# Patient Record
Sex: Female | Born: 1960
Health system: Southern US, Community
[De-identification: ages and names within clinical notes are randomized; demographics above are authoritative.]

## PROBLEM LIST (undated history)

## (undated) DIAGNOSIS — I639 Cerebral infarction, unspecified: Secondary | ICD-10-CM

## (undated) DIAGNOSIS — K219 Gastro-esophageal reflux disease without esophagitis: Secondary | ICD-10-CM

## (undated) DIAGNOSIS — I10 Essential (primary) hypertension: Secondary | ICD-10-CM

## (undated) HISTORY — DX: Gastro-esophageal reflux disease without esophagitis: K21.9

## (undated) HISTORY — DX: Cerebral infarction, unspecified: I63.9

---

## 2005-11-08 DIAGNOSIS — I639 Cerebral infarction, unspecified: Secondary | ICD-10-CM

## 2005-11-08 HISTORY — DX: Cerebral infarction, unspecified: I63.9

## 2008-11-08 HISTORY — PX: TRIGGER FINGER RELEASE: SHX641

## 2013-11-08 HISTORY — PX: FOOT SURGERY: SHX648

## 2015-06-04 ENCOUNTER — Emergency Department (HOSPITAL_COMMUNITY)
Admission: EM | Admit: 2015-06-04 | Discharge: 2015-06-04 | Disposition: A | Discharge status: Home / Self Care | Payer: 59 | Attending: Emergency Medicine | Admitting: Emergency Medicine

## 2015-06-04 ENCOUNTER — Encounter (HOSPITAL_COMMUNITY): Payer: Self-pay | Admitting: *Deleted

## 2015-06-04 DIAGNOSIS — R51 Headache: Secondary | ICD-10-CM | POA: Insufficient documentation

## 2015-06-04 DIAGNOSIS — Z79899 Other long term (current) drug therapy: Secondary | ICD-10-CM | POA: Diagnosis not present

## 2015-06-04 DIAGNOSIS — I10 Essential (primary) hypertension: Secondary | ICD-10-CM

## 2015-06-04 LAB — BASIC METABOLIC PANEL
Anion gap: 6 (ref 5–15)
BUN: 17 mg/dL (ref 6–20)
CO2: 25 mmol/L (ref 22–32)
CREATININE: 0.72 mg/dL (ref 0.44–1.00)
Calcium: 9.4 mg/dL (ref 8.9–10.3)
Chloride: 107 mmol/L (ref 101–111)
GLUCOSE: 88 mg/dL (ref 65–99)
POTASSIUM: 4.3 mmol/L (ref 3.5–5.1)
Sodium: 138 mmol/L (ref 135–145)

## 2015-06-04 LAB — CBC
HCT: 36.9 % (ref 36.0–46.0)
HEMOGLOBIN: 12.3 g/dL (ref 12.0–15.0)
MCH: 31.9 pg (ref 26.0–34.0)
MCHC: 33.3 g/dL (ref 30.0–36.0)
MCV: 95.8 fL (ref 78.0–100.0)
PLATELETS: 257 10*3/uL (ref 150–400)
RBC: 3.85 MIL/uL — ABNORMAL LOW (ref 3.87–5.11)
RDW: 12.8 % (ref 11.5–15.5)
WBC: 5.8 10*3/uL (ref 4.0–10.5)

## 2015-06-04 MED ORDER — IRBESARTAN 150 MG PO TABS
150.0000 mg | ORAL_TABLET | Freq: Every day | ORAL | Status: DC
  Administered 2015-06-04: 150 mg via ORAL
  Filled 2015-06-04: qty 1

## 2015-06-04 MED ORDER — VALSARTAN-HYDROCHLOROTHIAZIDE 160-12.5 MG PO TABS: 1.0000 | ORAL_TABLET | Freq: Every day | ORAL | Status: DC

## 2015-06-04 MED ORDER — HYDROCHLOROTHIAZIDE 12.5 MG PO CAPS
12.5000 mg | ORAL_CAPSULE | Freq: Every day | ORAL | Status: DC
  Administered 2015-06-04: 12.5 mg via ORAL
  Filled 2015-06-04: qty 1

## 2015-06-04 MED ORDER — ACETAMINOPHEN 500 MG PO TABS
1000.0000 mg | ORAL_TABLET | Freq: Once | ORAL | Status: AC
  Administered 2015-06-04: 1000 mg via ORAL

## 2015-06-04 MED ORDER — VALSARTAN-HYDROCHLOROTHIAZIDE 160-12.5 MG PO TABS: 1.0000 | ORAL_TABLET | ORAL | Status: DC

## 2015-06-04 NOTE — Discharge Instructions (Signed)
°Emergency Department Resource Guide °1) Find a Doctor and Pay Out of Pocket °Although you won't have to find out who is covered by your insurance plan, it is a good idea to ask around and get recommendations. You will then need to call the office and see if the doctor you have chosen will accept you as a new patient and what types of options they offer for patients who are self-pay. Some doctors offer discounts or will set up payment plans for their patients who do not have insurance, but you will need to ask so you aren't surprised when you get to your appointment. ° °2) Contact Your Local Health Department °Not all health departments have doctors that can see patients for sick visits, but many do, so it is worth a call to see if yours does. If you don't know where your local health department is, you can check in your phone book. The CDC also has a tool to help you locate your state's health department, and many state websites also have listings of all of their local health departments. ° °3) Find a Walk-in Clinic °If your illness is not likely to be very severe or complicated, you may want to try a walk in clinic. These are popping up all over the country in pharmacies, drugstores, and shopping centers. They're usually staffed by nurse practitioners or physician assistants that have been trained to treat common illnesses and complaints. They're usually fairly quick and inexpensive. However, if you have serious medical issues or chronic medical problems, these are probably not your best option. ° °No Primary Care Doctor: °- Call [facility] at  832-8000 - they can help you locate a primary care doctor that  accepts your insurance, provides certain services, etc. °- Physician Referral Service- 1-800-533-3463 ° °Chronic Pain Problems: °Organization         Address  Phone   Notes  °[facility]  (336) 297-2271 Patients need to be referred by their primary care doctor.  ° °Medication  Assistance: °Organization         Address  Phone   Notes  °[place] [facility] [address] °[place], [place] [address] (336) 641-8030 --Must be a resident of [place] °-- Must have NO insurance coverage whatsoever (no Medicaid/ Medicare, etc.) °-- The pt. MUST have a primary care doctor that directs their care regularly and follows them in the community °  °[facility]  (866) 331-1348   °[employer]  (888) 892-1162   ° °Agencies that provide inexpensive medical care: °Organization         Address  Phone   Notes  °[facility]  (336) 832-8035   °[facility]    (336) 832-7272   °[facility] [address] °[place], [place] [address] (336) 832-4777   °[facility] [address], °[place] (336) 271-4999   °[facility]    (336) 373-0678   °[facility]    (336) 272-1050   °[facility] ° [address], [place] Phone:  (336) 832-4444, Fax:  (336) 832-4440 Hours of Operation:  9 am - 6 pm, M-F.  Also accepts Medicaid/Medicare and self-pay.  °[facility] ° [address], [place] Phone: (336) 832-3150, Fax: (336) 832-3151. Hours of Operation:  8:30 am - 5:30 pm, M-F.  Also accepts Medicaid and self-pay.  °[facility] [place] [address], [place] Phone: (336) 878-6027   °[facility] [address], [place], [place] (336)723-1848, Ext. 123 Mondays & Thursdays: 7-9 AM.  First 15 patients are seen on a first come, first serve basis. °  ° °Medicaid-accepting [place] Providers: ° °Organization         Address  Phone   Notes  °[facility] [address], [place] (336) 641-2100 Also accepts self-pay patients.  °[facility] [address], [place] ° (336) 856-9996   °[facility] [address], [place]  (336) 288-8857   °[facility] [address] [place] [address], [place] (336) 299-7000   °Veita Bland [address], [place]  ° (336) 373-1557 Only accepts [place] Access Medicaid patients after they have their name applied to their card.  ° °Self-Pay (no insurance) in [place]: ° °Organization         Address  Phone   Notes  °Sickle Cell Patients, [facility] [address], [place] (336) 832-1970   °[facility] [address], [place] (336) 832-4400   ° [facility] ° [address], [place] (336) 992-4800   °[facility]/Dr. Osei-Bonsu ° [address] [place] [address], [place] or [address], [place] (336) 841-8500 Phone number for both [place] and [place] locations is the same.  °[facility] [address], [place] (336) 299-0000   °[facility] [address] [place] [address], [place] or [address] (336) 852-7530 °(336) 878-2260   °[facility] [address], [place] (336) 350-1642, phone; (336) 294-5005, fax Sees patients 1st and 3rd Saturday of every month.  Must not qualify for public or private insurance (i.e. Medicaid, Medicare, [place] Health Choice, Veterans' Benefits) • Household income should be no more than 200% of the poverty level •The clinic cannot treat you if you are pregnant or think you are pregnant • Sexually transmitted diseases are not treated at the clinic.  ° ° °Dental Care: °Organization         Address  Phone  Notes  °[place] [facility] [address], [place] (336) 641-6152 Accepts children up to age 21 who are enrolled in Medicaid or [place] Health Choice; pregnant women with a Medicaid card; and children who have applied for Medicaid or [place] Health Choice, but were declined, whose parents can pay a reduced fee at time of service.  °[place]  [facility] [place]  [address], [place] (336) 641-7733 Accepts children up to age 21 who are enrolled in Medicaid or [place] Health Choice; pregnant women with a Medicaid card; and children who have applied for Medicaid or [place] Health Choice, but were declined, whose parents can pay a reduced fee at time of service.  °[facility] ° [address], [place] (336) 641-4533 Patients are seen by appointment only. Walk-ins are not accepted. [facility] will see patients 18 years of age and older. °Monday - Tuesday (8am-5pm) °Most Wednesdays (8:30-5pm) °$30 per visit, cash only  °[facility] ° [address], [place] (336) 641-4533 Patients are seen by appointment only. Walk-ins are not accepted. [facility] will see patients 18 years of age and older. °One   Wednesday Evening (Monthly: Volunteer Based).  $30 per visit, cash only  °[facility]  (919) 537-3737 for adults; Children under age 4, call Graduate Pediatric Dentistry at (919) [phone]. Children aged 4-14, please call (919) [phone] to request a pediatric application. ° Dental services are provided in all areas of dental care including fillings, crowns and bridges, complete and partial dentures, implants, gum treatment, root canals, and extractions. Preventive care is also provided. Treatment is provided to both adults and children. °Patients are selected via a lottery and there is often a waiting list. °  °[facility] [address], °[place] ° (336) 763-8833 [email or web address] °  °[facility] [address], [place], [place] (336)723-1848, Ext. 123 Second and Fourth Thursday of each month, opens at 6:30 AM; Clinic ends at 9 AM.  Patients are seen on a first-come first-served basis, and a limited number are seen during each clinic.  ° °[facility] ° [address], [place], [place] (336) 723-7904    Eligibility Requirements °You must have lived in [place], [place], or [place] counties for at least the last three months. °  You cannot be eligible for state or federal sponsored healthcare insurance, including Veterans Administration, Medicaid, or Medicare. °  You generally cannot be eligible for healthcare insurance through your employer.  °  How to apply: °Eligibility screenings are held every Tuesday and Wednesday afternoon from 1:00 pm until 4:00 pm. You do not need an appointment for the interview!  °[facility] [address], [place], [place] 336-631-2330   °[facility]  336-342-8273   °[facility]  336-703-3100   °[facility]  336-570-6415   ° °Behavioral Health Resources in the Community: °Intensive Outpatient Programs °Organization         Address  Phone  Notes  °[facility] [address], [place], [place] 336-878-6098   °[facility] [address], [place], [place] 336-832-9800   °[facility] [address], [place], [place] ° 336-882-2125   °[facility] [address],  °[place], [place] 1-800-853-5163 or 336-641-4981   °Substance Abuse Resources °Organization         Address  Phone  Notes  °[facility]  336-882-2125   °[facility]  336-784-9470   °[facility]  336-285-9073   °[facility]  336-845-3988   °Residential & Outpatient Substance Abuse Program  1-800-659-3381   °Psychological Services °Organization         Address  Phone  Notes  °[facility]  336- 832-9600   °[facility]  336- 378-7881   °[facility] [address], [place] 1-800-853-5163 or 336-641-4981   ° °Mobile Crisis Teams °Organization         Address  Phone  Notes  °[facility], Mobile Crisis Care Unit  1-877-626-1772   °Assertive °[facility] ° [address]  [place], [place] 336-834-9664   °Sharon DeEsch [address] °[place] 336-554-5454   ° °Self-Help/Support Groups °Organization         Address  Phone             Notes  °[facility] [place] - variety of support groups  336- 373-1402 Call for more information  °Narcotics Anonymous (NA), [facility] [address], °[place]   2 meetings at this location  ° °  Residential Treatment Programs °Organization         Address  Phone  Notes  °[facility] [address],    °[place]  1-866-801-8205   °[facility] ° [address], [place], [place] 704-293-8524   °[facility] [address], [place] 336-845-3988 Admissions: 8am-3pm M-F  °[facility] [address]    °[place], [place] 336-841-1104   °[facility] [address], [place], [place] 336-379-7146   °[facility] [address]  °[place], [place] 336-285-9073   °[facility] - Intensive Outpatient [address], [place], [place] 336-852-3033   °[facility] [address]  °[place], [place] 1-877-615-2722 or 336-784-9470   °[facility] [address] [place], [place] 336-227-7417 Accepts Medicaid  °[facility] [address]  °[place] 1-800-659-3381 Substance Abuse/Addiction Treatment  ° °[place] Behavioral Health Resources °Organization         Address  Phone  Notes  °[facility]  (888) 581-9988   °Julie Brannon, PhD [address] [place], [place]   (336) 349-5553 or (336) 951-0000   °[facility]   [address] °[place], [place] (336) 349-4454   °[facility] [address], [place], [place] (336) 342-8316 Insurance/Medicaid/sponsorship through [facility]  °[facility] [address]                                    [place], [place] (336) 342-8316 Therapy/tele-psych/case    °[facility] [address]  ° , [place] (336) 349-2233    °Dr. Arfeen  (336) 349-4544   °[facility] [place]  [employer] [place] [facility] 1) [address],  °2) [address], [place] °3)  [address], [place] (336) 349-3220 °(336) 342-7768 ° °(336) 342-8140   °[place] [facility] (336) 342-1394 or (336) 342-3537 (After Hours)    ° ° °

## 2015-06-04 NOTE — ED Provider Notes (Signed)
CSN: [id number]     Arrival date & time 06/04/15  1712 History   First MD Initiated Contact with Patient 06/04/15 1935     Chief Complaint  Patient presents with  . Hypertension  . Headache     (Consider location/radiation/quality/duration/timing/severity/associated sxs/prior Treatment) HPI  54 year old female presents with a gradually worsening headache for the past 4 days. Patient difficult does not get headaches and so she is concerned that her blood pressure is causing this. She is typically on valsartan-hydrochlorothiazide 160-12.5 mg but has not taken this in 3 weeks. She does reasonably moved from [place] and has not been able to refill her medicines. She is currently applying for Medicaid and does not have a doctor. Patient states the headache has been gradually worsening and seems to be more right-sided than left. Occasionally she has seen spots but none now. She went to [facility] and checked her blood pressure and it was 165/119. Typically her blood pressure is 381 systolic. Denies blurry vision now, denies chest pain, shortness of breath, or any other pain. No focal weakness. Has not taken anything for the headache. Currently it is a 7/10.  History reviewed. No pertinent past medical history. History reviewed. No pertinent past surgical history. No family history on file. History  Substance Use Topics  . Smoking status: Never Smoker   . Smokeless tobacco: Not on file  . Alcohol Use: Yes   OB History    No data available     Review of Systems  Constitutional: Negative for fever.  Eyes: Negative for visual disturbance.  Respiratory: Negative for shortness of breath.   Cardiovascular: Negative for chest pain.  Gastrointestinal: Negative for nausea and vomiting.  Neurological: Positive for headaches. Negative for dizziness, weakness, light-headedness and numbness.  All other systems reviewed and are negative.     Allergies  Hydrocortisone  Home Medications    Prior to Admission medications   Medication Sig Start Date End Date Taking? Authorizing Provider  Multiple Vitamin (MULTIVITAMIN WITH MINERALS) TABS tablet Take 1 tablet by mouth daily.   Yes Historical Provider, MD  PRESCRIPTION MEDICATION Take 1 tablet by mouth daily. "For ulcer."   Yes Historical Provider, MD  valsartan-hydrochlorothiazide (DIOVAN-HCT) 160-12.5 MG per tablet Take 1 tablet by mouth daily.   Yes Historical Provider, MD   BP 156/92 mmHg  Pulse 65  Temp(Src) 97.7 F (36.5 C) (Oral)  Resp 18  SpO2 98% Physical Exam  Constitutional: She is oriented to person, place, and time. She appears well-developed and well-nourished.  HENT:  Head: Normocephalic and atraumatic.  Right Ear: External ear normal.  Left Ear: External ear normal.  Nose: Nose normal.  Eyes: EOM are normal. Pupils are equal, round, and reactive to light. Right eye exhibits no discharge. Left eye exhibits no discharge.  Neck: Neck supple.  Cardiovascular: Normal rate, regular rhythm and normal heart sounds.   Pulmonary/Chest: Effort normal and breath sounds normal.  Abdominal: Soft. There is no tenderness.  Neurological: She is alert and oriented to person, place, and time.  CN 2-12 grossly intact. 5/5 strength in all 4 extremities. Grossly normal sensation. Normal finger to nose. Normal gait.  Skin: Skin is warm and dry.  Nursing note and vitals reviewed.   ED Course  Procedures (including critical care time) Labs Review Labs Reviewed  CBC - Abnormal; Notable for the following:    RBC 3.85 (*)    All other components within normal limits  BASIC METABOLIC PANEL    Imaging Review No  results found.   EKG Interpretation None      MDM   Final diagnoses:  Essential hypertension    Patient's headache feels significant better after oral Tylenol. Her headache appears benign, she has gradually worsening headache is not the worst headache of her life, no vomiting, and a normal neurologic  exam. I have extremely low suspicion for subarachnoid hemorrhage, meningitis, stroke, subdural/epidural hemorrhage, or other acute intracranial emergency. Likely her hypertension is putting a role in this. I will give her a refill of her chronic anti-hypertensive medicines and give her referral to the PCP.    Sherwood Gambler, MD 06/04/15 [id number]

## 2015-06-04 NOTE — ED Notes (Signed)
Pt reports being out of medication for a few days. Pt has inquired about an orange card but has to wait 45 days until it kicks. Pt denies chest pain or SOB.

## 2015-06-04 NOTE — ED Notes (Signed)
Pt complains of headache, hot flashes for the past 4 days. Pt states her BP was 165/119 when it was checked at [facility] today. Pt states she has not taken her regular BP medication in 3 weeks.

## 2015-07-31 ENCOUNTER — Emergency Department (HOSPITAL_COMMUNITY)
Admission: EM | Admit: 2015-07-31 | Discharge: 2015-07-31 | Disposition: A | Discharge status: Home / Self Care | Payer: 59 | Attending: Emergency Medicine | Admitting: Emergency Medicine

## 2015-07-31 ENCOUNTER — Encounter (HOSPITAL_COMMUNITY): Payer: Self-pay

## 2015-07-31 DIAGNOSIS — Z79899 Other long term (current) drug therapy: Secondary | ICD-10-CM | POA: Insufficient documentation

## 2015-07-31 DIAGNOSIS — I10 Essential (primary) hypertension: Secondary | ICD-10-CM | POA: Insufficient documentation

## 2015-07-31 DIAGNOSIS — R51 Headache: Secondary | ICD-10-CM | POA: Insufficient documentation

## 2015-07-31 LAB — I-STAT CHEM 8, ED
BUN: 18 mg/dL (ref 6–20)
CREATININE: 0.7 mg/dL (ref 0.44–1.00)
Calcium, Ion: 1.15 mmol/L (ref 1.12–1.23)
Chloride: 106 mmol/L (ref 101–111)
Glucose, Bld: 86 mg/dL (ref 65–99)
HCT: 41 % (ref 36.0–46.0)
HEMOGLOBIN: 13.9 g/dL (ref 12.0–15.0)
Potassium: 4.8 mmol/L (ref 3.5–5.1)
Sodium: 139 mmol/L (ref 135–145)
TCO2: 26 mmol/L (ref 0–100)

## 2015-07-31 MED ORDER — VALSARTAN-HYDROCHLOROTHIAZIDE 160-12.5 MG PO TABS: 1.0000 | ORAL_TABLET | Freq: Every day | ORAL | Status: DC

## 2015-07-31 NOTE — ED Provider Notes (Signed)
CSN: [id number]     Arrival date & time 07/31/15  1324 History  This chart was scribed for non-physician practitioner, Glendell Docker, NP, working with Charlesetta Shanks, MD by Evelene Croon, ED Scribe. This patient was seen in room [place] and the patient's care was started at 1:40 PM.    Chief Complaint  Patient presents with  . Headache  . Hypertension   The history is provided by the patient. No language interpreter was used.     HPI Comments:  Deanna Floyd is a 54 y.o. female with a history of HTN, who presents to the Emergency Department complaining of elevated BP. Pt ran out of her BP meds on 07/07/2015 and was unable to refill due to insurance issues;  states her medicaid card came yesterday. At this time pt is complaining of a moderate HA which she notes is typical when her BP is elevated.  She denies CP, and SOB. Pt also denies h/o DM. No alleviating factors noted.   History reviewed. No pertinent past medical history. History reviewed. No pertinent past surgical history. History reviewed. No pertinent family history. Social History  Substance Use Topics  . Smoking status: Never Smoker   . Smokeless tobacco: None  . Alcohol Use: Yes   OB History    No data available     Review of Systems  Constitutional:       +Hypertension  + Med Refill  Neurological: Positive for headaches.  All other systems reviewed and are negative.     Allergies  Hydrocortisone  Home Medications   Prior to Admission medications   Medication Sig Start Date End Date Taking? Authorizing Provider  Multiple Vitamin (MULTIVITAMIN WITH MINERALS) TABS tablet Take 1 tablet by mouth daily.    Historical Provider, MD  PRESCRIPTION MEDICATION Take 1 tablet by mouth daily. "For ulcer."    Historical Provider, MD  valsartan-hydrochlorothiazide (DIOVAN-HCT) 160-12.5 MG per tablet Take 1 tablet by mouth daily. 06/04/15   Sherwood Gambler, MD   BP 169/105 mmHg  Pulse 72  Temp(Src) 98.1 F (36.7 C)  (Oral)  Resp 18  SpO2 99% Physical Exam  Constitutional: She is oriented to person, place, and time. She appears well-developed and well-nourished. No distress.  HENT:  Head: Normocephalic and atraumatic.  Eyes: Conjunctivae are normal.  Neck: Normal range of motion.  Cardiovascular: Normal rate.   Pulmonary/Chest: Effort normal.  Musculoskeletal: Normal range of motion.  Neurological: She is alert and oriented to person, place, and time.  Skin: Skin is warm and dry.  Nursing note and vitals reviewed.   ED Course  Procedures   DIAGNOSTIC STUDIES:  Oxygen Saturation is 99% on RA, normal by my interpretation.    COORDINATION OF CARE:  1:43 PM Discussed treatment plan with pt at bedside and pt agreed to plan.  Labs Review Labs Reviewed  I-STAT CHEM 8, ED    Imaging Review No results found. I have personally reviewed and evaluated these images and lab results as part of my medical decision-making.   EKG Interpretation None      MDM   Final diagnoses:  Essential hypertension    Medication refilled. Neurologically intact. No cp or sob  I personally performed the services described in this documentation, which was scribed in my presence. The recorded information has been reviewed and is accurate.    Glendell Docker, NP 07/31/15 1504  Charlesetta Shanks, MD 08/05/15 [phone]

## 2015-07-31 NOTE — Discharge Instructions (Signed)

## 2015-07-31 NOTE — ED Notes (Signed)
Pt states headache and hypertension.  Pt is out of bp meds.  Has been out since Aug 29th d/t insurance.  Pt gets headaches when bp is elevated. Checked at [employer] [facility]

## 2015-08-18 ENCOUNTER — Ambulatory Visit: Payer: 59

## 2015-08-22 ENCOUNTER — Ambulatory Visit: Payer: 59 | Attending: Internal Medicine

## 2015-09-10 ENCOUNTER — Emergency Department (INDEPENDENT_AMBULATORY_CARE_PROVIDER_SITE_OTHER)
Admission: EM | Admit: 2015-09-10 | Discharge: 2015-09-10 | Disposition: A | Discharge status: Home / Self Care | Payer: No Typology Code available for payment source | Source: Home / Self Care | Attending: Family Medicine | Admitting: Family Medicine

## 2015-09-10 ENCOUNTER — Encounter (HOSPITAL_COMMUNITY): Payer: Self-pay | Admitting: *Deleted

## 2015-09-10 DIAGNOSIS — I10 Essential (primary) hypertension: Secondary | ICD-10-CM

## 2015-09-10 HISTORY — DX: Essential (primary) hypertension: I10

## 2015-09-10 LAB — POCT I-STAT, CHEM 8
BUN: 17 mg/dL (ref 6–20)
CHLORIDE: 108 mmol/L (ref 101–111)
CREATININE: 0.9 mg/dL (ref 0.44–1.00)
Calcium, Ion: 1.28 mmol/L — ABNORMAL HIGH (ref 1.12–1.23)
Glucose, Bld: 106 mg/dL — ABNORMAL HIGH (ref 65–99)
HEMATOCRIT: 38 % (ref 36.0–46.0)
HEMOGLOBIN: 12.9 g/dL (ref 12.0–15.0)
POTASSIUM: 3.9 mmol/L (ref 3.5–5.1)
Sodium: 142 mmol/L (ref 135–145)
TCO2: 23 mmol/L (ref 0–100)

## 2015-09-10 MED ORDER — VALSARTAN-HYDROCHLOROTHIAZIDE 160-12.5 MG PO TABS: 1.0000 | ORAL_TABLET | Freq: Every day | ORAL | Status: DC

## 2015-09-10 NOTE — Discharge Instructions (Signed)
See your doctor for further refills. °

## 2015-09-10 NOTE — ED Provider Notes (Signed)
CSN: [id number]     Arrival date & time 09/10/15  1555 History   First MD Initiated Contact with Patient 09/10/15 1648     Chief Complaint  Patient presents with  . Medication Refill   (Consider location/radiation/quality/duration/timing/severity/associated sxs/prior Treatment) Patient is a 54 y.o. female presenting with hypertension. The history is provided by the patient.  Hypertension This is a new problem. The current episode started more than 1 week ago (no med for follow-up care of bp and meds.). The problem has been gradually worsening. Pertinent negatives include no chest pain, no abdominal pain and no headaches.    Past Medical History  Diagnosis Date  . Hypertension    History reviewed. No pertinent past surgical history. History reviewed. No pertinent family history. Social History  Substance Use Topics  . Smoking status: Never Smoker   . Smokeless tobacco: None  . Alcohol Use: Yes   OB History    No data available     Review of Systems  Constitutional: Negative.   HENT: Negative.   Respiratory: Negative.   Cardiovascular: Negative.  Negative for chest pain and leg swelling.  Gastrointestinal: Negative for abdominal pain.  Neurological: Negative.  Negative for headaches.  All other systems reviewed and are negative.   Allergies  Hydrocortisone  Home Medications   Prior to Admission medications   Medication Sig Start Date End Date Taking? Authorizing Provider  Multiple Vitamin (MULTIVITAMIN WITH MINERALS) TABS tablet Take 1 tablet by mouth daily.    Historical Provider, MD  PRESCRIPTION MEDICATION Take 1 tablet by mouth daily. "For ulcer."    Historical Provider, MD  valsartan-hydrochlorothiazide (DIOVAN-HCT) 160-12.5 MG tablet Take 1 tablet by mouth daily. 09/10/15   Billy Fischer, MD   Meds Ordered and Administered this Visit  Medications - No data to display  BP 158/96 mmHg  Pulse 78  Temp(Src) 98.6 F (37 C) (Oral)  Resp 18  SpO2 100% No data  found.   Physical Exam  Constitutional: She is oriented to person, place, and time. She appears well-developed and well-nourished. No distress.  HENT:  Mouth/Throat: Oropharynx is clear and moist.  Eyes: Pupils are equal, round, and reactive to light.  Neck: Normal range of motion. Neck supple.  Cardiovascular: Normal rate, normal heart sounds and intact distal pulses.   Pulmonary/Chest: Effort normal and breath sounds normal.  Neurological: She is alert and oriented to person, place, and time.  Skin: Skin is warm and dry.  Nursing note and vitals reviewed.   ED Course  Procedures (including critical care time)  Labs Review Labs Reviewed  POCT I-STAT, CHEM 8 - Abnormal; Notable for the following:    Glucose, Bld 106 (*)    Calcium, Ion 1.28 (*)    All other components within normal limits   i-stat reviewed. Imaging Review No results found.   Visual Acuity Review  Right Eye Distance:   Left Eye Distance:   Bilateral Distance:    Right Eye Near:   Left Eye Near:    Bilateral Near:         MDM   1. Essential hypertension        Billy Fischer, MD 09/10/15 1737

## 2015-09-10 NOTE — ED Notes (Signed)
Pt  Is out  Of    Her bp  meds has not had  In  About  1  Month       She reports  Pressure sensation   In her  Heads        She ambulated  To  Room with a  Slow  Steady  Gait

## 2015-10-06 ENCOUNTER — Ambulatory Visit (INDEPENDENT_AMBULATORY_CARE_PROVIDER_SITE_OTHER): Payer: No Typology Code available for payment source | Admitting: Family Medicine

## 2015-10-06 ENCOUNTER — Encounter: Payer: Self-pay | Admitting: Family Medicine

## 2015-10-06 VITALS — BP 142/90 | HR 79 | Temp 97.7°F | Ht 62.0 in | Wt 207.0 lb

## 2015-10-06 DIAGNOSIS — M545 Low back pain, unspecified: Secondary | ICD-10-CM | POA: Insufficient documentation

## 2015-10-06 DIAGNOSIS — Z7689 Persons encountering health services in other specified circumstances: Secondary | ICD-10-CM

## 2015-10-06 DIAGNOSIS — M25579 Pain in unspecified ankle and joints of unspecified foot: Secondary | ICD-10-CM

## 2015-10-06 DIAGNOSIS — M5442 Lumbago with sciatica, left side: Secondary | ICD-10-CM

## 2015-10-06 DIAGNOSIS — I10 Essential (primary) hypertension: Secondary | ICD-10-CM

## 2015-10-06 DIAGNOSIS — Z0000 Encounter for general adult medical examination without abnormal findings: Secondary | ICD-10-CM

## 2015-10-06 DIAGNOSIS — M25571 Pain in right ankle and joints of right foot: Secondary | ICD-10-CM

## 2015-10-06 DIAGNOSIS — Z7189 Other specified counseling: Secondary | ICD-10-CM

## 2015-10-06 LAB — COMPLETE METABOLIC PANEL WITH GFR
ALT: 15 U/L (ref 6–29)
AST: 15 U/L (ref 10–35)
Albumin: 4.6 g/dL (ref 3.6–5.1)
Alkaline Phosphatase: 65 U/L (ref 33–130)
BILIRUBIN TOTAL: 0.4 mg/dL (ref 0.2–1.2)
BUN: 20 mg/dL (ref 7–25)
CALCIUM: 9.6 mg/dL (ref 8.6–10.4)
CO2: 25 mmol/L (ref 20–31)
Chloride: 104 mmol/L (ref 98–110)
Creat: 0.69 mg/dL (ref 0.50–1.05)
GFR, Est African American: >89 mL/min (ref >=60)
GFR, Est Non African American: >89 mL/min (ref >=60)
Glucose, Bld: 92 mg/dL (ref 65–99)
Potassium: 4.3 mmol/L (ref 3.5–5.3)
Sodium: 138 mmol/L (ref 135–146)
Total Protein: 7.5 g/dL (ref 6.1–8.1)

## 2015-10-06 LAB — LIPID PANEL
Cholesterol: 198 mg/dL (ref 125–200)
HDL: 96 mg/dL (ref >=46)
LDL Cholesterol: 86 mg/dL (ref <130)
Total CHOL/HDL Ratio: 2.1 Ratio (ref <=5.0)
Triglycerides: 78 mg/dL (ref <150)
VLDL: 16 mg/dL (ref <30)

## 2015-10-06 LAB — CBC WITH DIFFERENTIAL/PLATELET
BASOS ABS: 0 10*3/uL (ref 0.0–0.1)
Basophils Relative: 0 % (ref 0–1)
EOS ABS: 0.1 10*3/uL (ref 0.0–0.7)
EOS PCT: 3 % (ref 0–5)
HCT: 36 % (ref 36.0–46.0)
Hemoglobin: 12.3 g/dL (ref 12.0–15.0)
Lymphocytes Relative: 38 % (ref 12–46)
Lymphs Abs: 1.8 10*3/uL (ref 0.7–4.0)
MCH: 32.5 pg (ref 26.0–34.0)
MCHC: 34.2 g/dL (ref 30.0–36.0)
MCV: 95 fL (ref 78.0–100.0)
MPV: 10.6 fL (ref 8.6–12.4)
Monocytes Absolute: 0.4 10*3/uL (ref 0.1–1.0)
Monocytes Relative: 8 % (ref 3–12)
Neutro Abs: 2.4 10*3/uL (ref 1.7–7.7)
Neutrophils Relative %: 51 % (ref 43–77)
PLATELETS: 275 10*3/uL (ref 150–400)
RBC: 3.79 MIL/uL — ABNORMAL LOW (ref 3.87–5.11)
RDW: 13.5 % (ref 11.5–15.5)
WBC: 4.7 10*3/uL (ref 4.0–10.5)

## 2015-10-06 LAB — TSH: TSH: 1.263 u[IU]/mL (ref 0.350–4.500)

## 2015-10-06 MED ORDER — VALSARTAN-HYDROCHLOROTHIAZIDE 320-12.5 MG PO TABS: 1.0000 | ORAL_TABLET | Freq: Every day | ORAL | Status: DC

## 2015-10-06 MED ORDER — DICLOFENAC SODIUM 75 MG PO TBEC: 75.0000 mg | DELAYED_RELEASE_TABLET | Freq: Two times a day (BID) | ORAL | Status: DC

## 2015-10-06 NOTE — Progress Notes (Signed)
Patient ID: Deanna Floyd, female   DOB: 1961-08-11, 54 y.o.   MRN: [id number]   Deanna Floyd, is a 54 y.o. female  [id number]  [id number]  DOB - 10/03/1961  CC:  Chief Complaint  Patient presents with  . New Patient    needs to get established and needs foot doctor for foot pain, is on disability for foot issue , feels anxoius at times and depressed       HPI: Deanna Floyd is a 54 y.o. female here to establish care. She has recently moved here from [place]. She was been seen in ED and/or urgent care for her hypertension. She is currently on Diovan 160/12.5 daily for hypertension. Her other major problem is with her feet. She had surgery on her foot in May, 2016 and has screws in place. She is needing a referral to podiatrist for follow-up. She also complains of left lower back pain with radiation and pain into the left hip and knee. She reports being unable to get a good nights rest due to the pain in her back, hips, knees and feet. Her original BP today was 154/100. A repeat was 142/90. She reports having a tetanus vaccine less than 5 years ago. She does desire a flu shot today. She is unsure about her colon cancer screening status. She reports having normal mammogram and PAP within the last year.  She denies tobacco use, drinks occassional wine and denies illicit drug use.   Allergies  Allergen Reactions  . Hydrocortisone Hives   Past Medical History  Diagnosis Date  . Hypertension    Current Outpatient Prescriptions on File Prior to Visit  Medication Sig Dispense Refill  . Multiple Vitamin (MULTIVITAMIN WITH MINERALS) TABS tablet Take 1 tablet by mouth daily.    Marland Kitchen PRESCRIPTION MEDICATION Take 1 tablet by mouth daily. "For ulcer."     No current facility-administered medications on file prior to visit.   Family History  Problem Relation Age of Onset  . Hypertension Sister    Social History   Social History  . Marital Status: Single    Spouse Name: N/A  . Number of  Children: N/A  . Years of Education: N/A   Occupational History  . Not on file.   Social History Main Topics  . Smoking status: Never Smoker   . Smokeless tobacco: Not on file  . Alcohol Use: Yes  . Drug Use: No  . Sexual Activity: No   Other Topics Concern  . Not on file   Social History Narrative    Review of Systems: Constitutional: Negative for fever, chills, appetite change, weight loss,  Fatigue. Skin: Negative for rashes or lesions of concern. HENT: Negative for ear pain, ear discharge.nose bleeds Eyes: Negative for pain, discharge, redness, itching and visual disturbance. Neck: Negative for pain, stiffness Respiratory: Negative for cough, shortness of breath,   Cardiovascular: Negative for chest pain, palpitations and leg swelling. Gastrointestinal: Negative for abdominal pain, nausea, vomiting, diarrhea, constipations Genitourinary: Negative for dysuria, urgency, frequency, hematuria,  Musculoskeletal: Negative for back pain, joint pain, joint  swelling, and gait problem.Negative for weakness. Neurological: Negative for dizziness, tremors, seizures, syncope,   light-headedness, numbness and headaches.  Hematological: Negative for easy bruising or bleeding Psychiatric/Behavioral: Negative for depression, anxiety, decreased concentration, confusion   Objective:   Filed Vitals:   10/06/15 1118  BP: 154/100  Pulse: 79  Temp: 97.7 F (36.5 C)    Physical Exam: Constitutional: Patient appears well-developed and well-nourished. No distress. HENT: Normocephalic, atraumatic,  External right and left ear normal. Oropharynx is clear and moist.  Eyes: Conjunctivae and EOM are normal. PERRLA, no scleral icterus. Neck: Normal ROM. Neck supple. No lymphadenopathy, No thyromegaly. CVS: RRR, S1/S2 +, no murmurs, no gallops, no rubs Pulmonary: Effort and breath sounds normal, no stridor, rhonchi, wheezes, rales.  Abdominal: Soft. Normoactive BS,, no distension, tenderness,  rebound or guarding.  Musculoskeletal: Normal range of motion. No edema and no tenderness.  Neuro: Alert.Normal muscle tone coordination. Non-focal Skin: Skin is warm and dry. No rash noted. Not diaphoretic. No erythema. No pallor. Psychiatric: Normal mood and affect. Behavior, judgment, thought content normal.  Lab Results  Component Value Date   WBC 5.8 06/04/2015   HGB 12.9 09/10/2015   HCT 38.0 09/10/2015   MCV 95.8 06/04/2015   PLT 257 06/04/2015   Lab Results  Component Value Date   CREATININE 0.90 09/10/2015   BUN 17 09/10/2015   NA 142 09/10/2015   K 3.9 09/10/2015   CL 108 09/10/2015   CO2 25 06/04/2015    No results found for: HGBA1C Lipid Panel  No results found for: CHOL, TRIG, HDL, CHOLHDL, VLDL, LDLCALC     Assessment and plan:   1. Accelerated hypertension  - COMPLETE METABOLIC PANEL WITH GFR - CBC with Differential - Lipid panel - TSH -Increase Diovan/HCT to 320/12.5, #90, one po q day with 3 refills.   2. Health care maintenance  - Vitamin D 1,25 dihydroxy - Hepatitis C Antibody - Flu Vaccine QUAD 36+ mos PF IM (Fluarix & Fluzone Quad PF)  3. Encounter to establish care I have reviewed information provided by the patient and that available in her [facility] chart -Will request records from primary provider and podiatrist in [place].  4. Musculoskletal pain (Left lower back pain, hip and leg pain on left and right foot pain -Voltaren 75 mg, #60, one po q day. 60 with one refill.    Return in about 3 months (around 01/06/2016) for HTN.  The patient was given clear instructions to go to ER or return to medical center if symptoms don't improve, worsen or new problems develop. The patient verbalized understanding.    Micheline Chapman FNP  10/06/2015, 12:16 PM

## 2015-10-06 NOTE — Patient Instructions (Addendum)
Once we get your records from doctors in [place], we will review and determine what else we may do for you Once we have your disability insurance information, we will see about a referral to orthopedist and to podiatrist. We need to increase your BP medication. Will send prescription to [facility] at [address] [place] I have provide information on weight loss and healthy life style. Return in 3 months, sooner if needed  tCalorie Counting for Weight Loss Calories are energy you get from the things you eat and drink. Your body uses this energy to keep you going throughout the day. The number of calories you eat affects your weight. When you eat more calories than your body needs, your body stores the extra calories as fat. When you eat fewer calories than your body needs, your body burns fat to get the energy it needs. Calorie counting means keeping track of how many calories you eat and drink each day. If you make sure to eat fewer calories than your body needs, you should lose weight. In order for calorie counting to work, you will need to eat the number of calories that are right for you in a day to lose a healthy amount of weight per week. A healthy amount of weight to lose per week is usually 1-2 lb (0.5-0.9 kg). A dietitian can determine how many calories you need in a day and give you suggestions on how to reach your calorie goal.  WHAT IS MY MY PLAN? My goal is to have ______1200____ calories per day.  If I have this many calories per day, I should lose around _______1-2___ pounds per week. WHAT DO I NEED TO KNOW ABOUT CALORIE COUNTING? In order to meet your daily calorie goal, you will need to:  Find out how many calories are in each food you would like to eat. Try to do this before you eat.  Decide how much of the food you can eat.  Write down what you ate and how many calories it had. Doing this is called keeping a food log. WHERE DO I FIND CALORIE  INFORMATION? The number of calories in a food can be found on a Nutrition Facts label. Note that all the information on a label is based on a specific serving of the food. If a food does not have a Nutrition Facts label, try to look up the calories online or ask your dietitian for help. HOW DO I DECIDE HOW MUCH TO EAT? To decide how much of the food you can eat, you will need to consider both the number of calories in one serving and the size of one serving. This information can be found on the Nutrition Facts label. If a food does not have a Nutrition Facts label, look up the information online or ask your dietitian for help. Remember that calories are listed per serving. If you choose to have more than one serving of a food, you will have to multiply the calories per serving by the amount of servings you plan to eat. For example, the label on a package of bread might say that a serving size is 1 slice and that there are 90 calories in a serving. If you eat 1 slice, you will have eaten 90 calories. If you eat 2 slices, you will have eaten 180 calories. HOW DO I KEEP A FOOD LOG? After each meal, record the following information in your food log:  What you ate.  How much of it you ate.  How many calories it had.  Then, add up your calories. Keep your food log near you, such as in a small notebook in your pocket. Another option is to use a mobile app or website. Some programs will calculate calories for you and show you how many calories you have left each time you add an item to the log. WHAT ARE SOME CALORIE COUNTING TIPS?  Use your calories on foods and drinks that will fill you up and not leave you hungry. Some examples of this include foods like nuts and nut butters, vegetables, lean proteins, and high-fiber foods (more than 5 g fiber per serving).  Eat nutritious foods and avoid empty calories. Empty calories are calories you get from foods or beverages that do not have many nutrients, such  as candy and soda. It is better to have a nutritious high-calorie food (such as an avocado) than a food with few nutrients (such as a bag of chips).  Know how many calories are in the foods you eat most often. This way, you do not have to look up how many calories they have each time you eat them.  Look out for foods that may seem like low-calorie foods but are really high-calorie foods, such as baked goods, soda, and fat-free candy.  Pay attention to calories in drinks. Drinks such as sodas, specialty coffee drinks, alcohol, and juices have a lot of calories yet do not fill you up. Choose low-calorie drinks like water and diet drinks.  Focus your calorie counting efforts on higher calorie items. Logging the calories in a garden salad that contains only vegetables is less important than calculating the calories in a milk shake.  Find a way of tracking calories that works for you. Get creative. Most people who are successful find ways to keep track of how much they eat in a day, even if they do not count every calorie. WHAT ARE SOME PORTION CONTROL TIPS?  Know how many calories are in a serving. This will help you know how many servings of a certain food you can have.  Use a measuring cup to measure serving sizes. This is helpful when you start out. With time, you will be able to estimate serving sizes for some foods.  Take some time to put servings of different foods on your favorite plates, bowls, and cups so you know what a serving looks like.  Try not to eat straight from a bag or box. Doing this can lead to overeating. Put the amount you would like to eat in a cup or on a plate to make sure you are eating the right portion.  Use smaller plates, glasses, and bowls to prevent overeating. This is a quick and easy way to practice portion control. If your plate is smaller, less food can fit on it.  Try not to multitask while eating, such as watching TV or using your computer. If it is time to  eat, sit down at a table and enjoy your food. Doing this will help you to start recognizing when you are full. It will also make you more aware of what and how much you are eating. HOW CAN I CALORIE COUNT WHEN EATING OUT?  Ask for smaller portion sizes or child-sized portions.  Consider sharing an entree and sides instead of getting your own entree.  If you get your own entree, eat only half. Ask for a box at the beginning of your meal and  put the rest of your entree in it so you are not tempted to eat it.  Look for the calories on the menu. If calories are listed, choose the lower calorie options.  Choose dishes that include vegetables, fruits, whole grains, low-fat dairy products, and lean protein. Focusing on smart food choices from each of the 5 food groups can help you stay on track at restaurants.  Choose items that are boiled, broiled, grilled, or steamed.  Choose water, milk, unsweetened iced tea, or other drinks without added sugars. If you want an alcoholic beverage, choose a lower calorie option. For example, a regular margarita can have up to 700 calories and a glass of wine has around 150.  Stay away from items that are buttered, battered, fried, or served with cream sauce. Items labeled "crispy" are usually fried, unless stated otherwise.  Ask for dressings, sauces, and syrups on the side. These are usually very high in calories, so do not eat much of them.  Watch out for salads. Many people think salads are a healthy option, but this is often not the case. Many salads come with bacon, fried chicken, lots of cheese, fried chips, and dressing. All of these items have a lot of calories. If you want a salad, choose a garden salad and ask for grilled meats or steak. Ask for the dressing on the side, or ask for olive oil and vinegar or lemon to use as dressing.  Estimate how many servings of a food you are given. For example, a serving of cooked rice is  cup or about the size of half  a tennis ball or one cupcake wrapper. Knowing serving sizes will help you be aware of how much food you are eating at restaurants. The list below tells you how big or small some common portion sizes are based on everyday objects.  1 oz--4 stacked dice.  3 oz--1 deck of cards.  1 tsp--1 dice.  1 Tbsp-- a Ping-Pong ball.  2 Tbsp--1 Ping-Pong ball.   cup--1 tennis ball or 1 cupcake wrapper.  1 cup--1 baseball.   This information is not intended to replace advice given to you by your health care provider. Make sure you discuss any questions you have with your health care provider.   Document Released: 10/25/2005 Document Revised: 11/15/2014 Document Reviewed: 08/30/2013 Elsevier Interactive Patient Education 2016 [place] for [employer] Loss Calories are energy you get from the things you eat and drink. Your body uses this energy to keep you going throughout the day. The number of calories you eat affects your weight. When you eat more calories than your body needs, your body stores the extra calories as fat. When you eat fewer calories than your body needs, your body burns fat to get the energy it needs. Calorie counting means keeping track of how many calories you eat and drink each day. If you make sure to eat fewer calories than your body needs, you should lose weight. In order for calorie counting to work, you will need to eat the number of calories that are right for you in a day to lose a healthy amount of weight per week. A healthy amount of weight to lose per week is usually 1-2 lb (0.5-0.9 kg). A dietitian can determine how many calories you need in a day and give you suggestions on how to reach your calorie goal.  WHAT IS MY MY PLAN? My goal is to have __________ calories per day.  If I  have this many calories per day, I should lose around __________ pounds per week. WHAT DO I NEED TO KNOW ABOUT CALORIE COUNTING? In order to meet your daily calorie goal, you  will need to:  Find out how many calories are in each food you would like to eat. Try to do this before you eat.  Decide how much of the food you can eat.  Write down what you ate and how many calories it had. Doing this is called keeping a food log. WHERE DO I FIND CALORIE INFORMATION? The number of calories in a food can be found on a Nutrition Facts label. Note that all the information on a label is based on a specific serving of the food. If a food does not have a Nutrition Facts label, try to look up the calories online or ask your dietitian for help. HOW DO I DECIDE HOW MUCH TO EAT? To decide how much of the food you can eat, you will need to consider both the number of calories in one serving and the size of one serving. This information can be found on the Nutrition Facts label. If a food does not have a Nutrition Facts label, look up the information online or ask your dietitian for help. Remember that calories are listed per serving. If you choose to have more than one serving of a food, you will have to multiply the calories per serving by the amount of servings you plan to eat. For example, the label on a package of bread might say that a serving size is 1 slice and that there are 90 calories in a serving. If you eat 1 slice, you will have eaten 90 calories. If you eat 2 slices, you will have eaten 180 calories. HOW DO I KEEP A FOOD LOG? After each meal, record the following information in your food log:  What you ate.  How much of it you ate.  How many calories it had.  Then, add up your calories. Keep your food log near you, such as in a small notebook in your pocket. Another option is to use a mobile app or website. Some programs will calculate calories for you and show you how many calories you have left each time you add an item to the log. WHAT ARE SOME CALORIE COUNTING TIPS?  Use your calories on foods and drinks that will fill you up and not leave you hungry. Some examples  of this include foods like nuts and nut butters, vegetables, lean proteins, and high-fiber foods (more than 5 g fiber per serving).  Eat nutritious foods and avoid empty calories. Empty calories are calories you get from foods or beverages that do not have many nutrients, such as candy and soda. It is better to have a nutritious high-calorie food (such as an avocado) than a food with few nutrients (such as a bag of chips).  Know how many calories are in the foods you eat most often. This way, you do not have to look up how many calories they have each time you eat them.  Look out for foods that may seem like low-calorie foods but are really high-calorie foods, such as baked goods, soda, and fat-free candy.  Pay attention to calories in drinks. Drinks such as sodas, specialty coffee drinks, alcohol, and juices have a lot of calories yet do not fill you up. Choose low-calorie drinks like water and diet drinks.  Focus your calorie counting efforts on higher calorie items. Logging  the calories in a garden salad that contains only vegetables is less important than calculating the calories in a milk shake.  Find a way of tracking calories that works for you. Get creative. Most people who are successful find ways to keep track of how much they eat in a day, even if they do not count every calorie. WHAT ARE SOME PORTION CONTROL TIPS?  Know how many calories are in a serving. This will help you know how many servings of a certain food you can have.  Use a measuring cup to measure serving sizes. This is helpful when you start out. With time, you will be able to estimate serving sizes for some foods.  Take some time to put servings of different foods on your favorite plates, bowls, and cups so you know what a serving looks like.  Try not to eat straight from a bag or box. Doing this can lead to overeating. Put the amount you would like to eat in a cup or on a plate to make sure you are eating the right  portion.  Use smaller plates, glasses, and bowls to prevent overeating. This is a quick and easy way to practice portion control. If your plate is smaller, less food can fit on it.  Try not to multitask while eating, such as watching TV or using your computer. If it is time to eat, sit down at a table and enjoy your food. Doing this will help you to start recognizing when you are full. It will also make you more aware of what and how much you are eating. HOW CAN I CALORIE COUNT WHEN EATING OUT?  Ask for smaller portion sizes or child-sized portions.  Consider sharing an entree and sides instead of getting your own entree.  If you get your own entree, eat only half. Ask for a box at the beginning of your meal and put the rest of your entree in it so you are not tempted to eat it.  Look for the calories on the menu. If calories are listed, choose the lower calorie options.  Choose dishes that include vegetables, fruits, whole grains, low-fat dairy products, and lean protein. Focusing on smart food choices from each of the 5 food groups can help you stay on track at restaurants.  Choose items that are boiled, broiled, grilled, or steamed.  Choose water, milk, unsweetened iced tea, or other drinks without added sugars. If you want an alcoholic beverage, choose a lower calorie option. For example, a regular margarita can have up to 700 calories and a glass of wine has around 150.  Stay away from items that are buttered, battered, fried, or served with cream sauce. Items labeled "crispy" are usually fried, unless stated otherwise.  Ask for dressings, sauces, and syrups on the side. These are usually very high in calories, so do not eat much of them.  Watch out for salads. Many people think salads are a healthy option, but this is often not the case. Many salads come with bacon, fried chicken, lots of cheese, fried chips, and dressing. All of these items have a lot of calories. If you want a salad,  choose a garden salad and ask for grilled meats or steak. Ask for the dressing on the side, or ask for olive oil and vinegar or lemon to use as dressing.  Estimate how many servings of a food you are given. For example, a serving of cooked rice is  cup or about the size  of half a tennis ball or one cupcake wrapper. Knowing serving sizes will help you be aware of how much food you are eating at restaurants. The list below tells you how big or small some common portion sizes are based on everyday objects.  1 oz--4 stacked dice.  3 oz--1 deck of cards.  1 tsp--1 dice.  1 Tbsp-- a Ping-Pong ball.  2 Tbsp--1 Ping-Pong ball.   cup--1 tennis ball or 1 cupcake wrapper.  1 cup--1 baseball.   This information is not intended to replace advice given to you by your health care provider. Make sure you discuss any questions you have with your health care provider.   Document Released: 10/25/2005 Document Revised: 11/15/2014 Document Reviewed: 08/30/2013 Elsevier Interactive Patient Education 2016 [place] Maintenance, Female Adopting a healthy lifestyle and getting preventive care can go a long way to promote health and wellness. Talk with your health care provider about what schedule of regular examinations is right for you. This is a good chance for you to check in with your provider about disease prevention and staying healthy. In between checkups, there are plenty of things you can do on your own. Experts have done a lot of research about which lifestyle changes and preventive measures are most likely to keep you healthy. Ask your health care provider for more information. WEIGHT AND DIET  Eat a healthy diet  Be sure to include plenty of vegetables, fruits, low-fat dairy products, and lean protein.  Do not eat a lot of foods high in solid fats, added sugars, or salt.  Get regular exercise. This is one of the most important things you can do for your health.  Most adults should  exercise for at least 150 minutes each week. The exercise should increase your heart rate and make you sweat (moderate-intensity exercise).  Most adults should also do strengthening exercises at least twice a week. This is in addition to the moderate-intensity exercise.  Maintain a healthy weight  Body mass index (BMI) is a measurement that can be used to identify possible weight problems. It estimates body fat based on height and weight. Your health care provider can help determine your BMI and help you achieve or maintain a healthy weight.  For females 63 years of age and older:   A BMI below 18.5 is considered underweight.  A BMI of 18.5 to 24.9 is normal.  A BMI of 25 to 29.9 is considered overweight.  A BMI of 30 and above is considered obese.  Watch levels of cholesterol and blood lipids  You should start having your blood tested for lipids and cholesterol at 55 years of age, then have this test every 5 years.  You may need to have your cholesterol levels checked more often if:  Your lipid or cholesterol levels are high.  You are older than 55 years of age.  You are at high risk for heart disease.  CANCER SCREENING   Lung Cancer  Lung cancer screening is recommended for adults 19-58 years old who are at high risk for lung cancer because of a history of smoking.  A yearly low-dose CT scan of the lungs is recommended for people who:  Currently smoke.  Have quit within the past 15 years.  Have at least a 30-pack-year history of smoking. A pack year is smoking an average of one pack of cigarettes a day for 1 year.  Yearly screening should continue until it has been 15 years since you quit.  Yearly screening should stop if you develop a health problem that would prevent you from having lung cancer treatment.  Breast Cancer  Practice breast self-awareness. This means understanding how your breasts normally appear and feel.  It also means doing regular breast  self-exams. Let your health care provider know about any changes, no matter how small.  If you are in your 20s or 30s, you should have a clinical breast exam (CBE) by a health care provider every 1-3 years as part of a regular health exam.  If you are 12 or older, have a CBE every year. Also consider having a breast X-ray (mammogram) every year.  If you have a family history of breast cancer, talk to your health care provider about genetic screening.  If you are at high risk for breast cancer, talk to your health care provider about having an MRI and a mammogram every year.  Breast cancer gene (BRCA) assessment is recommended for women who have family members with BRCA-related cancers. BRCA-related cancers include:  Breast.  Ovarian.  Tubal.  Peritoneal cancers.  Results of the assessment will determine the need for genetic counseling and BRCA1 and BRCA2 testing. Cervical Cancer Your health care provider may recommend that you be screened regularly for cancer of the pelvic organs (ovaries, uterus, and vagina). This screening involves a pelvic examination, including checking for microscopic changes to the surface of your cervix (Pap test). You may be encouraged to have this screening done every 3 years, beginning at age 50.  For women ages 33-65, health care providers may recommend pelvic exams and Pap testing every 3 years, or they may recommend the Pap and pelvic exam, combined with testing for human papilloma virus (HPV), every 5 years. Some types of HPV increase your risk of cervical cancer. Testing for HPV may also be done on women of any age with unclear Pap test results.  Other health care providers may not recommend any screening for nonpregnant women who are considered low risk for pelvic cancer and who do not have symptoms. Ask your health care provider if a screening pelvic exam is right for you.  If you have had past treatment for cervical cancer or a condition that could lead  to cancer, you need Pap tests and screening for cancer for at least 20 years after your treatment. If Pap tests have been discontinued, your risk factors (such as having a new sexual partner) need to be reassessed to determine if screening should resume. Some women have medical problems that increase the chance of getting cervical cancer. In these cases, your health care provider may recommend more frequent screening and Pap tests. Colorectal Cancer  This type of cancer can be detected and often prevented.  Routine colorectal cancer screening usually begins at 55 years of age and continues through 55 years of age.  Your health care provider may recommend screening at an earlier age if you have risk factors for colon cancer.  Your health care provider may also recommend using home test kits to check for hidden blood in the stool.  A small camera at the end of a tube can be used to examine your colon directly (sigmoidoscopy or colonoscopy). This is done to check for the earliest forms of colorectal cancer.  Routine screening usually begins at age 25.  Direct examination of the colon should be repeated every 5-10 years through 55 years of age. However, you may need to be screened more often if early forms of precancerous polyps or  small growths are found. Skin Cancer  Check your skin from head to toe regularly.  Tell your health care provider about any new moles or changes in moles, especially if there is a change in a mole's shape or color.  Also tell your health care provider if you have a mole that is larger than the size of a pencil eraser.  Always use sunscreen. Apply sunscreen liberally and repeatedly throughout the day.  Protect yourself by wearing long sleeves, pants, a wide-brimmed hat, and sunglasses whenever you are outside. HEART DISEASE, DIABETES, AND HIGH BLOOD PRESSURE   High blood pressure causes heart disease and increases the risk of stroke. High blood pressure is more  likely to develop in:  People who have blood pressure in the high end of the normal range (130-139/85-89 mm Hg).  People who are overweight or obese.  People who are African American.  If you are 32-64 years of age, have your blood pressure checked every 3-5 years. If you are 75 years of age or older, have your blood pressure checked every year. You should have your blood pressure measured twice--once when you are at a hospital or clinic, and once when you are not at a hospital or clinic. Record the average of the two measurements. To check your blood pressure when you are not at a hospital or clinic, you can use:  An automated blood pressure machine at a pharmacy.  A home blood pressure monitor.  If you are between 37 years and 81 years old, ask your health care provider if you should take aspirin to prevent strokes.  Have regular diabetes screenings. This involves taking a blood sample to check your fasting blood sugar level.  If you are at a normal weight and have a low risk for diabetes, have this test once every three years after 55 years of age.  If you are overweight and have a high risk for diabetes, consider being tested at a younger age or more often. PREVENTING INFECTION  Hepatitis B  If you have a higher risk for hepatitis B, you should be screened for this virus. You are considered at high risk for hepatitis B if:  You were born in a country where hepatitis B is common. Ask your health care provider which countries are considered high risk.  Your parents were born in a high-risk country, and you have not been immunized against hepatitis B (hepatitis B vaccine).  You have HIV or AIDS.  You use needles to inject street drugs.  You live with someone who has hepatitis B.  You have had sex with someone who has hepatitis B.  You get hemodialysis treatment.  You take certain medicines for conditions, including cancer, organ transplantation, and autoimmune  conditions. Hepatitis C  Blood testing is recommended for:  Everyone born from [birth year] through 1966.  Anyone with known risk factors for hepatitis C. Sexually transmitted infections (STIs)  You should be screened for sexually transmitted infections (STIs) including gonorrhea and chlamydia if:  You are sexually active and are younger than 55 years of age.  You are older than 55 years of age and your health care provider tells you that you are at risk for this type of infection.  Your sexual activity has changed since you were last screened and you are at an increased risk for chlamydia or gonorrhea. Ask your health care provider if you are at risk.  If you do not have HIV, but are at risk, it may be  recommended that you take a prescription medicine daily to prevent HIV infection. This is called pre-exposure prophylaxis (PrEP). You are considered at risk if:  You are sexually active and do not regularly use condoms or know the HIV status of your partner(s).  You take drugs by injection.  You are sexually active with a partner who has HIV. Talk with your health care provider about whether you are at high risk of being infected with HIV. If you choose to begin PrEP, you should first be tested for HIV. You should then be tested every 3 months for as long as you are taking PrEP.  PREGNANCY   If you are premenopausal and you may become pregnant, ask your health care provider about preconception counseling.  If you may become pregnant, take 400 to 800 micrograms (mcg) of folic acid every day.  If you want to prevent pregnancy, talk to your health care provider about birth control (contraception). OSTEOPOROSIS AND MENOPAUSE   Osteoporosis is a disease in which the bones lose minerals and strength with aging. This can result in serious bone fractures. Your risk for osteoporosis can be identified using a bone density scan.  If you are 26 years of age or older, or if you are at risk for  osteoporosis and fractures, ask your health care provider if you should be screened.  Ask your health care provider whether you should take a calcium or vitamin D supplement to lower your risk for osteoporosis.  Menopause may have certain physical symptoms and risks.  Hormone replacement therapy may reduce some of these symptoms and risks. Talk to your health care provider about whether hormone replacement therapy is right for you.  HOME CARE INSTRUCTIONS   Schedule regular health, dental, and eye exams.  Stay current with your immunizations.   Do not use any tobacco products including cigarettes, chewing tobacco, or electronic cigarettes.  If you are pregnant, do not drink alcohol.  If you are breastfeeding, limit how much and how often you drink alcohol.  Limit alcohol intake to no more than 1 drink per day for nonpregnant women. One drink equals 12 ounces of beer, 5 ounces of wine, or 1 ounces of hard liquor.  Do not use street drugs.  Do not share needles.  Ask your health care provider for help if you need support or information about quitting drugs.  Tell your health care provider if you often feel depressed.  Tell your health care provider if you have ever been abused or do not feel safe at home.   This information is not intended to replace advice given to you by your health care provider. Make sure you discuss any questions you have with your health care provider.   Document Released: 05/10/2011 Document Revised: 11/15/2014 Document Reviewed: 09/26/2013 Elsevier Interactive Patient Education [employer].

## 2015-10-07 LAB — HEPATITIS C ANTIBODY: HCV AB: NEGATIVE

## 2015-10-08 LAB — VITAMIN D 1,25 DIHYDROXY
VITAMIN D3 1, 25 (OH): 71 pg/mL
Vitamin D 1, 25 (OH)2 Total: 71 pg/mL (ref 18–72)
Vitamin D2 1, 25 (OH)2: <8 pg/mL

## 2015-10-30 ENCOUNTER — Telehealth: Payer: Self-pay

## 2015-10-30 NOTE — Telephone Encounter (Signed)
Received a fax for medical records. This has been forwarded to health port. [employer]

## 2015-11-04 ENCOUNTER — Encounter: Payer: Self-pay | Admitting: *Deleted

## 2015-11-04 NOTE — Progress Notes (Signed)
Patient ID: Deanna Floyd, female   DOB: 02/08/1961, 54 y.o.   MRN: [id number]  Processed records request. Will send for batch scanning.

## 2015-12-05 MED FILL — VALSARTAN-HCTZ 320-12.5 MG: 320-12.5 | 30 days supply | Qty: 30 | Fill #2

## 2016-01-02 MED FILL — DICLOFENAC SOD DR 75 MG TAB: 75 | 30 days supply | Qty: 60 | Fill #1

## 2016-01-02 MED FILL — VALSARTAN-HCTZ 320-12.5 MG: 320-12.5 | 30 days supply | Qty: 30 | Fill #3

## 2016-01-05 ENCOUNTER — Ambulatory Visit: Payer: No Typology Code available for payment source | Admitting: Family Medicine

## 2016-01-08 ENCOUNTER — Encounter: Payer: Self-pay | Admitting: Family Medicine

## 2016-01-08 ENCOUNTER — Ambulatory Visit (INDEPENDENT_AMBULATORY_CARE_PROVIDER_SITE_OTHER): Payer: No Typology Code available for payment source | Admitting: Family Medicine

## 2016-01-08 VITALS — BP 148/103 | HR 75 | Temp 97.8°F | Ht 62.0 in | Wt 216.0 lb

## 2016-01-08 DIAGNOSIS — I10 Essential (primary) hypertension: Secondary | ICD-10-CM

## 2016-01-08 DIAGNOSIS — Z1211 Encounter for screening for malignant neoplasm of colon: Secondary | ICD-10-CM

## 2016-01-08 DIAGNOSIS — E669 Obesity, unspecified: Secondary | ICD-10-CM | POA: Insufficient documentation

## 2016-01-08 DIAGNOSIS — Z1239 Encounter for other screening for malignant neoplasm of breast: Secondary | ICD-10-CM

## 2016-01-08 MED ORDER — PANTOPRAZOLE SODIUM 40 MG PO TBEC: 40.0000 mg | DELAYED_RELEASE_TABLET | Freq: Every day | ORAL | Status: DC

## 2016-01-08 MED ORDER — AMLODIPINE BESYLATE 5 MG PO TABS: 5.0000 mg | ORAL_TABLET | Freq: Every day | ORAL | Status: DC

## 2016-01-08 MED FILL — ?PANTOPRAZOLE SOD DR 40MG: 40 MG | 30 days supply | Qty: 30 | Fill #0

## 2016-01-08 MED FILL — ?AMLODIPINE BESYLATE 5 MG T: 5 | 30 days supply | Qty: 30 | Fill #0

## 2016-01-08 NOTE — Patient Instructions (Addendum)

## 2016-01-08 NOTE — Progress Notes (Signed)
Patient ID: Deanna Floyd, female   DOB: [birth year], 55 y.o.   MRN: [id number]   Deanna Floyd, is a 55 y.o. female  [id number]  [id number]  DOB - 1961/06/09  CC:  Chief Complaint  Patient presents with  . Follow-up  . Heartburn       HPI: Deanna Floyd is a 55 y.o. female here for follow-up hypertension. She is current on Losartan-Hctz 320/12/5. Her BP is no in good control. She has had her medication today. Her complaints today are of continued foot pain. She has a appointment with [place] in the next week. He other complaint is of frequent heartburn. She states she experiences some shortness of breath with exertion related to recent weight gain. She is u 8 pounds since last visit 3 months ago.Marland Kitchen  Health Maintenance: Tetanus is UTD, had flu shot in November. Has had negative screenings for HIV and Hep C. She reports a normal PAP within the last 2 years, but does need a mammogram. She also reports a normal colonscopy within the last couple of years.   Allergies  Allergen Reactions  . Hydrocortisone Hives   Past Medical History  Diagnosis Date  . Hypertension    Current Outpatient Prescriptions on File Prior to Visit  Medication Sig Dispense Refill  . diclofenac (VOLTAREN) 75 MG EC tablet Take 1 tablet (75 mg total) by mouth 2 (two) times daily. 60 tablet 1  . Multiple Vitamin (MULTIVITAMIN WITH MINERALS) TABS tablet Take 1 tablet by mouth daily.    . valsartan-hydrochlorothiazide (DIOVAN HCT) 320-12.5 MG tablet Take 1 tablet by mouth daily. 90 tablet 3  . PRESCRIPTION MEDICATION Take 1 tablet by mouth daily. Reported on 01/08/2016     No current facility-administered medications on file prior to visit.   Family History  Problem Relation Age of Onset  . Hypertension Sister    Social History   Social History  . Marital Status: Single    Spouse Name: N/A  . Number of Children: N/A  . Years of Education: N/A   Occupational History  . Not on file.    Social History Main Topics  . Smoking status: Never Smoker   . Smokeless tobacco: Not on file  . Alcohol Use: Yes  . Drug Use: No  . Sexual Activity: No   Other Topics Concern  . Not on file   Social History Narrative    Review of Systems: Constitutional: Negative for fever, chills, appetite change, weight loss,  Fatigue. Positive for weight gain Skin: Negative for rashes or lesions of concern. HENT: Negative for ear pain, ear discharge.nose bleeds Eyes: Negative for pain, discharge, redness, itching and visual disturbance. Neck: Negative for pain, stiffness Respiratory: Negative for cough,    Positive for shortness of breath with exertion Cardiovascular: Negative for chest pain, palpitations. Positive for occassional swelling of feet and lower legs Gastrointestinal: Negative for abdominal pain, nausea, vomiting, diarrhea, constipations Genitourinary: Negative for dysuria, urgency, frequency, hematuria,  Musculoskeletal: Negative for back pain, joint pain, joint  swelling, and gait problem.Negative for weakness. Positive for left foot pain Neurological: Negative for dizziness, tremors, seizures, syncope,   light-headedness, numbness and headaches.  Hematological: Negative for easy bruising or bleeding Psychiatric/Behavioral: Negative for depression, anxiety, decreased concentration, confusion   Objective:   Filed Vitals:   01/08/16 0813  BP: 148/103  Pulse: 75  Temp: 97.8 F (36.6 C)    Physical Exam: Constitutional: Patient appears well-developed and well-nourished. No distress. HENT: Normocephalic, atraumatic, External right and left  ear normal. Oropharynx is clear and moist.  Eyes: Conjunctivae and EOM are normal. PERRLA, no scleral icterus. Neck: Normal ROM. Neck supple. No lymphadenopathy, No thyromegaly. CVS: RRR, S1/S2 +, no murmurs, no gallops, no rubs Pulmonary: Effort and breath sounds normal, no stridor, rhonchi, wheezes, rales.  Abdominal: Soft.  Normoactive BS,, no distension, tenderness, rebound or guarding.  Musculoskeletal: Normal range of motion. No edema and no tenderness.  Neuro: Alert.Normal muscle tone coordination. Non-focal Skin: Skin is warm and dry. No rash noted. Not diaphoretic. No erythema. No pallor. Psychiatric: Normal mood and affect. Behavior, judgment, thought content normal.  Lab Results  Component Value Date   WBC 4.7 10/06/2015   HGB 12.3 10/06/2015   HCT 36.0 10/06/2015   MCV 95.0 10/06/2015   PLT 275 10/06/2015   Lab Results  Component Value Date   CREATININE 0.69 10/06/2015   BUN 20 10/06/2015   NA 138 10/06/2015   K 4.3 10/06/2015   CL 104 10/06/2015   CO2 25 10/06/2015    No results found for: HGBA1C Lipid Panel     Component Value Date/Time   CHOL 198 10/06/2015 1201   TRIG 78 10/06/2015 1201   HDL 96 10/06/2015 1201   CHOLHDL 2.1 10/06/2015 1201   VLDL 16 10/06/2015 1201   LDLCALC 86 10/06/2015 1201       Assessment and plan:   1. Breast cancer screening -Mammogram order entered   2. Accelerated hypertension -RF Losartan-HCTZ 325/12/5 -Add Amlodipine 5 mg #90, one po q day.  3. Obesity - Provided printed information on calorie counting for weight loss.   4. Foot pain - Keep appointment with [place].   Return in about 2 weeks (around 01/22/2016). for BP check.  The patient was given clear instructions to go to ER or return to medical center if symptoms don't improve, worsen or new problems develop. The patient verbalized understanding.    Micheline Chapman FNP  01/08/2016, 9:49 AM

## 2016-01-12 ENCOUNTER — Ambulatory Visit: Payer: Self-pay

## 2016-01-12 ENCOUNTER — Encounter: Payer: Self-pay | Admitting: Sports Medicine

## 2016-01-12 ENCOUNTER — Ambulatory Visit (INDEPENDENT_AMBULATORY_CARE_PROVIDER_SITE_OTHER): Payer: No Typology Code available for payment source | Admitting: Sports Medicine

## 2016-01-12 DIAGNOSIS — M79672 Pain in left foot: Secondary | ICD-10-CM

## 2016-01-12 DIAGNOSIS — R609 Edema, unspecified: Secondary | ICD-10-CM

## 2016-01-12 DIAGNOSIS — Z9889 Other specified postprocedural states: Secondary | ICD-10-CM

## 2016-01-12 MED ORDER — MELOXICAM 15 MG PO TABS: 15.0000 mg | ORAL_TABLET | Freq: Every day | ORAL | Status: DC

## 2016-01-12 MED FILL — MELOXICAM 15 MG TABLET: 15 | 30 days supply | Qty: 30 | Fill #0

## 2016-01-12 NOTE — Progress Notes (Signed)
Patient ID: Deanna Floyd, female   DOB: 1961/09/22, 55 y.o.   MRN: [id number] Subjective: Deanna Floyd is a 55 y.o. female patient who presents to office for evaluation ofLeft foot pain. Patient complains of continued pain since surgery in 03/2014 that was performed by Dr. Laurance Flatten; states pain is sharp shooting with swelling especially with standing and walking. Patient has tried Aleve, elevating, and Epsom salt with no relief in symptoms. Patient denies any other pedal complaints. Admits that she is seeking to have her disability paperwork completed; Patient allowed me to read papers that were mailed to her and they clearly read that paperwork should be filled out by primary physician of whom she had an appointment with on 01-05-16.   Patient Active Problem List   Diagnosis Date Noted  . Obesity 01/08/2016  . Low back pain 10/06/2015  . Pain in joint, ankle and foot 10/06/2015    Current Outpatient Prescriptions on File Prior to Visit  Medication Sig Dispense Refill  . amLODipine (NORVASC) 5 MG tablet Take 1 tablet (5 mg total) by mouth daily. 90 tablet 3  . diclofenac (VOLTAREN) 75 MG EC tablet Take 1 tablet (75 mg total) by mouth 2 (two) times daily. 60 tablet 1  . Multiple Vitamin (MULTIVITAMIN WITH MINERALS) TABS tablet Take 1 tablet by mouth daily.    . pantoprazole (PROTONIX) 40 MG tablet Take 1 tablet (40 mg total) by mouth daily. 90 tablet 3  . PRESCRIPTION MEDICATION Take 1 tablet by mouth daily. Reported on 01/08/2016    . valsartan-hydrochlorothiazide (DIOVAN HCT) 320-12.5 MG tablet Take 1 tablet by mouth daily. 90 tablet 3   No current facility-administered medications on file prior to visit.    Allergies  Allergen Reactions  . Hydrocortisone Hives    Objective:  General: Alert and oriented x3 in no acute distress  Dermatology: No open lesions bilateral lower extremities, Surgical scars to left foot well healed, no webspace macerations, no ecchymosis bilateral, all nails x  10 are well manicured.  Vascular: Dorsalis Pedis and Posterior Tibial pedal pulses 2/4, Capillary Fill Time 3 seconds,(+) pedal hair growth bilateral, mild trace edema to left forefoot, Temperature gradient within normal limits.  Neurology: Gross sensation intact via light touch bilateral, (- )Tinels sign left foot.   Musculoskeletal: Diffuse tenderness with palpation to entire Left foot,Pes planus foot type bilateral, No pain with calf compression bilateral. Ankle and pedal joint range of motion within normal limits with mild guarding on left. Strength within normal limits in all groups bilateral.   Xrays  Left Foot   Impression: Surgical hardware intact, there is diffuse arthritic changes present, calcaneal spur present, Pes planus, no fracture/dislocation, soft tissues within normal limits  Assessment and Plan: Problem List Items Addressed This Visit    None    Visit Diagnoses    Left foot pain    -  Primary    Relevant Medications    meloxicam (MOBIC) 15 MG tablet    Other Relevant Orders    DG Foot 2 Views Left    Swelling        Relevant Medications    meloxicam (MOBIC) 15 MG tablet    Post-operative state        Previous Left foot surgery 2015       -Complete examination performed -Xrays reviewed -Discussed treatement options of continued pain and swelling in left post surgical foot -Rx Meloxicam -Dispensed compression anklet to use daily -Advised patient to go to primary physician to have disability  forms completed; I am unable to complete forms for this as the letter dictates. From podiatric standpoint patient is capable of ambulation without functional limitation. -Patient to return to office in 6 weeks or sooner if condition worsens. May consider PT if fails to improve.   Landis Martins, DPM

## 2016-01-15 ENCOUNTER — Telehealth: Payer: Self-pay

## 2016-01-15 NOTE — Telephone Encounter (Signed)
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 She would need to have form filled out by a certified disability doctor. She verbalized understanding. Patient will have daughter p/u forms tomorrow from front desk. Faxed a copy w/ reason back to disability case specialist.

## 2016-01-23 ENCOUNTER — Ambulatory Visit: Payer: No Typology Code available for payment source | Admitting: *Deleted

## 2016-01-23 DIAGNOSIS — Z0130 Encounter for examination of blood pressure without abnormal findings: Secondary | ICD-10-CM

## 2016-02-05 MED FILL — VALSARTAN-HCTZ 320-12.5 MG: 320-12.5 | 30 days supply | Qty: 30 | Fill #4

## 2016-02-05 MED FILL — ?AMLODIPINE BESYLATE 5 MG T: 5 | 30 days supply | Qty: 30 | Fill #1

## 2016-02-05 MED FILL — ?PANTOPRAZOLE SOD DR 40MG: 40 MG | 30 days supply | Qty: 30 | Fill #1

## 2016-02-23 ENCOUNTER — Ambulatory Visit: Payer: No Typology Code available for payment source | Admitting: Sports Medicine

## 2016-03-08 ENCOUNTER — Encounter: Payer: Self-pay | Admitting: Sports Medicine

## 2016-03-08 ENCOUNTER — Ambulatory Visit (INDEPENDENT_AMBULATORY_CARE_PROVIDER_SITE_OTHER): Payer: No Typology Code available for payment source | Admitting: Sports Medicine

## 2016-03-08 DIAGNOSIS — R609 Edema, unspecified: Secondary | ICD-10-CM

## 2016-03-08 DIAGNOSIS — M79672 Pain in left foot: Secondary | ICD-10-CM

## 2016-03-08 DIAGNOSIS — M792 Neuralgia and neuritis, unspecified: Secondary | ICD-10-CM

## 2016-03-08 DIAGNOSIS — Z9889 Other specified postprocedural states: Secondary | ICD-10-CM

## 2016-03-08 MED ORDER — GABAPENTIN 300 MG PO CAPS: 300.0000 mg | ORAL_CAPSULE | Freq: Every day | ORAL | Status: DC

## 2016-03-08 MED FILL — GABAPENTIN 300 MG CAPSULE: 300 | 30 days supply | Qty: 30 | Fill #0

## 2016-03-08 NOTE — Progress Notes (Signed)
Patient ID: Deanna Floyd, female   DOB: 1961/04/28, 55 y.o.   MRN: [id number]  Subjective: Deanna Floyd is a 55 y.o. female patient who returns to office for evaluation of Left foot pain. Patient complains of continued pain since surgery in 03/2014 that was performed by Dr. Laurance Flatten; states pain is sharp shooting with swelling especially with standing and walking as previous. Patient can not tell if the mobic made a difference. Reports pain is worse after period of use or at night when sleeping with shooting pain. Patient states that the anklet did help but she lost it during her move. Denies any other pedal complaints and reports that she is awaiting disability of which her PCP is taking care of the paperwork.   Patient Active Problem List   Diagnosis Date Noted  . Obesity 01/08/2016  . Low back pain 10/06/2015  . Pain in joint, ankle and foot 10/06/2015    Current Outpatient Prescriptions on File Prior to Visit  Medication Sig Dispense Refill  . amLODipine (NORVASC) 5 MG tablet Take 1 tablet (5 mg total) by mouth daily. 90 tablet 3  . diclofenac (VOLTAREN) 75 MG EC tablet Take 1 tablet (75 mg total) by mouth 2 (two) times daily. 60 tablet 1  . meloxicam (MOBIC) 15 MG tablet Take 1 tablet (15 mg total) by mouth daily. 30 tablet 0  . Multiple Vitamin (MULTIVITAMIN WITH MINERALS) TABS tablet Take 1 tablet by mouth daily.    . pantoprazole (PROTONIX) 40 MG tablet Take 1 tablet (40 mg total) by mouth daily. 90 tablet 3  . PRESCRIPTION MEDICATION Take 1 tablet by mouth daily. Reported on 01/08/2016    . valsartan-hydrochlorothiazide (DIOVAN HCT) 320-12.5 MG tablet Take 1 tablet by mouth daily. 90 tablet 3   No current facility-administered medications on file prior to visit.    Allergies  Allergen Reactions  . Hydrocortisone Hives    Objective:  General: Alert and oriented x3 in no acute distress  Dermatology: No open lesions bilateral lower extremities, Surgical scars to left foot well  healed, no webspace macerations, no ecchymosis bilateral, all nails x 10 are well manicured.  Vascular: Dorsalis Pedis and Posterior Tibial pedal pulses 2/4, Capillary Fill Time 3 seconds,(+) pedal hair growth bilateral, mild trace edema to left forefoot and ankle, Temperature gradient within normal limits.  Neurology: Gross sensation intact via light touch bilateral, (- )Tinels sign left foot. Subjective shooting pain at night  Musculoskeletal: Diffuse tenderness with palpation to entire Left foot, Pes planus foot type bilateral, No pain with calf compression bilateral. Ankle and pedal joint range of motion within normal limits with mild guarding on left. Strength within normal limits in all groups bilateral.    Assessment and Plan: Problem List Items Addressed This Visit    None    Visit Diagnoses    Left foot pain    -  Primary    Post-operative state        2015 Left foot surgery by Dr. Laurance Flatten    Swelling        Neuritis        Relevant Medications    gabapentin (NEURONTIN) 300 MG capsule       -Complete examination performed -Discussed treatement options of continued pain and swelling in left post surgical foot -Rx Gabapent 300mg  qhs  -Dispensed surgi-tube stockinette to use daily -Cont with follow up with primary physician to have disability forms completed; From podiatric standpoint at this time, patient is capable of ambulation without functional  limitation however due to pain and swelling in her left foot patient is unable to work where she is required to stand or walk long periods of time. Recommend PT to determine work capacity and for other possible treatment modalities -Recommend good supportive shoes for foot type daily -Patient to return to office in 8 weeks or sooner if condition worsens.   Landis Martins, DPM

## 2016-03-09 ENCOUNTER — Other Ambulatory Visit: Payer: Self-pay | Admitting: Family Medicine

## 2016-03-09 MED FILL — ?PANTOPRAZOLE SOD DR 40MG: 40 MG | 30 days supply | Qty: 30 | Fill #2

## 2016-03-09 MED FILL — VALSARTAN-HCTZ 320-12.5 MG: 320-12.5 | 30 days supply | Qty: 30 | Fill #5

## 2016-03-09 MED FILL — ?AMLODIPINE BESYLATE 5 MG T: 5 | 30 days supply | Qty: 30 | Fill #2

## 2016-03-22 ENCOUNTER — Other Ambulatory Visit: Payer: Self-pay | Admitting: Family Medicine

## 2016-03-22 DIAGNOSIS — K029 Dental caries, unspecified: Secondary | ICD-10-CM

## 2016-04-06 MED FILL — ?AMLODIPINE BESYLATE 5 MG T: 5 | 30 days supply | Qty: 30 | Fill #3

## 2016-04-06 MED FILL — VALSARTAN-HCTZ 320-12.5 MG: 320-12.5 | 30 days supply | Qty: 30 | Fill #6

## 2016-04-06 MED FILL — ?PANTOPRAZOLE SOD DR 40MG: 40 MG | 30 days supply | Qty: 30 | Fill #3

## 2016-04-09 ENCOUNTER — Ambulatory Visit: Payer: Self-pay | Admitting: Family Medicine

## 2016-04-13 ENCOUNTER — Ambulatory Visit: Payer: Self-pay | Attending: Internal Medicine

## 2016-05-05 MED FILL — VALSARTAN-HCTZ 320-12.5 MG: 320-12.5 | 30 days supply | Qty: 30 | Fill #7

## 2016-05-05 MED FILL — ?PANTOPRAZOLE SOD DR 40MG: 40 MG | 30 days supply | Qty: 30 | Fill #4

## 2016-05-05 MED FILL — AMLODIPINE BESYLATE 5 MG TA: 5 | 30 days supply | Qty: 30 | Fill #4

## 2016-05-17 ENCOUNTER — Ambulatory Visit: Payer: No Typology Code available for payment source | Admitting: Sports Medicine

## 2016-06-01 ENCOUNTER — Encounter: Payer: Self-pay | Admitting: Sports Medicine

## 2016-06-01 ENCOUNTER — Ambulatory Visit (INDEPENDENT_AMBULATORY_CARE_PROVIDER_SITE_OTHER): Payer: No Typology Code available for payment source | Admitting: Sports Medicine

## 2016-06-01 ENCOUNTER — Telehealth: Payer: Self-pay | Admitting: *Deleted

## 2016-06-01 DIAGNOSIS — R609 Edema, unspecified: Secondary | ICD-10-CM

## 2016-06-01 DIAGNOSIS — M792 Neuralgia and neuritis, unspecified: Secondary | ICD-10-CM

## 2016-06-01 DIAGNOSIS — Z9889 Other specified postprocedural states: Secondary | ICD-10-CM

## 2016-06-01 DIAGNOSIS — M79672 Pain in left foot: Secondary | ICD-10-CM

## 2016-06-01 MED ORDER — METHYLPREDNISOLONE 4 MG PO TBPK: ORAL_TABLET | ORAL | 0 refills | Status: DC

## 2016-06-01 NOTE — Telephone Encounter (Addendum)
-----   Message from Landis Martins, [place] sent at 06/01/2016  4:31 PM EDT ----- Regarding: EMG/NCV Pain out of proportion left foot surgery by Dr. Laurance Flatten 2015 R/o RSD Please arrange nerve study Thanks Dr Cannon Kettle. Orders faxed to [facility]. 06/02/2016-[facility] states they do not accept the "[employer].  I left message with [facility] - Asencion Partridge to call with protocol to refer pt to Neurologist and informed that pt's "[place] Card" was dated 09/04/2015 - 03/04/2016. 06/03/2016-Left message Retia Passe - [facility] explaining the need for assistance in pt being referred to Neurologist, [facility] card expired 03/04/2016. Informed pt of the delay in referral to Neurology, and told her the [facility] had expired in 02/2016, pt states she has a new one she will bring to the office next week. Retia Passe - [facility] states pt must come to the center and apply for Confidential Financial Aid letter and the neurology appts would be covered 100%. Informed pt of steps to acquire a Confidential Financial Aid Letter, pt states she will try to do today. I told pt we would need a copy of the letter to fax to [facility]. 06/04/2016-Left message for Asencion Partridge - [facility] to call with information concerning pt's Confidential Financial Aid Letter. Asencion Partridge returned my call states pt has not come in to apply for the card. Left message informing pt I had checked with Asencion Partridge - [facility] if they had pt's Confidential Financial Aid Letter, and Asencion Partridge stated pt had not been in to apply for the letter, and that we needed a copy of the letter to refer her to the neurologist. Pt left message requesting the name of the letter she needed.  I left message with name - Confidential Financial Aid Letter.

## 2016-06-02 DIAGNOSIS — Z9889 Other specified postprocedural states: Secondary | ICD-10-CM | POA: Insufficient documentation

## 2016-06-02 NOTE — Progress Notes (Signed)
Patient ID: Deanna Floyd, female   DOB: 07/04/1961, 55 y.o.   MRN: [id number]  Subjective: Deanna Floyd is a 55 y.o. female patient who returns to office for evaluation of Left foot pain. Patient complains of continued pain since surgery in 03/2014 that was performed by Dr. Laurance Flatten; states pain is sharp shooting with swelling especially with standing and walking as previous. Patient states that she could not tolerate the gabapentin took it about 5 times and experienced episodes of nausea. Reports pain is worse after period of use or at night when sleeping with shooting pain throughout the entire foot. Patient states that the compression stockinette given last visit helped a little bit with the swelling but the swelling is still present. Reports that her orthotics that she got from Dr. Laurance Flatten after surgery in 2015 seem to help and that she has been wearing them since last visit. Denies any other pedal complaints.   Patient Active Problem List   Diagnosis Date Noted  . Post-operative state 06/02/2016  . Obesity 01/08/2016  . Low back pain 10/06/2015  . Pain in joint, ankle and foot 10/06/2015    Current Outpatient Prescriptions on File Prior to Visit  Medication Sig Dispense Refill  . amLODipine (NORVASC) 5 MG tablet Take 1 tablet (5 mg total) by mouth daily. 90 tablet 3  . diclofenac (VOLTAREN) 75 MG EC tablet Take 1 tablet (75 mg total) by mouth 2 (two) times daily. 60 tablet 1  . gabapentin (NEURONTIN) 300 MG capsule Take 1 capsule (300 mg total) by mouth at bedtime. 30 capsule 2  . meloxicam (MOBIC) 15 MG tablet Take 1 tablet (15 mg total) by mouth daily. 30 tablet 0  . Multiple Vitamin (MULTIVITAMIN WITH MINERALS) TABS tablet Take 1 tablet by mouth daily.    . pantoprazole (PROTONIX) 40 MG tablet Take 1 tablet (40 mg total) by mouth daily. 90 tablet 3  . PRESCRIPTION MEDICATION Take 1 tablet by mouth daily. Reported on 01/08/2016    . valsartan-hydrochlorothiazide (DIOVAN HCT) 320-12.5 MG  tablet Take 1 tablet by mouth daily. 90 tablet 3   No current facility-administered medications on file prior to visit.     Allergies  Allergen Reactions  . Hydrocortisone Hives    Objective:  General: Alert and oriented x3 in no acute distress  Dermatology: No open lesions bilateral lower extremities, Surgical scars to left foot well healed, no webspace macerations, no ecchymosis bilateral, all nails x 10 are well manicured.  Vascular: Dorsalis Pedis and Posterior Tibial pedal pulses 2/4, Capillary Fill Time 3 seconds,(+) pedal hair growth bilateral, mild trace edema to left forefoot and ankle, Temperature gradient within normal limits.  Neurology: Gross sensation intact via light touch bilateral, (- )Tinels sign left foot. Subjective shooting pain at night that is severe and worse after a period of use.  Musculoskeletal: Diffuse tenderness with palpation to entire Left foot that appears slightly out of proportion, Pes planus foot type bilateral, No pain with calf compression bilateral. Ankle and pedal joint range of motion within normal limits with mild guarding on left. Strength within normal limits in all groups bilateral.    Assessment and Plan: Problem List Items Addressed This Visit      Other   Post-operative state    Other Visit Diagnoses    Left foot pain    -  Primary   Swelling       Neuritis          -Complete examination performed -Discussed treatement options of  continued pain and swelling in left post surgical foot -Prescribed a Medrol Dosepak to take as instructed. Advised patient to call office if adverse reaction is experience or go to ER immediately.  Patient refused local injection of steroid. -Encouraged patient to continue with surgi-tube stockinette to use daily for edema control -Ordered NCV and EMG to rule out RSD -If negative, we'll consider MRI for further evaluation -Cont with follow up with primary physician for disability forms; From podiatric  standpoint at this time, patient is capable of ambulation without functional limitation however due to pain and swelling in her left foot patient is unable to work where she is required to stand or walk long periods of time. Recommend functional capacity evaluation with PT to determine full work capacity and for other possible treatment modalities -Recommend good supportive shoes for foot type daily -Continue with custom functional foot orthotics daily -Patient to return to office after nerve studies or sooner if condition worsens.   Landis Martins, DPM

## 2016-06-02 NOTE — Telephone Encounter (Signed)
[facility] states they do not accept the orange card as insurance, if pt wanted to be seen by them pt would need to be self pay.

## 2016-06-07 ENCOUNTER — Ambulatory Visit (INDEPENDENT_AMBULATORY_CARE_PROVIDER_SITE_OTHER): Payer: No Typology Code available for payment source | Admitting: Family Medicine

## 2016-06-07 ENCOUNTER — Encounter: Payer: Self-pay | Admitting: Family Medicine

## 2016-06-07 VITALS — BP 136/79 | HR 89 | Temp 97.8°F | Resp 16 | Ht 63.0 in | Wt 217.0 lb

## 2016-06-07 DIAGNOSIS — I10 Essential (primary) hypertension: Secondary | ICD-10-CM

## 2016-06-07 LAB — CBC WITH DIFFERENTIAL/PLATELET
BASOS ABS: 0 {cells}/uL (ref 0–200)
Basophils Relative: 0 %
EOS PCT: 3 %
Eosinophils Absolute: 147 cells/uL (ref 15–500)
HEMATOCRIT: 34.1 % — AB (ref 35.0–45.0)
HEMOGLOBIN: 11.2 g/dL — AB (ref 11.7–15.5)
Lymphocytes Relative: 40 %
Lymphs Abs: 1960 cells/uL (ref 850–3900)
MCH: 31.6 pg (ref 27.0–33.0)
MCHC: 32.8 g/dL (ref 32.0–36.0)
MCV: 96.3 fL (ref 80.0–100.0)
MONO ABS: 441 {cells}/uL (ref 200–950)
MPV: 10.8 fL (ref 7.5–12.5)
Monocytes Relative: 9 %
NEUTROS PCT: 48 %
Neutro Abs: 2352 cells/uL (ref 1500–7800)
Platelets: 301 10*3/uL (ref 140–400)
RBC: 3.54 MIL/uL — ABNORMAL LOW (ref 3.80–5.10)
RDW: 13.2 % (ref 11.0–15.0)
WBC: 4.9 10*3/uL (ref 3.8–10.8)

## 2016-06-07 LAB — LIPID PANEL
Cholesterol: 182 mg/dL (ref 125–200)
HDL: 75 mg/dL (ref >=46)
LDL Cholesterol: 90 mg/dL (ref <130)
Total CHOL/HDL Ratio: 2.4 Ratio (ref <=5.0)
Triglycerides: 85 mg/dL (ref <150)
VLDL: 17 mg/dL (ref <30)

## 2016-06-07 LAB — HIV ANTIBODY (ROUTINE TESTING W REFLEX): HIV: NONREACTIVE

## 2016-06-07 MED ORDER — VALSARTAN-HYDROCHLOROTHIAZIDE 320-12.5 MG PO TABS: 1.0000 | ORAL_TABLET | Freq: Every day | ORAL | 3 refills | Status: DC

## 2016-06-07 MED ORDER — AMLODIPINE BESYLATE 5 MG PO TABS: 5.0000 mg | ORAL_TABLET | Freq: Every day | ORAL | 3 refills | Status: DC

## 2016-06-07 MED ORDER — PANTOPRAZOLE SODIUM 40 MG PO TBEC: 40.0000 mg | DELAYED_RELEASE_TABLET | Freq: Every day | ORAL | 3 refills | Status: DC

## 2016-06-08 LAB — COMPLETE METABOLIC PANEL WITH GFR
ALBUMIN: 4.1 g/dL (ref 3.6–5.1)
ALK PHOS: 67 U/L (ref 33–130)
ALT: 19 U/L (ref 6–29)
AST: 17 U/L (ref 10–35)
BUN: 18 mg/dL (ref 7–25)
CALCIUM: 8.9 mg/dL (ref 8.6–10.4)
CO2: 21 mmol/L (ref 20–31)
Chloride: 107 mmol/L (ref 98–110)
Creat: 0.86 mg/dL (ref 0.50–1.05)
GFR, EST NON AFRICAN AMERICAN: 76 mL/min (ref >=60)
GFR, Est African American: 87 mL/min (ref >=60)
GLUCOSE: 102 mg/dL — AB (ref 65–99)
Potassium: 3.8 mmol/L (ref 3.5–5.3)
Sodium: 139 mmol/L (ref 135–146)
Total Bilirubin: 0.2 mg/dL (ref 0.2–1.2)
Total Protein: 6.6 g/dL (ref 6.1–8.1)

## 2016-06-09 ENCOUNTER — Ambulatory Visit: Payer: No Typology Code available for payment source | Attending: Internal Medicine

## 2016-06-09 ENCOUNTER — Other Ambulatory Visit: Payer: Self-pay | Admitting: Family Medicine

## 2016-06-09 DIAGNOSIS — Z1231 Encounter for screening mammogram for malignant neoplasm of breast: Secondary | ICD-10-CM

## 2016-06-09 MED FILL — ?PANTOPRAZOLE SOD DR 40MG: 40 MG | 30 days supply | Qty: 30 | Fill #5

## 2016-06-09 MED FILL — AMLODIPINE BESYLATE 5 MG TA: 5 | 30 days supply | Qty: 30 | Fill #5

## 2016-06-09 MED FILL — METHYLPREDNISOLONE 4 MG TAB: 4 | 6 days supply | Qty: 21 | Fill #0

## 2016-06-09 MED FILL — VALSARTAN-HCTZ 320-12.5 MG: 320-12.5 | 30 days supply | Qty: 30 | Fill #8

## 2016-06-09 NOTE — Progress Notes (Signed)
Deanna Floyd, is a 55 y.o. female  [id number]  [id number]  DOB - April 23, 1961  CC:  Chief Complaint  Patient presents with  . Hypertension  . Weight Gain       HPI: Shriley Cane is a 55 y.o. female here for follow-up hypertension and complaining of weight gain. She also has a history of low back and foot and ankle pain. She has screws and bolts in her ankle from surgery in 2015.  She is currently taking Valsartan-hctz 320/12.5 and amlodipine 5 mg for hypertension. She is on protonis for GERD and gabapentin for nerve pain per Dr. Cannon Kettle. She is needing refills on her BP and GERD medications.   Health Maintenance:  She thinks she has had a tetanus booster within the last 5 years. She has not been screened for HIV or Hep C. She reports her mammogram is up to date but she needs to schedule for a PAP. She reports having a colonoscopy in 2011.  Allergies  Allergen Reactions  . Hydrocortisone Hives   Past Medical History:  Diagnosis Date  . Hypertension    Current Outpatient Prescriptions on File Prior to Visit  Medication Sig Dispense Refill  . diclofenac (VOLTAREN) 75 MG EC tablet Take 1 tablet (75 mg total) by mouth 2 (two) times daily. 60 tablet 1  . gabapentin (NEURONTIN) 300 MG capsule Take 1 capsule (300 mg total) by mouth at bedtime. 30 capsule 2  . meloxicam (MOBIC) 15 MG tablet Take 1 tablet (15 mg total) by mouth daily. 30 tablet 0  . methylPREDNISolone (MEDROL DOSEPAK) 4 MG TBPK tablet Take as instructed 21 tablet 0  . Multiple Vitamin (MULTIVITAMIN WITH MINERALS) TABS tablet Take 1 tablet by mouth daily.    Marland Kitchen PRESCRIPTION MEDICATION Take 1 tablet by mouth daily. Reported on 01/08/2016     No current facility-administered medications on file prior to visit.    Family History  Problem Relation Age of Onset  . Hypertension Sister    Social History   Social History  . Marital status: Single    Spouse name: N/A  . Number of children: N/A  . Years of  education: N/A   Occupational History  . Not on file.   Social History Main Topics  . Smoking status: Former Research scientist (life sciences)  . Smokeless tobacco: Never Used  . Alcohol use Yes     Comment: occ  . Drug use: No  . Sexual activity: No   Other Topics Concern  . Not on file   Social History Narrative  . No narrative on file    Review of Systems: Constitutional: Negative for fever, chills, appetite change, weight loss. Positive for fatigue Skin: Negative for rashes or lesions of concern. HENT: Negative for ear pain, ear discharge.nose bleeds Eyes: Negative for pain, discharge, redness, itching and visual disturbance. Neck: Negative for pain, stiffness Respiratory: Negative for cough, shortness of breath,   Cardiovascular: Negative for chest pain, palpitations and leg swelling. Gastrointestinal: Negative for abdominal pain, nausea, vomiting, diarrhea, constipations Genitourinary: Negative for dysuria, urgency, frequency, hematuria,  Musculoskeletal:Positive for back, lower leg, foot and wrist pain Neurological: Negative for dizziness, tremors, seizures, syncope,   light-headedness, numbness and headaches.  Hematological: Negative for easy bruising or bleeding Psychiatric/Behavioral: Negative for depression, anxiety, decreased concentration, confusion   Objective:   Vitals:   06/07/16 1530  BP: 136/79  Pulse: 89  Resp: 16  Temp: 97.8 F (36.6 C)    Physical Exam: Constitutional: Patient appears well-developed and well-nourished. No  distress. HENT: Normocephalic, atraumatic, External right and left ear normal. Oropharynx is clear and moist.  Eyes: Conjunctivae and EOM are normal. PERRLA, no scleral icterus. Neck: Normal ROM. Neck supple. No lymphadenopathy, No thyromegaly. CVS: RRR, S1/S2 +, no murmurs, no gallops, no rubs Pulmonary: Effort and breath sounds normal, no stridor, rhonchi, wheezes, rales.  Abdominal: Soft. Normoactive BS,, no distension, tenderness, rebound or  guarding.  Musculoskeletal: Normal range of motion. No edema and no tenderness.  Neuro: Alert.Normal muscle tone coordination. Non-focal Skin: Skin is warm and dry. No rash noted. Not diaphoretic. No erythema. No pallor. Psychiatric: Normal mood and affect. Behavior, judgment, thought content normal.  Lab Results  Component Value Date   WBC 4.9 06/07/2016   HGB 11.2 (L) 06/07/2016   HCT 34.1 (L) 06/07/2016   MCV 96.3 06/07/2016   PLT 301 06/07/2016   Lab Results  Component Value Date   CREATININE 0.86 06/07/2016   BUN 18 06/07/2016   NA 139 06/07/2016   K 3.8 06/07/2016   CL 107 06/07/2016   CO2 21 06/07/2016    No results found for: HGBA1C Lipid Panel     Component Value Date/Time   CHOL 182 06/07/2016 1600   TRIG 85 06/07/2016 1600   HDL 75 06/07/2016 1600   CHOLHDL 2.4 06/07/2016 1600   VLDL 17 06/07/2016 1600   LDLCALC 90 06/07/2016 1600       Assessment and plan:   1. Accelerated hypertension  - CBC with Differential - COMPLETE METABOLIC PANEL WITH GFR - Lipid panel - HIV antibody (with reflex) - MM DIGITAL SCREENING BILATERAL; Future   No Follow-up on file.  The patient was given clear instructions to go to ER or return to medical center if symptoms don't improve, worsen or new problems develop. The patient verbalized understanding.    Micheline Chapman FNP  06/09/2016, 2:40 PM

## 2016-06-15 ENCOUNTER — Ambulatory Visit
Admission: RE | Admit: 2016-06-15 | Discharge: 2016-06-15 | Disposition: A | Discharge status: Home / Self Care | Payer: No Typology Code available for payment source | Source: Ambulatory Visit | Attending: Family Medicine | Admitting: Family Medicine

## 2016-06-15 DIAGNOSIS — Z1231 Encounter for screening mammogram for malignant neoplasm of breast: Secondary | ICD-10-CM

## 2016-07-01 ENCOUNTER — Ambulatory Visit: Payer: No Typology Code available for payment source | Admitting: Family Medicine

## 2016-07-06 MED FILL — AMLODIPINE BESYLATE 5 MG TA: 5 | 30 days supply | Qty: 30 | Fill #6

## 2016-07-06 MED FILL — ?PANTOPRAZOLE SOD DR 40MG: 40 MG | 30 days supply | Qty: 30 | Fill #6

## 2016-07-06 MED FILL — VALSARTAN-HCTZ 320-12.5 MG: 320-12.5 | 30 days supply | Qty: 30 | Fill #9

## 2016-08-04 MED FILL — PANTOPRAZOLE SOD DR 40 MG T: 40 | 30 days supply | Qty: 30 | Fill #7

## 2016-08-04 MED FILL — AMLODIPINE BESYLATE 5 MG TA: 5 | 30 days supply | Qty: 30 | Fill #7

## 2016-08-04 MED FILL — VALSARTAN-HCTZ 320-12.5 MG: 320-12.5 | 30 days supply | Qty: 30 | Fill #10

## 2016-08-06 ENCOUNTER — Ambulatory Visit (INDEPENDENT_AMBULATORY_CARE_PROVIDER_SITE_OTHER): Payer: No Typology Code available for payment source

## 2016-08-06 DIAGNOSIS — Z23 Encounter for immunization: Secondary | ICD-10-CM

## 2016-08-09 ENCOUNTER — Telehealth: Payer: Self-pay | Admitting: *Deleted

## 2016-08-09 DIAGNOSIS — R609 Edema, unspecified: Secondary | ICD-10-CM

## 2016-08-09 DIAGNOSIS — M79672 Pain in left foot: Secondary | ICD-10-CM

## 2016-08-09 DIAGNOSIS — M792 Neuralgia and neuritis, unspecified: Secondary | ICD-10-CM

## 2016-08-09 NOTE — Telephone Encounter (Signed)
Pt brought copy of Confidential Finacial Letter, is scheduled with [facility] for NCV with EMG.

## 2016-08-10 ENCOUNTER — Encounter: Payer: Self-pay | Admitting: Family Medicine

## 2016-08-10 ENCOUNTER — Other Ambulatory Visit (HOSPITAL_COMMUNITY)
Admission: RE | Admit: 2016-08-10 | Discharge: 2016-08-10 | Disposition: A | Discharge status: Home / Self Care | Payer: No Typology Code available for payment source | Source: Ambulatory Visit | Attending: Family Medicine | Admitting: Family Medicine

## 2016-08-10 ENCOUNTER — Ambulatory Visit (INDEPENDENT_AMBULATORY_CARE_PROVIDER_SITE_OTHER): Payer: No Typology Code available for payment source | Admitting: Family Medicine

## 2016-08-10 VITALS — BP 136/85 | HR 98 | Temp 98.0°F | Resp 14 | Ht 63.0 in | Wt 216.0 lb

## 2016-08-10 DIAGNOSIS — Z01419 Encounter for gynecological examination (general) (routine) without abnormal findings: Secondary | ICD-10-CM | POA: Insufficient documentation

## 2016-08-10 DIAGNOSIS — Z1151 Encounter for screening for human papillomavirus (HPV): Secondary | ICD-10-CM | POA: Insufficient documentation

## 2016-08-10 NOTE — Patient Instructions (Signed)
Follow-up in 6 months and sooner if needed.  I would try to follow a 1200 calorie diet (can find online) and increase exercise.

## 2016-08-10 NOTE — Progress Notes (Signed)
Deanna Floyd, is a 55 y.o. female  [id number]  [id number]  DOB - 1961/05/09  CC:  Chief Complaint  Patient presents with  . Gynecologic Exam       HPI: Deanna Floyd is a 55 y.o. female here for PAP smear. She has been seen recently for routine health maintenance and chronic conditions. She reports no GYN issues. She had her last menstural period about 10 years ago. She denies in unusual discharge, vaginal bleeding,pelvic pain. She is not sexually active for some time. She has had a recent mammogram.  She is interested in losing weight. She had a flu shot when she was here last.  Allergies  Allergen Reactions  . Hydrocortisone Hives   Past Medical History:  Diagnosis Date  . Hypertension    Current Outpatient Prescriptions on File Prior to Visit  Medication Sig Dispense Refill  . amLODipine (NORVASC) 5 MG tablet Take 1 tablet (5 mg total) by mouth daily. 90 tablet 3  . diclofenac (VOLTAREN) 75 MG EC tablet Take 1 tablet (75 mg total) by mouth 2 (two) times daily. 60 tablet 1  . gabapentin (NEURONTIN) 300 MG capsule Take 1 capsule (300 mg total) by mouth at bedtime. 30 capsule 2  . meloxicam (MOBIC) 15 MG tablet Take 1 tablet (15 mg total) by mouth daily. 30 tablet 0  . Multiple Vitamin (MULTIVITAMIN WITH MINERALS) TABS tablet Take 1 tablet by mouth daily.    . pantoprazole (PROTONIX) 40 MG tablet Take 1 tablet (40 mg total) by mouth daily. 90 tablet 3  . valsartan-hydrochlorothiazide (DIOVAN HCT) 320-12.5 MG tablet Take 1 tablet by mouth daily. 90 tablet 3  . methylPREDNISolone (MEDROL DOSEPAK) 4 MG TBPK tablet Take as instructed (Patient not taking: Reported on 08/10/2016) 21 tablet 0  . PRESCRIPTION MEDICATION Take 1 tablet by mouth daily. Reported on 01/08/2016     No current facility-administered medications on file prior to visit.    Family History  Problem Relation Age of Onset  . Hypertension Sister    Social History   Social History  . Marital status:  Single    Spouse name: N/A  . Number of children: N/A  . Years of education: N/A   Occupational History  . Not on file.   Social History Main Topics  . Smoking status: Former Research scientist (life sciences)  . Smokeless tobacco: Never Used  . Alcohol use Yes     Comment: occ  . Drug use: No  . Sexual activity: No   Other Topics Concern  . Not on file   Social History Narrative  . No narrative on file    Review of Systems: See HPI  Objective:   Vitals:   08/10/16 1508  BP: 136/85  Pulse: 98  Resp: 14  Temp: 98 F (36.7 C)    Physical Exam: Constitutional: Patient appears well-developed and well-nourished. No distress. Abdominal: Soft. Normoactive BS,, no distension, tenderness, rebound or guarding.  Neuro: AlertSkin: Skin is warm and dry. No rash noted. Not diaphoretic. No erythema. No pallor. Psychiatric: Normal mood and affect.  GYN: Cervix midline, mildly inflammed. No unusual discharge. No CTM, No adnexal tenderness.  Lab Results  Component Value Date   WBC 4.9 06/07/2016   HGB 11.2 (L) 06/07/2016   HCT 34.1 (L) 06/07/2016   MCV 96.3 06/07/2016   PLT 301 06/07/2016   Lab Results  Component Value Date   CREATININE 0.86 06/07/2016   BUN 18 06/07/2016   NA 139 06/07/2016   K 3.8 06/07/2016  CL 107 06/07/2016   CO2 21 06/07/2016    No results found for: HGBA1C Lipid Panel     Component Value Date/Time   CHOL 182 06/07/2016 1600   TRIG 85 06/07/2016 1600   HDL 75 06/07/2016 1600   CHOLHDL 2.4 06/07/2016 1600   VLDL 17 06/07/2016 1600   LDLCALC 90 06/07/2016 1600       Assessment and plan:   1. Well woman exam  - Cytology - PAP [place]   Return in about 6 months (around 02/08/2017).  The patient was given clear instructions to go to ER or return to medical center if symptoms don't improve, worsen or new problems develop. The patient verbalized understanding.    Micheline Chapman FNP  08/10/2016, 3:47 PM

## 2016-08-11 LAB — CYTOLOGY - PAP

## 2016-08-26 ENCOUNTER — Ambulatory Visit (INDEPENDENT_AMBULATORY_CARE_PROVIDER_SITE_OTHER): Payer: No Typology Code available for payment source | Admitting: Neurology

## 2016-08-26 DIAGNOSIS — R609 Edema, unspecified: Secondary | ICD-10-CM

## 2016-08-26 DIAGNOSIS — M79672 Pain in left foot: Secondary | ICD-10-CM

## 2016-08-26 DIAGNOSIS — M792 Neuralgia and neuritis, unspecified: Secondary | ICD-10-CM

## 2016-08-26 NOTE — Procedures (Signed)
[facility]  [address]  [place], [place] [address] Tel: [phone] Fax:  [phone] Test Date:  08/26/2016  Patient: Deanna Floyd DOB: 02-07-1961 Physician: Narda Amber, DO  Sex: Female Height: 5\' 2"  Ref Phys: Lady Saucier Stover,M.D.  ID#: [id number] Temp: 32.6C Technician: M. Dean   Patient Complaints: This is a 55 year old female With history of left ankle surgery in 2015 referred for evaluation of left foot pain, numbness, and weakness.  NCV & EMG Findings: Extensive electrodiagnostic testing of the left lower extremity shows: 1. Left sural and superficial peroneal sensory responses are within normal limits. 2. Left peroneal and tibial motor responses are within normal limits. 3. There is no evidence of active or chronic motor axon loss changes affecting any of the tested muscles. There is a global pattern of incomplete motor unit recruitment which may be due to pain, effort, and less likely central disorder of motor unit control.  Impression: This is a normal study of the left lower extremity. In particular, there is no evidence of a generalized sensorimotor polyneuropathy or lumbosacral radiculopathy.   ___________________________ Narda Amber, DO    Nerve Conduction Studies Anti Sensory Summary Table   Stim Site NR Peak (ms) Norm Peak (ms) P-T Amp (V) Norm P-T Amp  Left Sup Peroneal Anti Sensory (Ant Lat Mall)  32.6C  12 cm    1.9 <4.6 10.6 >4  Left Sural Anti Sensory (Lat Mall)  Calf    2.6 <4.6 9.0 >4   Motor Summary Table   Stim Site NR Onset (ms) Norm Onset (ms) O-P Amp (mV) Norm O-P Amp Site1 Site2 Delta-0 (ms) Dist (cm) Vel (m/s) Norm Vel (m/s)  Left Peroneal Motor (Ext Dig Brev)  Ankle    3.0 <6.0 3.0 >2.5 B Fib Ankle 6.7 31.0 46 >40  B Fib    9.7  2.7  Poplt B Fib 1.8 10.0 56 >40  Poplt    11.5  2.6         Left Tibial Motor (Abd Hall Brev)  32.6C  Ankle    3.8 <6.0 5.4 >4 Knee Ankle 7.5 37.0 49 >40  Knee    11.3  3.6           EMG   Side Muscle Ins Act Fibs Psw Fasc Number Recrt Dur Dur. Amp Amp. Poly Poly. Comment  Left AntTibialis Nml Nml Nml Nml 1- Mod-V Nml Nml Nml Nml Nml Nml N/A  Left Gastroc Nml Nml Nml Nml 2- Mod-V Nml Nml Nml Nml Nml Nml N/A  Left Flex Dig Long Nml Nml Nml Nml 3- Mod-V Nml Nml Nml Nml Nml Nml N/A  Left RectFemoris Nml Nml Nml Nml 1- Mod-V Nml Nml Nml Nml Nml Nml N/A  Left GluteusMed Nml Nml Nml Nml 1- Mod-V Nml Nml Nml Nml Nml Nml N/A  Left BicepsFemS Nml Nml Nml Nml 1- Mod-V Nml Nml Nml Nml Nml Nml N/A      Waveforms:

## 2016-08-31 ENCOUNTER — Ambulatory Visit (INDEPENDENT_AMBULATORY_CARE_PROVIDER_SITE_OTHER): Payer: No Typology Code available for payment source | Admitting: Sports Medicine

## 2016-08-31 DIAGNOSIS — M792 Neuralgia and neuritis, unspecified: Secondary | ICD-10-CM

## 2016-08-31 DIAGNOSIS — R609 Edema, unspecified: Secondary | ICD-10-CM

## 2016-08-31 DIAGNOSIS — Z9889 Other specified postprocedural states: Secondary | ICD-10-CM

## 2016-08-31 DIAGNOSIS — M79672 Pain in left foot: Secondary | ICD-10-CM

## 2016-08-31 DIAGNOSIS — L600 Ingrowing nail: Secondary | ICD-10-CM

## 2016-08-31 MED ORDER — GABAPENTIN 300 MG PO CAPS: 300.0000 mg | ORAL_CAPSULE | Freq: Every day | ORAL | 2 refills | Status: DC

## 2016-08-31 MED FILL — VALSARTAN-HCTZ 320-12.5 MG: 320-12.5 | 30 days supply | Qty: 30 | Fill #11

## 2016-08-31 MED FILL — ?AMLODIPINE BESYLATE 5 MG T: 5 | 30 days supply | Qty: 30 | Fill #8

## 2016-08-31 MED FILL — GABAPENTIN 300 MG CAPSULE: 300 | 30 days supply | Qty: 30 | Fill #0

## 2016-08-31 MED FILL — PANTOPRAZOLE SOD DR 40 MG T: 40 | 30 days supply | Qty: 30 | Fill #8

## 2016-08-31 NOTE — Progress Notes (Signed)
Patient ID: Deanna Floyd, female   DOB: 11/22/1960, 55 y.o.   MRN: [id number]  Subjective: Deanna Floyd is a 55 y.o. female patient who returns to office for evaluation of Left foot pain. Patient went for nerve studies and is here for the results. Patient states continued pain and swelling in the left foot. Patient admits to pain at 2nd toenail on left that she desires me to look at. Patient is also overly concerned about her disability paperwork. Denies any other pedal complaints.   Patient Active Problem List   Diagnosis Date Noted  . Post-operative state 06/02/2016  . Obesity 01/08/2016  . Low back pain 10/06/2015  . Pain in joint, ankle and foot 10/06/2015    Current Outpatient Prescriptions on File Prior to Visit  Medication Sig Dispense Refill  . amLODipine (NORVASC) 5 MG tablet Take 1 tablet (5 mg total) by mouth daily. 90 tablet 3  . diclofenac (VOLTAREN) 75 MG EC tablet Take 1 tablet (75 mg total) by mouth 2 (two) times daily. 60 tablet 1  . meloxicam (MOBIC) 15 MG tablet Take 1 tablet (15 mg total) by mouth daily. 30 tablet 0  . methylPREDNISolone (MEDROL DOSEPAK) 4 MG TBPK tablet Take as instructed (Patient not taking: Reported on 08/10/2016) 21 tablet 0  . Multiple Vitamin (MULTIVITAMIN WITH MINERALS) TABS tablet Take 1 tablet by mouth daily.    . pantoprazole (PROTONIX) 40 MG tablet Take 1 tablet (40 mg total) by mouth daily. 90 tablet 3  . PRESCRIPTION MEDICATION Take 1 tablet by mouth daily. Reported on 01/08/2016    . valsartan-hydrochlorothiazide (DIOVAN HCT) 320-12.5 MG tablet Take 1 tablet by mouth daily. 90 tablet 3   No current facility-administered medications on file prior to visit.     Allergies  Allergen Reactions  . Hydrocortisone Hives    Objective:  General: Alert and oriented x3 in no acute distress  Dermatology: No open lesions bilateral lower extremities, Surgical scars to left foot well healed, no webspace macerations, no ecchymosis bilateral, all  nails x 10 are well manicured with mild early ingrowing at medial left 2nd toe with no acute infection.  Vascular: Dorsalis Pedis and Posterior Tibial pedal pulses 2/4, Capillary Fill Time 3 seconds,(+) pedal hair growth bilateral, mild trace edema to left forefoot and ankle, Temperature gradient within normal limits.  Neurology: Gross sensation intact via light touch bilateral, (- )Tinels sign left foot. Subjective shooting pain at night that is severe and worse after a period of use, radiating to ankle.  Musculoskeletal: Diffuse tenderness with palpation to entire Left foot that appears slightly out of proportion, Pes planus foot type bilateral, No pain with calf compression bilateral. Ankle and pedal joint range of motion within normal limits with mild guarding on left. Strength within normal limits in all groups bilateral.    Assessment and Plan: Problem List Items Addressed This Visit      Other   Post-operative state    Other Visit Diagnoses    Ingrowing nail    -  Primary   L 2nd toe no infection   Neuritis       Relevant Medications   gabapentin (NEURONTIN) 300 MG capsule   Foot pain, left       Swelling       Left foot pain          -Complete examination performed -Discussed treatement options of continued pain and swelling in left post surgical foot -Encouraged patient to continue with surgi-tube stockinette to use daily  for edema control -NCV reviewed, within normal limits. Due to patient's symptoms could have small fiber involvement -Rx Gabapentin 300mg  QHS -If remains painful we'll consider MRI for further evaluation -Cont with follow up with primary care physician (PCP) for disability forms; From podiatric standpoint at this time, patient is capable of ambulation without functional limitation however due to pain and swelling in her left foot patient is unable to work where she is required to stand or walk long periods of time. Recommend functional capacity evaluation  with PT to determine full work capacity and advised patient that this ordered by her PCP -Recommend good supportive shoes for foot type daily -Continue with custom functional foot orthotics daily -Patient to return to office as needed or sooner if condition worsens.   Landis Martins, DPM

## 2016-09-07 NOTE — Telephone Encounter (Addendum)
-----   Message from Marlou Sa sent at 09/06/2016 12:41 PM EDT ----- Regarding: medication Patient came in during lunch and stated that she is unable to take the Gabapentin rx that Dr. Cannon Kettle had prescribed her due to unpleasant side effects. Patient is requesting a different prescription if possible. Please advise. 09/07/2016-Left message to call with the reaction she had to the Gabapentin. 09/08/2016-I spoke with pt she states the Gabapentin made her "spit up", "it didn't agree with her". I informed pt of the change of medication and that it would be called to [facility]. I told pt I would order 30 days of Lyrica, and just before completion of the medications she should call me, with her progress. Pt states understanding.

## 2016-09-07 NOTE — Telephone Encounter (Signed)
See what her side effects are? If she can not tolerate the Gabapentin then we can try lyrica 75mg  bid Thanks Dr. Cannon Kettle

## 2016-09-07 NOTE — Telephone Encounter (Deleted)
-----   Message from Marlou Sa sent at 09/06/2016 12:41 PM EDT ----- Regarding: medication Patient came in during lunch and stated that she is unable to take the Gabapentin rx that Dr. Cannon Kettle had prescribed her due to unpleasant side effects. Patient is requesting a different prescription if possible. Please advise

## 2016-09-08 MED ORDER — PREGABALIN 75 MG PO CAPS: 75.0000 mg | ORAL_CAPSULE | Freq: Two times a day (BID) | ORAL | 0 refills | Status: DC

## 2016-09-10 ENCOUNTER — Ambulatory Visit: Payer: Self-pay

## 2016-09-22 ENCOUNTER — Other Ambulatory Visit: Payer: Self-pay | Admitting: *Deleted

## 2016-09-22 DIAGNOSIS — Z0130 Encounter for examination of blood pressure without abnormal findings: Secondary | ICD-10-CM | POA: Insufficient documentation

## 2016-09-22 MED ORDER — PREGABALIN 75 MG PO CAPS: 75.0000 mg | ORAL_CAPSULE | Freq: Two times a day (BID) | ORAL | 3 refills | Status: DC

## 2016-09-22 NOTE — Telephone Encounter (Signed)
PRINTED FOR THE PASS PROGRAM 

## 2016-09-24 ENCOUNTER — Ambulatory Visit: Payer: Self-pay | Attending: Internal Medicine

## 2016-10-04 MED FILL — ?PANTOPRAZOLE SOD DR 40MG: 40 MG | 30 days supply | Qty: 30 | Fill #0

## 2016-10-04 MED FILL — ?AMLODIPINE BESYLATE 5 MG T: 5 | 30 days supply | Qty: 30 | Fill #9

## 2016-10-04 MED FILL — VALSARTAN-HCTZ 320-12.5 MG: 320-12.5 | 30 days supply | Qty: 30 | Fill #0

## 2016-11-04 MED FILL — ?PANTOPRAZOLE SOD DR 40MG: 40 MG | 30 days supply | Qty: 30 | Fill #1

## 2016-11-04 MED FILL — ?AMLODIPINE BESYLATE 5 MG T: 5 | 30 days supply | Qty: 30 | Fill #10

## 2016-11-04 MED FILL — VALSARTAN-HCTZ 320-12.5 MG: 320-12.5 | 30 days supply | Qty: 30 | Fill #1

## 2016-11-16 ENCOUNTER — Telehealth: Payer: Self-pay | Admitting: *Deleted

## 2016-11-16 NOTE — Telephone Encounter (Signed)
Keep me update. I'm not sure what medication she is referring to. Also as far as disability I told patient since I was not the doctor that did her surgery I can not complete disability, her PCP was suppose to be taking care of the paperwork. I did patient if her PCP needed my notes that we can send them to her so that way the PCP would know from my point of view what is going on with her foot. Dr.Demetrice Combes

## 2016-11-16 NOTE — Telephone Encounter (Addendum)
Pt states she is having a reaction to a pain medication Dr. Cannon Kettle gave her, a rash to her face. Pt asked if her disability papers have been faxed. I spoke with pt, she states received the medication in the mail and took it, and in the night 2-3 days ago she woke up itching with a rash on her face and under her breast. Pt states she has cleaned her face with clean water and alcohol, and is picking scabs off her face. I told pt to go to her PCP for the rash and asked her what the medication was and she said it started with "L". I told pt to make an appt with her PCP, and later today call me with the name of the medication. Pt called after hours and spelled the medication she received in the mail, "[id number]". I reviewed pt's medication orders and Dr. Doreene Burke ordered the medication not Dr. Cannon Kettle. I informed pt of the doctor who wrote the Lyrica, and that Dr. Cannon Kettle states she did not perform the pt's surgery so can not complete the disability form. Pt states she will have to pick up the letter for disability from our office where she left it and take it to her PCP.

## 2016-11-19 ENCOUNTER — Ambulatory Visit (INDEPENDENT_AMBULATORY_CARE_PROVIDER_SITE_OTHER): Payer: No Typology Code available for payment source | Admitting: Family Medicine

## 2016-11-19 ENCOUNTER — Encounter: Payer: Self-pay | Admitting: Family Medicine

## 2016-11-19 VITALS — BP 125/79 | HR 93 | Temp 97.8°F | Resp 16 | Ht 63.0 in | Wt 217.0 lb

## 2016-11-19 DIAGNOSIS — Z889 Allergy status to unspecified drugs, medicaments and biological substances status: Secondary | ICD-10-CM | POA: Insufficient documentation

## 2016-11-19 DIAGNOSIS — E8881 Metabolic syndrome: Secondary | ICD-10-CM

## 2016-11-19 DIAGNOSIS — L239 Allergic contact dermatitis, unspecified cause: Secondary | ICD-10-CM | POA: Insufficient documentation

## 2016-11-19 DIAGNOSIS — I10 Essential (primary) hypertension: Secondary | ICD-10-CM | POA: Insufficient documentation

## 2016-11-19 DIAGNOSIS — G5603 Carpal tunnel syndrome, bilateral upper limbs: Secondary | ICD-10-CM

## 2016-11-19 LAB — POCT URINALYSIS DIP (DEVICE)
BILIRUBIN URINE: NEGATIVE
GLUCOSE, UA: NEGATIVE mg/dL
HGB URINE DIPSTICK: NEGATIVE
KETONES UR: NEGATIVE mg/dL
LEUKOCYTES UA: NEGATIVE
Nitrite: NEGATIVE
Protein, ur: NEGATIVE mg/dL
SPECIFIC GRAVITY, URINE: 1.02 (ref 1.005–1.030)
Urobilinogen, UA: 0.2 mg/dL (ref 0.0–1.0)
pH: 5 (ref 5.0–8.0)

## 2016-11-19 LAB — COMPLETE METABOLIC PANEL WITH GFR
ALBUMIN: 4.4 g/dL (ref 3.6–5.1)
ALK PHOS: 65 U/L (ref 33–130)
ALT: 24 U/L (ref 6–29)
AST: 17 U/L (ref 10–35)
BUN: 17 mg/dL (ref 7–25)
CO2: 27 mmol/L (ref 20–31)
Calcium: 9.7 mg/dL (ref 8.6–10.4)
Chloride: 105 mmol/L (ref 98–110)
Creat: 0.82 mg/dL (ref 0.50–1.05)
GFR, Est African American: >89 mL/min (ref >=60)
GFR, Est Non African American: 80 mL/min (ref >=60)
Glucose, Bld: 89 mg/dL (ref 65–99)
POTASSIUM: 4.1 mmol/L (ref 3.5–5.3)
SODIUM: 140 mmol/L (ref 135–146)
TOTAL PROTEIN: 7.6 g/dL (ref 6.1–8.1)
Total Bilirubin: 0.3 mg/dL (ref 0.2–1.2)

## 2016-11-19 LAB — HEMOGLOBIN A1C
Hgb A1c MFr Bld: 5.9 % — ABNORMAL HIGH (ref <5.7)
MEAN PLASMA GLUCOSE: 123 mg/dL

## 2016-11-19 MED ORDER — TRIAMCINOLONE ACETONIDE 0.1 % EX CREA: 1.0000 "application " | TOPICAL_CREAM | Freq: Two times a day (BID) | CUTANEOUS | 0 refills | Status: DC

## 2016-11-19 MED ORDER — CETIRIZINE HCL 10 MG PO TABS: 10.0000 mg | ORAL_TABLET | Freq: Every day | ORAL | 2 refills | Status: DC

## 2016-11-19 MED FILL — ?CETIRIZINE HCL 10 MG TABLE: 10 | 30 days supply | Qty: 30 | Fill #0

## 2016-11-19 MED FILL — TRIAMCINOLONE 0.1% CREAM: 0.1 | 14 days supply | Qty: 30 | Fill #0

## 2016-11-19 NOTE — Progress Notes (Signed)
Subjective:    Patient ID: Deanna Floyd, female    DOB: 1961-05-03, 56 y.o.   MRN: [id number]  HPI  [name] is here for evaluation of possible allergic reaction to Lyrica. Patient states that she took 2 doses of Lyrica. Patient complains of a facial rash and itching after taking Lyrica. She says that rash started to dissipate after using topical triamcinolone cream. She says that she did not change soaps, laundry detergent, or  Lotions recently. She endorses pruritis to face and neck. She denies fatigue, dizziness, or facial swelling.  [name] is complaining of numbness, tingling in weakness ot hands bilaterally. 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 Onset of the symptoms was several months ago. Current symptoms include tingling involving the proximal aspect of the bilateral hand, of moderate severity and pain involving the distal aspect of the bilateral hand, of moderate severity. Pain intensity is 5/10 and occurs primarily at night.  Patient's course of [id number] worsening.   Patient here for follow-up of elevated blood pressure. She is not exercising and is not adherent to low salt diet. She does not check blood pressures at home.  Cardiovascular risk factors: obesity (BMI >= 30 kg/m2) and sedentary lifestyle.    Review of Systems  Constitutional: Positive for fatigue.  HENT: Negative for congestion and dental problem.   Eyes: Positive for itching.  Respiratory: Negative.   Gastrointestinal: Negative.   Endocrine: Negative.   Genitourinary: Negative.   Musculoskeletal: Negative.   Skin: Positive for rash.  Allergic/Immunologic: Negative.   Neurological: Positive for weakness (upper extremities bilaterally) and numbness.  Psychiatric/Behavioral: Negative.        Objective:   Physical Exam  Constitutional: She appears well-developed and well-nourished.  HENT:  Head: Normocephalic and atraumatic.  Right Ear: External ear normal.  Left Ear:  External ear normal.  Nose: Nose normal.  Mouth/Throat: Oropharynx is clear and moist.  Eyes: Conjunctivae and EOM are normal. Pupils are equal, round, and reactive to light.  Neck: Normal range of motion. Neck supple.  Cardiovascular: Normal rate, regular rhythm, normal heart sounds and intact distal pulses.   Pulmonary/Chest: Effort normal and breath sounds normal.  Abdominal: Soft. Bowel sounds are normal.  Neurological: No cranial nerve deficit or sensory deficit. She exhibits abnormal muscle tone.  Positive Phalen sigh Positive Tinel         BP 125/79 (BP Location: Right Arm, Patient Position: Sitting, Cuff Size: Large)   Pulse 93   Temp 97.8 F (36.6 C) (Oral)   Resp 16   Ht 5\' 3"  (1.6 m)   Wt 217 lb (98.4 kg)   SpO2 99%   BMI 38.44 kg/m  Assessment & Plan:  1. Allergy status to unspecified drugs, medicaments and biological substances status - cetirizine (ZYRTEC) 10 MG tablet; Take 1 tablet (10 mg total) by mouth daily.  Dispense: 30 tablet; Refill: 2 - triamcinolone cream (KENALOG) 0.1 %; Apply 1 application topically 2 (two) times daily.  Dispense: 30 g; Refill: 0  2. Allergic dermatitis - cetirizine (ZYRTEC) 10 MG tablet; Take 1 tablet (10 mg total) by mouth daily.  Dispense: 30 tablet; Refill: 2 - triamcinolone cream (KENALOG) 0.1 %; Apply 1 application topically 2 (two) times daily.  Dispense: 30 g; Refill: 0  3. Essential hypertension Blood pressure is at goal on current medication regimen. Will continue medication  at current dosage - COMPLETE METABOLIC PANEL WITH GFR  4. Metabolic syndrome Recommend a lowfat, low carbohydrate diet divided  over 5-6 small meals, increase water intake to 6-8 glasses, and 150 minutes per week of cardiovascular exercise.   - HgB A1c  5. Carpal tunnel syndrome on both sides Recommend carpal tunnel splints bilaterally Ice therapy as discussed Patient may warrant physical therapy If symptoms worsen   RTC: 3 months for  hypertension and carpal tunnell Dorena Dew, FNP

## 2016-11-19 NOTE — Patient Instructions (Addendum)
Recommend Carpal Tunnel Splint, to both arms to compress median nerve.  Start Ceterizine 10 mg daily for allergic dermatitis Apply Triamcinolone to affected areas twice daily as needed.  Use Dove Unscented body wash and hypoallergenic lotion.   Drug Allergy Introduction A drug allergy is when your body reacts in a bad way to a medicine. This can be life-threatening. If you have an allergic reaction, get help right away, even if the reaction seems gentle (mild). Your doctor may teach you how to use an allergy kit (anaphylaxis kit) and how to give yourself an allergy shot (epinephrine injection). You can give yourself an allergy shot with what is commonly called an auto-injector "pen." Symptoms of a Gentle Reaction  A stuffy nose (nasal congestion).  Tingling in your mouth.  An itchy, red rash. Symptoms of a Very Bad Reaction  Swelling of your eyes, lips, face, or tongue.  Swelling of the back of your mouth and your throat.  Breathing loudly (wheezing).  A hoarse voice.  Itchy, red, swollen areas of skin (hives).  Dizziness or light-headedness.  Passing out (fainting).  Feeling worried or nervous (anxiety).  Feeling confused.  Pain in your belly (abdomen).  Trouble with breathing, talking, or swallowing.  A tight feeling in your chest.  Fast or uneven heartbeats (palpitations).  Throwing up (vomiting).  Watery poop (diarrhea). Follow these instructions at home: If You Have a Very Bad Allergy:  Always keep an auto-injector pen or your allergy kit with you. These could save your life. Use them as told by your doctor.  Make sure that you, the people who live with you, and your employer know:  How to use your allergy kit.  How to use an auto-injector pen to give you an allergy shot.  If you used your auto-injector pen:  Get more medicine for it right away. This is important in case you have another reaction.  Get help right away.  Wear a bracelet or necklace  that says you have an allergy, if your doctor tells you to do this. General instructions  Avoid medicines that you are allergic to.  Take over-the-counter and prescription medicines only as told by your doctor.  Do not drive until your doctor says it is safe.  If you have itchy, red, swollen areas of skin or a rash:  Use over-the-counter medicine (antihistamine) as told by your doctor.  Put cold, wet cloths (cold compresses) on your skin.  Take baths or showers in cool water. Avoid hot water.  If you had tests done, it is your responsibility to get your test results. Ask your doctor when your results will be ready.  Tell any doctors who care for you that you have a drug allergy.  Keep all follow-up visits as told by your doctor. This is important. Contact a doctor if:  You start to have any of these:  A stuffy nose.  Tingling in your mouth.  An itchy, red rash.  You have symptoms that last more than 2 days after your reaction.  Your symptoms get worse.  You get new symptoms. Get help right away if:  You had to use your auto-injector pen. You must go to the emergency room even if the medicine seems to be working.  You have any of these:  Swelling in your eyes, lips, face, or tongue.  Swelling in the back of your mouth or your throat.  Loud breathing.  A hoarse voice.  Itchy, red, swollen areas of skin.  Trouble with  breathing, talking, or swallowing.  A tight feeling in your chest.  A fast heartbeat.  You have throwing up that gets very bad.  You have watery poop that gets very bad.  You feel dizzy or light-headed.  You pass out. These symptoms may be an emergency. Do not wait to see if the symptoms will go away. Use your auto-injector pen or allergy kit as you have been told. Get medical help right away. Call your local emergency services (911 in the [place] Do not drive yourself to the hospital.  This information is not intended to replace advice  given to you by your health care provider. Make sure you discuss any questions you have with your health care provider. Document Released: 12/02/2004 Document Revised: 04/01/2016 Document Reviewed: 05/27/2015  2017 Elsevier  Carpal Tunnel Syndrome Introduction Carpal tunnel syndrome is a condition that causes pain in your hand and arm. The carpal tunnel is a narrow area that is on the palm side of your wrist. Repeated wrist motion or certain diseases may cause swelling in the tunnel. This swelling can pinch the main nerve in the wrist (median nerve). Follow these instructions at home: If you have a splint:  Wear it as told by your doctor. Remove it only as told by your doctor.  Loosen the splint if your fingers:  Become numb and tingle.  Turn blue and cold.  Keep the splint clean and dry. General instructions  Take over-the-counter and prescription medicines only as told by your doctor.  Rest your wrist from any activity that may be causing your pain. If needed, talk to your employer about changes that can be made in your work, such as getting a wrist pad to use while typing.  If directed, apply ice to the painful area:  Put ice in a plastic bag.  Place a towel between your skin and the bag.  Leave the ice on for 20 minutes, 2-3 times per day.  Keep all follow-up visits as told by your doctor. This is important.  Do any exercises as told by your doctor, physical therapist, or occupational therapist. Contact a doctor if:  You have new symptoms.  Medicine does not help your pain.  Your symptoms get worse. This information is not intended to replace advice given to you by your health care provider. Make sure you discuss any questions you have with your health care provider. Document Released: 10/14/2011 Document Revised: 04/01/2016 Document Reviewed: 03/12/2015  2017 Elsevier

## 2016-11-22 ENCOUNTER — Telehealth: Payer: Self-pay

## 2016-11-22 NOTE — Telephone Encounter (Signed)
-----   Message from Dorena Dew, [place] sent at 11/22/2016  1:21 PM EST ----- Regarding: lab results Please inform patient that hemoglobin a1C is consistent with prediabetes. Recommend a lowfat, low carbohydrate diet divided over 5-6 small meals, increase water intake to 6-8 glasses, and 150 minutes per week of cardiovascular exercise. Follow up in office as previously scheduled ----- Message ----- From: Interface, [facility] Sent: 11/19/2016   9:45 PM To: Dorena Dew, FNP

## 2016-11-22 NOTE — Telephone Encounter (Signed)
Called and spoke to, advised of prediabetes and that she needed to start low fat/ low carb diet. Advised to eat 5 to 6 small meals daily, drink 6 to 8 glasses of water, and exercise 150 minutes weekly. Advised patient to keep next appointment. Patient verbalized understanding. Thanks!

## 2016-12-01 MED FILL — ?AMLODIPINE BESYLATE 5 MG T: 5 | 30 days supply | Qty: 30 | Fill #11

## 2016-12-01 MED FILL — VALSARTAN-HCTZ 320-12.5 MG: 320-12.5 | 30 days supply | Qty: 30 | Fill #2

## 2016-12-01 MED FILL — ?PANTOPRAZOLE SOD DR 40MG: 40 MG | 30 days supply | Qty: 30 | Fill #2

## 2016-12-06 ENCOUNTER — Ambulatory Visit: Payer: Self-pay | Admitting: Family Medicine

## 2016-12-29 MED FILL — ?PANTOPRAZOLE SOD DR 40MG: 40 MG | 30 days supply | Qty: 30 | Fill #3

## 2016-12-29 MED FILL — VALSARTAN-HCTZ 320-12.5 MG: 320-12.5 | 30 days supply | Qty: 30 | Fill #3

## 2016-12-29 MED FILL — AMLODIPINE BESYLATE 5 MG TA: 5 | 30 days supply | Qty: 30 | Fill #0

## 2017-01-27 MED FILL — VALSARTAN-HCTZ 320-12.5 MG: 320-12.5 | 30 days supply | Qty: 30 | Fill #4

## 2017-01-27 MED FILL — ?PANTOPRAZOLE SOD DR 40MG: 40 MG | 30 days supply | Qty: 30 | Fill #4

## 2017-01-27 MED FILL — ?AMLODIPINE BESYLATE 5 MG T: 5 | 30 days supply | Qty: 30 | Fill #1

## 2017-02-14 ENCOUNTER — Encounter: Payer: Self-pay | Admitting: Family Medicine

## 2017-02-14 ENCOUNTER — Ambulatory Visit (INDEPENDENT_AMBULATORY_CARE_PROVIDER_SITE_OTHER): Payer: No Typology Code available for payment source | Admitting: Family Medicine

## 2017-02-14 VITALS — BP 136/88 | HR 92 | Temp 97.8°F | Resp 16 | Ht 63.0 in | Wt 215.0 lb

## 2017-02-14 DIAGNOSIS — R7303 Prediabetes: Secondary | ICD-10-CM

## 2017-02-14 DIAGNOSIS — M79671 Pain in right foot: Secondary | ICD-10-CM

## 2017-02-14 DIAGNOSIS — M79672 Pain in left foot: Secondary | ICD-10-CM

## 2017-02-14 LAB — POCT URINALYSIS DIP (DEVICE)
BILIRUBIN URINE: NEGATIVE
GLUCOSE, UA: NEGATIVE mg/dL
Hgb urine dipstick: NEGATIVE
KETONES UR: NEGATIVE mg/dL
LEUKOCYTES UA: NEGATIVE
Nitrite: NEGATIVE
PROTEIN: NEGATIVE mg/dL
SPECIFIC GRAVITY, URINE: 1.02 (ref 1.005–1.030)
Urobilinogen, UA: 0.2 mg/dL (ref 0.0–1.0)
pH: 5 (ref 5.0–8.0)

## 2017-02-14 LAB — POCT GLYCOSYLATED HEMOGLOBIN (HGB A1C): HEMOGLOBIN A1C: 5.7

## 2017-02-14 MED ORDER — MELOXICAM 15 MG PO TABS: 15.0000 mg | ORAL_TABLET | Freq: Every day | ORAL | 0 refills | Status: DC

## 2017-02-14 MED FILL — ?MELOXICAM 15 MG TAB: 15 MG | 30 days supply | Qty: 30 | Fill #0

## 2017-02-14 NOTE — Progress Notes (Deleted)
Patient ID: Deanna Floyd, female    DOB: [birth year], 56 y.o.   MRN: [id number]  PCP: Sharon Seller, NP  Chief Complaint  Patient presents with  . Follow-up    6 month  . Foot Pain    both feet    Subjective:  HPI  Deanna Floyd is a 56 y.o. female presents for a diabetes follow-up and complaint of bilateral foot pain. Chronic medical problems include: T2DM, Hypertension, Dermatitis, Carpal Tunnel,  Left foot prior surgery.  Social History   Social History  . Marital status: Single    Spouse name: N/A  . Number of children: N/A  . Years of education: N/A   Occupational History  . Not on file.   Social History Main Topics  . Smoking status: Former Research scientist (life sciences)  . Smokeless tobacco: Never Used  . Alcohol use Yes     Comment: occ  . Drug use: No  . Sexual activity: No   Other Topics Concern  . Not on file   Social History Narrative  . No narrative on file    Family History  Problem Relation Age of Onset  . Hypertension Sister    Review of Systems  Patient Active Problem List   Diagnosis Date Noted  . Carpal tunnel syndrome on both sides 11/19/2016  . Metabolic syndrome 77/82/4235  . Essential hypertension 11/19/2016  . Allergic dermatitis 11/19/2016  . Allergy status to unspecified drugs, medicaments and biological substances status 11/19/2016  . BP check 09/22/2016  . Post-operative state 06/02/2016  . Obesity 01/08/2016  . Low back pain 10/06/2015  . Pain in joint, ankle and foot 10/06/2015    Allergies  Allergen Reactions  . Hydrocortisone Hives  . Lyrica [Pregabalin] Rash    Prior to Admission medications   Medication Sig Start Date End Date Taking? Authorizing Provider  amLODipine (NORVASC) 5 MG tablet Take 1 tablet (5 mg total) by mouth daily. 06/07/16  Yes Micheline Chapman, NP  cetirizine (ZYRTEC) 10 MG tablet Take 1 tablet (10 mg total) by mouth daily. 11/19/16  Yes Dorena Dew, FNP  diclofenac (VOLTAREN) 75 MG EC tablet Take 1  tablet (75 mg total) by mouth 2 (two) times daily. 10/06/15  Yes Micheline Chapman, NP  gabapentin (NEURONTIN) 300 MG capsule Take 1 capsule (300 mg total) by mouth at bedtime. 08/31/16  Yes Titorya Stover, DPM  meloxicam (MOBIC) 15 MG tablet Take 1 tablet (15 mg total) by mouth daily. 01/12/16  Yes Landis Martins, DPM  methylPREDNISolone (MEDROL DOSEPAK) 4 MG TBPK tablet Take as instructed 06/01/16  Yes Landis Martins, DPM  Multiple Vitamin (MULTIVITAMIN WITH MINERALS) TABS tablet Take 1 tablet by mouth daily.   Yes Historical Provider, MD  pantoprazole (PROTONIX) 40 MG tablet Take 1 tablet (40 mg total) by mouth daily. 06/07/16  Yes Micheline Chapman, NP  PRESCRIPTION MEDICATION Take 1 tablet by mouth daily. Reported on 01/08/2016   Yes Historical Provider, MD  triamcinolone cream (KENALOG) 0.1 % Apply 1 application topically 2 (two) times daily. 11/19/16  Yes Dorena Dew, FNP  valsartan-hydrochlorothiazide (DIOVAN HCT) 320-12.5 MG tablet Take 1 tablet by mouth daily. 06/07/16  Yes Micheline Chapman, NP    Past Medical, Surgical Family and Social History reviewed and updated.    Objective:   Today's Vitals   02/14/17 1551  BP: (!) 146/88  Pulse: 92  Resp: 16  Temp: 97.8 F (36.6 C)  TempSrc: Oral  SpO2: 99%  Weight: 215 lb (97.5 kg)  Height: 5\' 3"  (1.6 m)    Wt Readings from Last 3 Encounters:  02/14/17 215 lb (97.5 kg)  11/19/16 217 lb (98.4 kg)  08/10/16 216 lb (98 kg)    Physical Exam         Assessment & Plan:  There are no diagnoses linked to this encounter.    [name]. [name], MSN, [facility] [address] [place],  [address] [phone]

## 2017-02-14 NOTE — Progress Notes (Deleted)
   Patient ID: Deanna Floyd, female    DOB: 1961-05-08, 56 y.o.   MRN: [id number]  PCP: Molli Barrows, FNP  Chief Complaint  Patient presents with  . Follow-up    6 month  . Foot Pain    both feet    Subjective:   HPI Presents for evaluation of ***.  ***   Dr. Cannon Kettle is her podiatrist    Review of Systems See HPI    Patient Active Problem List   Diagnosis Date Noted  . Carpal tunnel syndrome on both sides 11/19/2016  . Metabolic syndrome 28/00/3491  . Essential hypertension 11/19/2016  . Allergic dermatitis 11/19/2016  . Allergy status to unspecified drugs, medicaments and biological substances status 11/19/2016  . BP check 09/22/2016  . Post-operative state 06/02/2016  . Obesity 01/08/2016  . Low back pain 10/06/2015  . Pain in joint, ankle and foot 10/06/2015     Prior to Admission medications   Medication Sig Start Date End Date Taking? Authorizing Provider  amLODipine (NORVASC) 5 MG tablet Take 1 tablet (5 mg total) by mouth daily. 06/07/16  Yes Micheline Chapman, NP  cetirizine (ZYRTEC) 10 MG tablet Take 1 tablet (10 mg total) by mouth daily. 11/19/16  Yes Dorena Dew, FNP  diclofenac (VOLTAREN) 75 MG EC tablet Take 1 tablet (75 mg total) by mouth 2 (two) times daily. 10/06/15  Yes Micheline Chapman, NP  gabapentin (NEURONTIN) 300 MG capsule Take 1 capsule (300 mg total) by mouth at bedtime. 08/31/16  Yes Titorya Stover, DPM  meloxicam (MOBIC) 15 MG tablet Take 1 tablet (15 mg total) by mouth daily. 01/12/16  Yes Landis Martins, DPM  methylPREDNISolone (MEDROL DOSEPAK) 4 MG TBPK tablet Take as instructed 06/01/16  Yes Landis Martins, DPM  Multiple Vitamin (MULTIVITAMIN WITH MINERALS) TABS tablet Take 1 tablet by mouth daily.   Yes Historical Provider, MD  pantoprazole (PROTONIX) 40 MG tablet Take 1 tablet (40 mg total) by mouth daily. 06/07/16  Yes Micheline Chapman, NP  PRESCRIPTION MEDICATION Take 1 tablet by mouth daily. Reported on 01/08/2016   Yes  Historical Provider, MD  triamcinolone cream (KENALOG) 0.1 % Apply 1 application topically 2 (two) times daily. 11/19/16  Yes Dorena Dew, FNP  valsartan-hydrochlorothiazide (DIOVAN HCT) 320-12.5 MG tablet Take 1 tablet by mouth daily. 06/07/16  Yes Micheline Chapman, NP     Allergies  Allergen Reactions  . Hydrocortisone Hives  . Lyrica [Pregabalin] Rash       Objective:  Physical Exam         Assessment & Plan:   ***

## 2017-02-14 NOTE — Progress Notes (Signed)
Patient ID: Deanna Floyd, female    DOB: May 17, 1961, 56 y.o.   MRN: [id number]  PCP: Molli Barrows, FNP  Chief Complaint  Patient presents with  . Follow-up    6 month  . Foot Pain    both feet    Subjective:  HPI Deanna Floyd is a 55 y.o. female presents for a routine 6 month follow-up and evaluation of chronic foot pain. Medical problems include: Prediabetes, Hypertension, foot pain, and low back pain.  Deanna Floyd reports that overall good health. She is concerned about diabetes and interested in getting information about better eating in order to prevent diabetes. Her last A1C 5.9. Reports compliance with antihypertension medications. No routine monitoring of blood pressure at home.  Foot Pain She reports years ago she had a procedure performed and in which metal plates were placed in toes due to "crumbling bones". 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 She has some neuropathic pain is currently taking gabapentin which she reports is not effective relief for her current foot pain. She has taken Meloxicam in the past for arthritis pain and reports that it was effective. She has seen Dr. Cannon Kettle and Dr. Honor Junes with podiatry.   Social History   Social History  . Marital status: Single    Spouse name: N/A  . Number of children: N/A  . Years of education: N/A   Occupational History  . Not on file.   Social History Main Topics  . Smoking status: Former Research scientist (life sciences)  . Smokeless tobacco: Never Used  . Alcohol use Yes     Comment: occ  . Drug use: No  . Sexual activity: No   Other Topics Concern  . Not on file   Social History Narrative  . No narrative on file    Family History  Problem Relation Age of Onset  . Hypertension Sister    Review of Systems See HPI  Patient Active Problem List   Diagnosis Date Noted  . Carpal tunnel syndrome on both sides  11/19/2016  . Metabolic syndrome 86/76/7209  . Essential hypertension 11/19/2016  . Allergic dermatitis 11/19/2016  . Allergy status to unspecified drugs, medicaments and biological substances status 11/19/2016  . BP check 09/22/2016  . Post-operative state 06/02/2016  . Obesity 01/08/2016  . Low back pain 10/06/2015  . Pain in joint, ankle and foot 10/06/2015    Allergies  Allergen Reactions  . Hydrocortisone Hives  . Lyrica [Pregabalin] Rash    Prior to Admission medications   Medication Sig Start Date End Date Taking? Authorizing Provider  amLODipine (NORVASC) 5 MG tablet Take 1 tablet (5 mg total) by mouth daily. 06/07/16  Yes Micheline Chapman, NP  cetirizine (ZYRTEC) 10 MG tablet Take 1 tablet (10 mg total) by mouth daily. 11/19/16  Yes Dorena Dew, FNP  diclofenac (VOLTAREN) 75 MG EC tablet Take 1 tablet (75 mg total) by mouth 2 (two) times daily. 10/06/15  Yes Micheline Chapman, NP  gabapentin (NEURONTIN) 300 MG capsule Take 1 capsule (300 mg total) by mouth at bedtime. 08/31/16  Yes Landis Martins, DPM  methylPREDNISolone (MEDROL DOSEPAK) 4 MG TBPK tablet Take as instructed 06/01/16  Yes Landis Martins, DPM  Multiple Vitamin (MULTIVITAMIN WITH MINERALS) TABS tablet Take 1 tablet by mouth daily.   Yes Historical Provider, MD  pantoprazole (PROTONIX) 40 MG tablet Take 1 tablet (40 mg total) by mouth  daily. 06/07/16  Yes Micheline Chapman, NP  PRESCRIPTION MEDICATION Take 1 tablet by mouth daily. Reported on 01/08/2016   Yes Historical Provider, MD  triamcinolone cream (KENALOG) 0.1 % Apply 1 application topically 2 (two) times daily. 11/19/16  Yes Dorena Dew, FNP  valsartan-hydrochlorothiazide (DIOVAN HCT) 320-12.5 MG tablet Take 1 tablet by mouth daily. 06/07/16  Yes Micheline Chapman, NP  meloxicam (MOBIC) 15 MG tablet Take 1 tablet (15 mg total) by mouth daily. 02/14/17   Sedalia Muta, FNP    Past Medical, Surgical Family and Social History reviewed and  updated.    Objective:   Today's Vitals   02/14/17 1551 02/14/17 1622  BP: (!) 146/88 136/88  Pulse: 92   Resp: 16   Temp: 97.8 F (36.6 C)   TempSrc: Oral   SpO2: 99%   Weight: 215 lb (97.5 kg)   Height: 5\' 3"  (1.6 m)     Wt Readings from Last 3 Encounters:  02/14/17 215 lb (97.5 kg)  11/19/16 217 lb (98.4 kg)  08/10/16 216 lb (98 kg)    Physical Exam  Constitutional: She is oriented to person, place, and time. She appears well-developed and well-nourished.  HENT:  Head: Normocephalic and atraumatic.  Eyes: Conjunctivae and EOM are normal. Pupils are equal, round, and reactive to light.  Neck: Normal range of motion. Neck supple.  Cardiovascular: Normal rate, regular rhythm, normal heart sounds and intact distal pulses.   Pulmonary/Chest: Effort normal and breath sounds normal.  Musculoskeletal: She exhibits edema and tenderness.  Bilateral feet. Bottom midfoot and heel pain most pronounced.  Neurological: She is alert and oriented to person, place, and time.  Skin: Skin is warm and dry.  Psychiatric: She has a normal mood and affect. Her behavior is normal.      Assessment & Plan:  1. Prediabetes - POCT glycosylated hemoglobin (Hb A1C)-5.7 today -I have provided you with information to improve your overall cardiovascular through diet.  2. Foot Pain, Bilateral, symptoms consistent with plantar fasciitis. -Start a trial of antiinflammatories. If no improvement, will consider a referral back to  podiatry.   [name]. [name], MSN, [facility] [address] [place], [place] [address] [phone]

## 2017-02-14 NOTE — Patient Instructions (Addendum)
Take Meloxicam 15 mg once daily as needed for foot pain. Your prescription has been sent to [place].    Fat and Cholesterol Restricted Diet Getting too much fat and cholesterol in your diet may cause health problems. Following this diet helps keep your fat and cholesterol at normal levels. This can keep you from getting sick. What types of fat should I choose?  Choose monosaturated and polyunsaturated fats. These are found in foods such as olive oil, canola oil, flaxseeds, walnuts, almonds, and seeds.  Eat more omega-3 fats. Good choices include salmon, mackerel, sardines, tuna, flaxseed oil, and ground flaxseeds.  Limit saturated fats. These are in animal products such as meats, butter, and cream. They can also be in plant products such as palm oil, palm kernel oil, and coconut oil.  Avoid foods with partially hydrogenated oils in them. These contain trans fats. Examples of foods that have trans fats are stick margarine, some tub margarines, cookies, crackers, and other baked goods. What general guidelines do I need to follow?  Check food labels. Look for the words "trans fat" and "saturated fat."  When preparing a meal:  Fill half of your plate with vegetables and green salads.  Fill one fourth of your plate with whole grains. Look for the word "whole" as the first word in the ingredient list.  Fill one fourth of your plate with lean protein foods.  Eat more foods that have fiber, like apples, carrots, beans, peas, and barley.  Eat more home-cooked foods. Eat less at restaurants and buffets.  Limit or avoid alcohol.  Limit foods high in starch and sugar.  Limit fried foods.  Cook foods without frying them. Baking, boiling, grilling, and broiling are all great options.  Lose weight if you are overweight. Losing even a small amount of weight can help your overall health. It can also help prevent diseases such as diabetes and heart disease. What foods can  I eat? Grains  Whole grains, such as whole wheat or whole grain breads, crackers, cereals, and pasta. Unsweetened oatmeal, bulgur, barley, quinoa, or brown rice. Corn or whole wheat flour tortillas. Vegetables  Fresh or frozen vegetables (raw, steamed, roasted, or grilled). Green salads. Fruits  All fresh, canned (in natural juice), or frozen fruits. Meat and Other Protein Products  Ground beef (85% or leaner), grass-fed beef, or beef trimmed of fat. Skinless chicken or [place]. Ground chicken or [place]. Pork trimmed of fat. All fish and seafood. Eggs. Dried beans, peas, or lentils. Unsalted nuts or seeds. Unsalted canned or dry beans. Dairy  Low-fat dairy products, such as skim or 1% milk, 2% or reduced-fat cheeses, low-fat ricotta or cottage cheese, or plain low-fat yogurt. Fats and Oils  Tub margarines without trans fats. Light or reduced-fat mayonnaise and salad dressings. Avocado. Olive, canola, sesame, or safflower oils. Natural peanut or almond butter (choose ones without added sugar and oil). The items listed above may not be a complete list of recommended foods or beverages. Contact your dietitian for more options.  What foods are not recommended? Grains  White bread. White pasta. White rice. Cornbread. Bagels, pastries, and croissants. Crackers that contain trans fat. Vegetables  White potatoes. Corn. Creamed or fried vegetables. Vegetables in a cheese sauce. Fruits  Dried fruits. Canned fruit in light or heavy syrup. Fruit juice. Meat and Other Protein Products  Fatty cuts of meat. Ribs, chicken wings, bacon, sausage, bologna, salami, chitterlings, fatback, hot dogs, bratwurst, and packaged luncheon meats. Liver and organ meats.  Dairy  Whole or 2% milk, cream, half-and-half, and cream cheese. Whole milk cheeses. Whole-fat or sweetened yogurt. Full-fat cheeses. Nondairy creamers and whipped toppings. Processed cheese, cheese spreads, or cheese curds. Sweets and Desserts  Corn  syrup, sugars, honey, and molasses. Candy. Jam and jelly. Syrup. Sweetened cereals. Cookies, pies, cakes, donuts, muffins, and ice cream. Fats and Oils  Butter, stick margarine, lard, shortening, ghee, or bacon fat. Coconut, palm kernel, or palm oils. Beverages  Alcohol. Sweetened drinks (such as sodas, lemonade, and fruit drinks or punches). The items listed above may not be a complete list of foods and beverages to avoid. Contact your dietitian for more information.  This information is not intended to replace advice given to you by your health care provider. Make sure you discuss any questions you have with your health care provider. Document Released: 04/25/2012 Document Revised: 07/01/2016 Document Reviewed: 01/24/2014 Elsevier Interactive Patient Education  2017 [place] Heart-healthy meal planning includes:  Limiting unhealthy fats.  Increasing healthy fats.  Making other small dietary changes. You may need to talk with your doctor or a diet specialist (dietitian) to create an eating plan that is right for you. What types of fat should I choose?  Choose healthy fats. These include olive oil and canola oil, flaxseeds, walnuts, almonds, and seeds.  Eat more omega-3 fats. These include salmon, mackerel, sardines, tuna, flaxseed oil, and ground flaxseeds. Try to eat fish at least twice each week.  Limit saturated fats.  Saturated fats are often found in animal products, such as meats, butter, and cream.  Plant sources of saturated fats include palm oil, palm kernel oil, and coconut oil.  Avoid foods with partially hydrogenated oils in them. These include stick margarine, some tub margarines, cookies, crackers, and other baked goods. These contain trans fats. What general guidelines do I need to follow?  Check food labels carefully. Identify foods with trans fats or high amounts of saturated fat.  Fill one half of your plate with vegetables and  green salads. Eat 4-5 servings of vegetables per day. A serving of vegetables is:  1 cup of raw leafy vegetables.   cup of raw or cooked cut-up vegetables.   cup of vegetable juice.  Fill one fourth of your plate with whole grains. Look for the word "whole" as the first word in the ingredient list.  Fill one fourth of your plate with lean protein foods.  Eat 4-5 servings of fruit per day. A serving of fruit is:  One medium whole fruit.   cup of dried fruit.   cup of fresh, frozen, or canned fruit.   cup of 100% fruit juice.  Eat more foods that contain soluble fiber. These include apples, broccoli, carrots, beans, peas, and barley. Try to get 20-30 g of fiber per day.  Eat more home-cooked food. Eat less restaurant, buffet, and fast food.  Limit or avoid alcohol.  Limit foods high in starch and sugar.  Avoid fried foods.  Avoid frying your food. Try baking, boiling, grilling, or broiling it instead. You can also reduce fat by:  Removing the skin from poultry.  Removing all visible fats from meats.  Skimming the fat off of stews, soups, and gravies before serving them.  Steaming vegetables in water or broth.  Lose weight if you are overweight.  Eat 4-5 servings of nuts, legumes, and seeds per week:  One serving of dried beans or legumes equals  cup after being cooked.  One serving  of nuts equals 1 ounces.  One serving of seeds equals  ounce or one tablespoon.  You may need to keep track of how much salt or sodium you eat. This is especially true if you have high blood pressure. Talk with your doctor or dietitian to get more information. What foods can I eat? Grains  Breads, including [place], white, pita, wheat, raisin, rye, oatmeal, and [place]. Tortillas that are neither fried nor made with lard or trans fat. Low-fat rolls, including hotdog and hamburger buns and English muffins. Biscuits. Muffins. Waffles. Pancakes. Light popcorn. Whole-grain cereals.  Flatbread. Melba toast. Pretzels. Breadsticks. Rusks. Low-fat snacks. Low-fat crackers, including oyster, saltine, matzo, graham, animal, and rye. Rice and pasta, including brown rice and pastas that are made with whole wheat. Vegetables  All vegetables. Fruits  All fruits, but limit coconut. Meats and Other Protein Sources  Lean, well-trimmed beef, veal, pork, and lamb. Chicken and [place] without skin. All fish and shellfish. Wild duck, rabbit, pheasant, and venison. Egg whites or low-cholesterol egg substitutes. Dried beans, peas, lentils, and tofu. Seeds and most nuts. Dairy  Low-fat or nonfat cheeses, including ricotta, string, and mozzarella. Skim or 1% milk that is liquid, powdered, or evaporated. Buttermilk that is made with low-fat milk. Nonfat or low-fat yogurt. Beverages  Mineral water. Diet carbonated beverages. Sweets and Desserts  Sherbets and fruit ices. Honey, jam, marmalade, jelly, and syrups. Meringues and gelatins. Pure sugar candy, such as hard candy, jelly beans, gumdrops, mints, marshmallows, and small amounts of dark chocolate. [employer] Eat all sweets and desserts in moderation. Fats and Oils  Nonhydrogenated (trans-free) margarines. Vegetable oils, including soybean, sesame, sunflower, olive, peanut, safflower, corn, canola, and cottonseed. Salad dressings or mayonnaise made with a vegetable oil. Limit added fats and oils that you use for cooking, baking, salads, and as spreads. Other  Cocoa powder. Coffee and tea. All seasonings and condiments. The items listed above may not be a complete list of recommended foods or beverages. Contact your dietitian for more options.  What foods are not recommended? Grains  Breads that are made with saturated or trans fats, oils, or whole milk. Croissants. Butter rolls. Cheese breads. Sweet rolls. Donuts. Buttered popcorn. Chow mein noodles. High-fat crackers, such as cheese or butter crackers. Meats and Other Protein Sources   Fatty meats, such as hotdogs, short ribs, sausage, spareribs, bacon, rib eye roast or steak, and mutton. High-fat deli meats, such as salami and bologna. Caviar. Domestic duck and goose. Organ meats, such as kidney, liver, sweetbreads, and heart. Dairy  Cream, sour cream, cream cheese, and creamed cottage cheese. Whole-milk cheeses, including blue (bleu), Monte[place] Jack, [place], [place], [place], [place], Swiss, cheddar, [place], and [place]. Whole or 2% milk that is liquid, evaporated, or condensed. Whole buttermilk. Cream sauce or high-fat cheese sauce. Yogurt that is made from whole milk. Beverages  Regular sodas and juice drinks with added sugar. Sweets and Desserts  Frosting. Pudding. Cookies. Cakes other than angel food cake. Candy that has milk chocolate or white chocolate, hydrogenated fat, butter, coconut, or unknown ingredients. Buttered syrups. Full-fat ice cream or ice cream drinks. Fats and Oils  Gravy that has suet, meat fat, or shortening. Cocoa butter, hydrogenated oils, palm oil, coconut oil, palm kernel oil. These can often be found in baked products, candy, fried foods, nondairy creamers, and whipped toppings. Solid fats and shortenings, including bacon fat, salt pork, lard, and butter. Nondairy cream substitutes, such as coffee creamers and sour cream substitutes. Salad dressings that are  made of unknown oils, cheese, or sour cream. The items listed above may not be a complete list of foods and beverages to avoid. Contact your dietitian for more information.  This information is not intended to replace advice given to you by your health care provider. Make sure you discuss any questions you have with your health care provider. Document Released: 04/25/2012 Document Revised: 04/01/2016 Document Reviewed: 04/18/2014 Elsevier Interactive Patient Education  2017 [place] DASH stands for "Dietary Approaches to Stop Hypertension." The DASH eating plan is a  healthy eating plan that has been shown to reduce high blood pressure (hypertension). It may also reduce your risk for type 2 diabetes, heart disease, and stroke. The DASH eating plan may also help with weight loss. What are tips for following this plan? General guidelines   Avoid eating more than 2,300 mg (milligrams) of salt (sodium) a day. If you have hypertension, you may need to reduce your sodium intake to 1,500 mg a day.  Limit alcohol intake to no more than 1 drink a day for nonpregnant women and 2 drinks a day for men. One drink equals 12 oz of beer, 5 oz of wine, or 1 oz of hard liquor.  Work with your health care provider to maintain a healthy body weight or to lose weight. Ask what an ideal weight is for you.  Get at least 30 minutes of exercise that causes your heart to beat faster (aerobic exercise) most days of the week. Activities may include walking, swimming, or biking.  Work with your health care provider or diet and nutrition specialist (dietitian) to adjust your eating plan to your individual calorie needs. Reading food labels   Check food labels for the amount of sodium per serving. Choose foods with less than 5 percent of the Daily Value of sodium. Generally, foods with less than 300 mg of sodium per serving fit into this eating plan.  To find whole grains, look for the word "whole" as the first word in the ingredient list. Shopping   Buy products labeled as "low-sodium" or "no salt added."  Buy fresh foods. Avoid canned foods and premade or frozen meals. Cooking   Avoid adding salt when cooking. Use salt-free seasonings or herbs instead of table salt or sea salt. Check with your health care provider or pharmacist before using salt substitutes.  Do not fry foods. Cook foods using healthy methods such as baking, boiling, grilling, and broiling instead.  Cook with heart-healthy oils, such as olive, canola, soybean, or sunflower oil. Meal planning    Eat a  balanced diet that includes:  5 or more servings of fruits and vegetables each day. At each meal, try to fill half of your plate with fruits and vegetables.  Up to 6-8 servings of whole grains each day.  Less than 6 oz of lean meat, poultry, or fish each day. A 3-oz serving of meat is about the same size as a deck of cards. One egg equals 1 oz.  2 servings of low-fat dairy each day.  A serving of nuts, seeds, or beans 5 times each week.  Heart-healthy fats. Healthy fats called Omega-3 fatty acids are found in foods such as flaxseeds and coldwater fish, like sardines, salmon, and mackerel.  Limit how much you eat of the following:  Canned or prepackaged foods.  Food that is high in trans fat, such as fried foods.  Food that is high in saturated fat, such as fatty meat.  Sweets, desserts, sugary drinks, and other foods with added sugar.  Full-fat dairy products.  Do not salt foods before eating.  Try to eat at least 2 vegetarian meals each week.  Eat more home-cooked food and less restaurant, buffet, and fast food.  When eating at a restaurant, ask that your food be prepared with less salt or no salt, if possible. What foods are recommended? The items listed may not be a complete list. Talk with your dietitian about what dietary choices are best for you. Grains  Whole-grain or whole-wheat bread. Whole-grain or whole-wheat pasta. Brown rice. [place]. Bulgur. Whole-grain and low-sodium cereals. Pita bread. Low-fat, low-sodium crackers. Whole-wheat flour tortillas. Vegetables  Fresh or frozen vegetables (raw, steamed, roasted, or grilled). Low-sodium or reduced-sodium tomato and vegetable juice. Low-sodium or reduced-sodium tomato sauce and tomato paste. Low-sodium or reduced-sodium canned vegetables. Fruits  All fresh, dried, or frozen fruit. Canned fruit in natural juice (without added sugar). Meat and other protein foods  Skinless chicken or [place]. Ground chicken or  [place]. Pork with fat trimmed off. Fish and seafood. Egg whites. Dried beans, peas, or lentils. Unsalted nuts, nut butters, and seeds. Unsalted canned beans. Lean cuts of beef with fat trimmed off. Low-sodium, lean deli meat. Dairy  Low-fat (1%) or fat-free (skim) milk. Fat-free, low-fat, or reduced-fat cheeses. Nonfat, low-sodium ricotta or cottage cheese. Low-fat or nonfat yogurt. Low-fat, low-sodium cheese. Fats and oils  Soft margarine without trans fats. Vegetable oil. Low-fat, reduced-fat, or light mayonnaise and salad dressings (reduced-sodium). Canola, safflower, olive, soybean, and sunflower oils. Avocado. Seasoning and other foods  Herbs. Spices. Seasoning mixes without salt. Unsalted popcorn and pretzels. Fat-free sweets. What foods are not recommended? The items listed may not be a complete list. Talk with your dietitian about what dietary choices are best for you. Grains  Baked goods made with fat, such as croissants, muffins, or some breads. Dry pasta or rice meal packs. Vegetables  Creamed or fried vegetables. Vegetables in a cheese sauce. Regular canned vegetables (not low-sodium or reduced-sodium). Regular canned tomato sauce and paste (not low-sodium or reduced-sodium). Regular tomato and vegetable juice (not low-sodium or reduced-sodium). Angie Fava. Olives. Fruits  Canned fruit in a light or heavy syrup. Fried fruit. Fruit in cream or butter sauce. Meat and other protein foods  Fatty cuts of meat. Ribs. Fried meat. Berniece Salines. Sausage. Bologna and other processed lunch meats. Salami. Fatback. Hotdogs. Bratwurst. Salted nuts and seeds. Canned beans with added salt. Canned or smoked fish. Whole eggs or egg yolks. Chicken or [place] with skin. Dairy  Whole or 2% milk, cream, and half-and-half. Whole or full-fat cream cheese. Whole-fat or sweetened yogurt. Full-fat cheese. Nondairy creamers. Whipped toppings. Processed cheese and cheese spreads. Fats and oils  Butter. Stick margarine.  Lard. Shortening. Ghee. Bacon fat. Tropical oils, such as coconut, palm kernel, or palm oil. Seasoning and other foods  Salted popcorn and pretzels. Onion salt, garlic salt, seasoned salt, table salt, and sea salt. Worcestershire sauce. Tartar sauce. Barbecue sauce. Teriyaki sauce. Soy sauce, including reduced-sodium. Steak sauce. Canned and packaged gravies. Fish sauce. Oyster sauce. Cocktail sauce. Horseradish that you find on the shelf. Ketchup. Mustard. Meat flavorings and tenderizers. Bouillon cubes. Hot sauce and Tabasco sauce. Premade or packaged marinades. Premade or packaged taco seasonings. Relishes. Regular salad dressings. Where to find more information:  National Heart, Lung, and [place]: https://wilson-eaton.com/  American Heart Association: www.heart.org Summary  The DASH eating plan is a healthy eating plan that has been shown to reduce  high blood pressure (hypertension). It may also reduce your risk for type 2 diabetes, heart disease, and stroke.  With the DASH eating plan, you should limit salt (sodium) intake to 2,300 mg a day. If you have hypertension, you may need to reduce your sodium intake to 1,500 mg a day.  When on the DASH eating plan, aim to eat more fresh fruits and vegetables, whole grains, lean proteins, low-fat dairy, and heart-healthy fats.  Work with your health care provider or diet and nutrition specialist (dietitian) to adjust your eating plan to your individual calorie needs. This information is not intended to replace advice given to you by your health care provider. Make sure you discuss any questions you have with your health care provider. Document Released: 10/14/2011 Document Revised: 10/18/2016 Document Reviewed: 10/18/2016 Elsevier Interactive Patient Education  2017 [employer].

## 2017-02-28 MED FILL — VALSARTAN-HCTZ 320-12.5 MG: 320-12.5 | 30 days supply | Qty: 30 | Fill #5

## 2017-02-28 MED FILL — PANTOPRAZOLE SOD DR 40 MG T: 40 | 30 days supply | Qty: 30 | Fill #5

## 2017-02-28 MED FILL — ?AMLODIPINE BESYLATE 5 MG T: 5 | 30 days supply | Qty: 30 | Fill #2

## 2017-03-14 ENCOUNTER — Ambulatory Visit: Payer: No Typology Code available for payment source | Attending: Internal Medicine

## 2017-03-31 MED FILL — PANTOPRAZOLE SOD DR 40 MG T: 40 | 30 days supply | Qty: 30 | Fill #6

## 2017-03-31 MED FILL — ?AMLODIPINE BESYLATE 5 MG T: 5 | 30 days supply | Qty: 30 | Fill #3

## 2017-03-31 MED FILL — VALSARTAN-HCTZ 320-12.5 MG: 320-12.5 | 30 days supply | Qty: 30 | Fill #6

## 2017-05-03 MED FILL — ?AMLODIPINE BESYLATE 5 MG T: 5 | 30 days supply | Qty: 30 | Fill #4

## 2017-05-03 MED FILL — VALSARTAN-HCTZ 320-12.5 MG: 320-12.5 | 30 days supply | Qty: 30 | Fill #7

## 2017-05-03 MED FILL — ?PANTOPRAZOLE SOD DR 40MG: 40 MG | 30 days supply | Qty: 30 | Fill #7

## 2017-05-04 ENCOUNTER — Encounter: Payer: Self-pay | Admitting: Podiatry

## 2017-05-04 ENCOUNTER — Ambulatory Visit (INDEPENDENT_AMBULATORY_CARE_PROVIDER_SITE_OTHER): Payer: No Typology Code available for payment source | Admitting: Podiatry

## 2017-05-04 ENCOUNTER — Ambulatory Visit: Payer: No Typology Code available for payment source

## 2017-05-04 DIAGNOSIS — M79672 Pain in left foot: Secondary | ICD-10-CM

## 2017-05-04 DIAGNOSIS — M19072 Primary osteoarthritis, left ankle and foot: Secondary | ICD-10-CM

## 2017-05-04 NOTE — Progress Notes (Signed)
   HPI: 56 year old female presents today for evaluation of left foot pain secondary to surgery was performed in 2015. Patient was last seen by Dr. Cannon Kettle on 08/31/2016. At that time the patient was requesting permanent disability Dr. Cannon Kettle referred her to her primary care physician. Dr. Cannon Kettle also recommended a functional capacity test performed by physical therapy. Patient presents again today to request permanent disability regarding her left foot symptoms  Patient states that she had bunion and hammertoe surgery to the left foot performed in [place] in 2015 is experienced pain and tenderness of her symptoms. The pain radiates up her leg.   Physical Exam: General: The patient is alert and oriented x3 in no acute distress.  Dermatology: Skin is warm, dry and supple bilateral lower extremities. Negative for open lesions or macerations. There are surgical scars overlying the first and second MPJs of the left foot consistent with the patient's history of bunion and hammertoe surgery  Vascular: Palpable pedal pulses bilaterally. No edema or erythema noted. Capillary refill within normal limits.  Neurological: Epicritic and protective threshold grossly intact bilaterally.   Musculoskeletal Exam: Patient is very guarded on palpation. Range of motion within normal limits to all pedal and ankle joints bilateral. Muscle strength 5/5 in all groups bilateral. Diffuse pain on palpation noted throughout the pedal structures of the foot and left ankle  Radiographic Exam:  Normal osseous mineralization. Joint spaces preserved. No fracture/dislocation/boney destruction.  Orthopedic staple noted to the proximal phalanx left great toe as well as an intact orthopedic screw to the head of the second metatarsal left foot. Orthopedic hardware appears intact. There is some degenerative changes of the first metatarsal phalangeal joint left foot.  Assessment: 1. History of left forefoot reconstructive surgery 2015  in [place] 2. Diffuse foot and ankle pain left lower extremity  Plan of Care:  1. Patient was evaluated. 2. Today I recommend that the patient had a functional capacity test performed by physical therapy to evaluate the extent of her permanent disability 3. Recommend follow-up with primary care doctor for discussion about permanent disability, since I am more of the consulting physician and did not have a long-standing established history with the patient 4. Return to clinic when necessary   Deanna Floyd, Deanna Floyd [facility]  Dr. Edrick Floyd, [place]                                        Deanna Floyd, Deanna Floyd [address]                Office [phone]  Fax [phone]

## 2017-05-05 ENCOUNTER — Other Ambulatory Visit: Payer: Self-pay | Admitting: Family Medicine

## 2017-05-05 ENCOUNTER — Encounter: Payer: Self-pay | Admitting: Family Medicine

## 2017-05-05 ENCOUNTER — Encounter (HOSPITAL_COMMUNITY): Payer: Self-pay

## 2017-05-05 ENCOUNTER — Ambulatory Visit (INDEPENDENT_AMBULATORY_CARE_PROVIDER_SITE_OTHER): Payer: No Typology Code available for payment source | Admitting: Family Medicine

## 2017-05-05 ENCOUNTER — Ambulatory Visit (HOSPITAL_COMMUNITY)
Admission: RE | Admit: 2017-05-05 | Discharge: 2017-05-05 | Disposition: A | Discharge status: Home / Self Care | Payer: No Typology Code available for payment source | Source: Ambulatory Visit | Attending: Family Medicine | Admitting: Family Medicine

## 2017-05-05 VITALS — BP 129/87 | HR 97 | Temp 98.2°F | Ht 63.0 in | Wt 212.1 lb

## 2017-05-05 DIAGNOSIS — M25552 Pain in left hip: Secondary | ICD-10-CM

## 2017-05-05 DIAGNOSIS — M5442 Lumbago with sciatica, left side: Secondary | ICD-10-CM

## 2017-05-05 DIAGNOSIS — Z0271 Encounter for disability determination: Secondary | ICD-10-CM

## 2017-05-05 DIAGNOSIS — M79671 Pain in right foot: Secondary | ICD-10-CM

## 2017-05-05 DIAGNOSIS — M5441 Lumbago with sciatica, right side: Secondary | ICD-10-CM

## 2017-05-05 DIAGNOSIS — M79672 Pain in left foot: Secondary | ICD-10-CM

## 2017-05-05 MED ORDER — MELOXICAM 15 MG PO TABS: 15.0000 mg | ORAL_TABLET | Freq: Every day | ORAL | 1 refills | Status: DC

## 2017-05-05 MED ORDER — AMLODIPINE BESYLATE 5 MG PO TABS: 5.0000 mg | ORAL_TABLET | Freq: Every day | ORAL | 3 refills | Status: DC

## 2017-05-05 MED ORDER — VALSARTAN-HYDROCHLOROTHIAZIDE 320-12.5 MG PO TABS: 1.0000 | ORAL_TABLET | Freq: Every day | ORAL | 3 refills | Status: DC

## 2017-05-05 MED FILL — ?MELOXICAM 15MG TABLET: 15 | 30 days supply | Qty: 30 | Fill #0

## 2017-05-05 NOTE — Patient Instructions (Signed)
Go down to [facility] radiology department to obtain hip and lower lumbar spine x-ray. I will notify you of any abnormalities of these images.  I have refilled her medications as requested.  I have also placed a referral for physical therapy for you to obtain a functional capacity test to determine the underlying cause of your disability.

## 2017-05-05 NOTE — Progress Notes (Signed)
Patient ID: Deanna Floyd, female    DOB: [birth year], 56 y.o.   MRN: [id number]  PCP: Scot Jun, FNP  Chief Complaint  Patient presents with  . Acute Visit    Left side pain - above hip bone area    Subjective:  HPI Deanna Floyd is a 56 y.o. female presents for evaluation of chronic left hip pain and low back pain. Medical history includes chronic foot pain, hypertension,chronic pain syndrome.  Evaluation of disability paperwork Shabre brings with her disability paperwork from social security that she requests completion of stemming from a left  foot surgery for repair of bunion and hammertoe in 2016 in [place]. Kayln has previously been evaluated by neurology and podiatry. Neurology performed nerve conduction tests and which were unremarkable. Podiatry, Dr. Amalia Hailey, most recently evaluated Deanna Floyd and during visit she requested completion of disability paperwork. Podiatry exam was unremarkable. She reports today, that she is now experiencing left hip pain and left lumbosacral pain. Denies injury.  Reports a history of arthritis.  Hypertension  Margareth Didio reports no home monitoring of blood pressure. Reports adherence to blood pressure medications. Reports efforts to adhere to low sodium diet. She is a nonsmoker, although a former smoker. Denies any episodes of dizziness, headaches, shortness of breath, or chest pain. Requests 90 day refills on all medications.  Social History   Social History  . Marital status: Single    Spouse name: N/A  . Number of children: N/A  . Years of education: N/A   Occupational History  . Not on file.   Social History Main Topics  . Smoking status: Former Research scientist (life sciences)  . Smokeless tobacco: Never Used  . Alcohol use Yes     Comment: occ  . Drug use: No  . Sexual activity: No   Other Topics Concern  . Not on file   Social History Narrative  . No narrative on file    Family History  Problem Relation Age of Onset  .  Hypertension Sister    Review of Systems See HPI  Patient Active Problem List   Diagnosis Date Noted  . Carpal tunnel syndrome on both sides 11/19/2016  . Metabolic syndrome 78/29/5621  . Essential hypertension 11/19/2016  . Allergic dermatitis 11/19/2016  . Allergy status to unspecified drugs, medicaments and biological substances status 11/19/2016  . BP check 09/22/2016  . Post-operative state 06/02/2016  . Obesity 01/08/2016  . Low back pain 10/06/2015  . Pain in joint, ankle and foot 10/06/2015    Allergies  Allergen Reactions  . Hydrocortisone Hives  . Lyrica [Pregabalin] Rash    Prior to Admission medications   Medication Sig Start Date End Date Taking? Authorizing Provider  amLODipine (NORVASC) 5 MG tablet Take 1 tablet (5 mg total) by mouth daily. 06/07/16  Yes Micheline Chapman, NP  cetirizine (ZYRTEC) 10 MG tablet Take 1 tablet (10 mg total) by mouth daily. 11/19/16  Yes Dorena Dew, FNP  gabapentin (NEURONTIN) 300 MG capsule Take 1 capsule (300 mg total) by mouth at bedtime. 08/31/16  Yes Stover, Titorya, DPM  meloxicam (MOBIC) 15 MG tablet Take 1 tablet (15 mg total) by mouth daily. 02/14/17  Yes Scot Jun, FNP  Multiple Vitamin (MULTIVITAMIN WITH MINERALS) TABS tablet Take 1 tablet by mouth daily.   Yes [provider]  triamcinolone cream (KENALOG) 0.1 % Apply 1 application topically 2 (two) times daily. 11/19/16  Yes Dorena Dew, FNP  valsartan-hydrochlorothiazide (DIOVAN HCT) 320-12.5 MG tablet Take  1 tablet by mouth daily. 06/07/16  Yes Micheline Chapman, NP    Past Medical, Surgical Family and Social History reviewed and updated.    Objective:   Today's Vitals   05/05/17 1515  BP: 129/87  Pulse: 97  Temp: 98.2 F (36.8 C)  SpO2: 97%  Weight: 212 lb 2 oz (96.2 kg)  Height: 5\' 3"  (1.6 m)    Wt Readings from Last 3 Encounters:  05/05/17 212 lb 2 oz (96.2 kg)  02/14/17 215 lb (97.5 kg)  11/19/16 217 lb (98.4 kg)     Physical Exam  Constitutional: She is oriented to person, place, and time. She appears well-developed and well-nourished.  HENT:  Head: Normocephalic and atraumatic.  Neck: Normal range of motion. Neck supple.  Cardiovascular: Normal rate, regular rhythm, normal heart sounds and intact distal pulses.   Pulmonary/Chest: Effort normal and breath sounds normal.  Musculoskeletal: Normal range of motion.  Neurological: She is alert and oriented to person, place, and time.  Psychiatric: She has a normal mood and affect. Her behavior is normal. Judgment and thought content normal.    Assessment & Plan:  1. Disability examination, advised patient that she will need to undergo a functional capacity exam prior to any completion of her disability paperwork. Explained to patient in detail that per my exam today and evaluation of prior documentation of other specialities, in my medical opinion , she does not qualify as being disabled. - Ambulatory referral to Physical Therapy, specifically evaluate patient for functional capacity.  2. Hip pain, left Dg Hip unilateral w or wo Pelvis 2-3 views   3. Foot pain, bilateral,  - meloxicam (MOBIC) 15 MG tablet; Take 1 tablet (15 mg total) by mouth daily.  Dispense: 30 tablet; Refill: 1,  as needed for pain.  4. Acute midline low back pain with bilateral sciatica - DG Lumbar Spine Complete; Future  RTC: 3 months for follow-up hypertension and chronic joint pain.  [name]. [name], MSN, FNP-C [facility]  [address] C[address] [place],  [address] [phone]

## 2017-05-09 ENCOUNTER — Ambulatory Visit (HOSPITAL_COMMUNITY)
Admission: RE | Admit: 2017-05-09 | Discharge: 2017-05-09 | Disposition: A | Discharge status: Home / Self Care | Payer: No Typology Code available for payment source | Source: Ambulatory Visit | Attending: Family Medicine | Admitting: Family Medicine

## 2017-05-09 DIAGNOSIS — M47816 Spondylosis without myelopathy or radiculopathy, lumbar region: Secondary | ICD-10-CM | POA: Insufficient documentation

## 2017-05-09 DIAGNOSIS — M5441 Lumbago with sciatica, right side: Secondary | ICD-10-CM | POA: Insufficient documentation

## 2017-05-09 DIAGNOSIS — M5442 Lumbago with sciatica, left side: Secondary | ICD-10-CM | POA: Insufficient documentation

## 2017-05-09 DIAGNOSIS — M25552 Pain in left hip: Secondary | ICD-10-CM | POA: Insufficient documentation

## 2017-05-09 DIAGNOSIS — M4316 Spondylolisthesis, lumbar region: Secondary | ICD-10-CM | POA: Insufficient documentation

## 2017-05-09 DIAGNOSIS — Z0271 Encounter for disability determination: Secondary | ICD-10-CM | POA: Insufficient documentation

## 2017-05-10 ENCOUNTER — Other Ambulatory Visit: Payer: Self-pay | Admitting: Family Medicine

## 2017-05-10 DIAGNOSIS — M25552 Pain in left hip: Principal | ICD-10-CM

## 2017-05-10 DIAGNOSIS — M5416 Radiculopathy, lumbar region: Secondary | ICD-10-CM

## 2017-05-10 DIAGNOSIS — M25551 Pain in right hip: Secondary | ICD-10-CM

## 2017-05-10 NOTE — Progress Notes (Signed)
Referral to orthopedic surgery for further evaluation of abnormal hip and lumbar spine images

## 2017-05-30 MED FILL — VALSARTAN-HCTZ 320-12.5 MG: 320-12.5 | 30 days supply | Qty: 30 | Fill #8

## 2017-05-30 MED FILL — AMLODIPINE BESYLATE 5 MG TA: 5 | 30 days supply | Qty: 30 | Fill #5

## 2017-05-30 MED FILL — ?PANTOPRAZOLE SOD DR 40MG: 40 MG | 30 days supply | Qty: 30 | Fill #8

## 2017-06-06 ENCOUNTER — Ambulatory Visit (INDEPENDENT_AMBULATORY_CARE_PROVIDER_SITE_OTHER): Payer: Self-pay | Admitting: Orthopaedic Surgery

## 2017-06-06 ENCOUNTER — Encounter (INDEPENDENT_AMBULATORY_CARE_PROVIDER_SITE_OTHER): Payer: Self-pay | Admitting: Orthopaedic Surgery

## 2017-06-06 DIAGNOSIS — M545 Low back pain: Secondary | ICD-10-CM

## 2017-06-06 DIAGNOSIS — G8929 Other chronic pain: Secondary | ICD-10-CM

## 2017-06-06 NOTE — Progress Notes (Addendum)
Office Visit Note   Patient: Deanna Floyd           Date of Birth: 12-03-1960           MRN: [id number] Visit Date: 06/06/2017              Requested by: Scot Jun, [place], [place] [address] PCP: Scot Jun, FNP   Assessment & Plan: Visit Diagnoses:  1. Chronic bilateral low back pain, with sciatica presence unspecified     Plan: Patient has severe degenerative disc disease at L5-S1 and lumbar spondylosis.  Patient has had this issue and has seen her family care doctor has failed conservative treatment. At this point recommend MRI of her lumbar spine to rule out structural abnormalities. Follow-up after the MRI. Total face to face encounter time was greater than 45 minutes and over half of this time was spent in counseling and/or coordination of care.  Follow-Up Instructions: Return in about 2 weeks (around 06/20/2017).   Orders:  Orders Placed This Encounter  Procedures  . MR Lumbar Spine w/o contrast   No orders of the defined types were placed in this encounter.     Procedures: No procedures performed   Clinical Data: No additional findings.   Subjective: Chief Complaint  Patient presents with  . Lower Back - Pain  . Left Hip - Pain    Patient is a 56 year old female who comes in with low back pain and bilateral hip pain. She states the cold weather makes it worse. She denies any groin pain. Pain radiates up and down the legs. Ibuprofen helps some. She denies any lateral hip pain.    Review of Systems  Constitutional: Negative.   HENT: Negative.   Eyes: Negative.   Respiratory: Negative.   Cardiovascular: Negative.   Endocrine: Negative.   Musculoskeletal: Negative.   Neurological: Negative.   Hematological: Negative.   Psychiatric/Behavioral: Negative.   All other systems reviewed and are negative.    Objective: Vital Signs: There were no vitals taken for this visit.  Physical Exam  Constitutional: She is  oriented to person, place, and time. She appears well-developed and well-nourished.  HENT:  Head: Normocephalic and atraumatic.  Eyes: EOM are normal.  Neck: Neck supple.  Pulmonary/Chest: Effort normal.  Abdominal: Soft.  Neurological: She is alert and oriented to person, place, and time.  Skin: Skin is warm. Capillary refill takes less than 2 seconds.  Psychiatric: She has a normal mood and affect. Her behavior is normal. Judgment and thought content normal.  Nursing note and vitals reviewed.   Ortho Exam Bilateral hip exam is relatively benign.  Negative sciatic tension signs. No focal motor or sensory deficits. Specialty Comments:  No specialty comments available.  Imaging: No results found.   PMFS History: Patient Active Problem List   Diagnosis Date Noted  . Carpal tunnel syndrome on both sides 11/19/2016  . Metabolic syndrome 44/11/270  . Essential hypertension 11/19/2016  . Allergic dermatitis 11/19/2016  . Allergy status to unspecified drugs, medicaments and biological substances status 11/19/2016  . BP check 09/22/2016  . Post-operative state 06/02/2016  . Obesity 01/08/2016  . Low back pain 10/06/2015  . Pain in joint, ankle and foot 10/06/2015   Past Medical History:  Diagnosis Date  . Hypertension     Family History  Problem Relation Age of Onset  . Hypertension Sister     Past Surgical History:  Procedure Laterality Date  . FOOT SURGERY  2015   left side had 2 surgeries in 2015  . TRIGGER FINGER RELEASE  2010   Social History   Occupational History  . Not on file.   Social History Main Topics  . Smoking status: Former Research scientist (life sciences)  . Smokeless tobacco: Never Used  . Alcohol use Yes     Comment: occ  . Drug use: No  . Sexual activity: No

## 2017-06-14 ENCOUNTER — Encounter: Payer: Self-pay | Admitting: Family Medicine

## 2017-06-14 ENCOUNTER — Ambulatory Visit (INDEPENDENT_AMBULATORY_CARE_PROVIDER_SITE_OTHER): Payer: No Typology Code available for payment source | Admitting: Family Medicine

## 2017-06-14 VITALS — BP 114/78 | HR 91 | Temp 97.7°F | Resp 14 | Ht 63.0 in | Wt 211.0 lb

## 2017-06-14 DIAGNOSIS — M5442 Lumbago with sciatica, left side: Secondary | ICD-10-CM

## 2017-06-14 DIAGNOSIS — M5441 Lumbago with sciatica, right side: Secondary | ICD-10-CM

## 2017-06-14 DIAGNOSIS — M79671 Pain in right foot: Secondary | ICD-10-CM

## 2017-06-14 DIAGNOSIS — M79672 Pain in left foot: Secondary | ICD-10-CM

## 2017-06-14 DIAGNOSIS — G8929 Other chronic pain: Secondary | ICD-10-CM

## 2017-06-14 DIAGNOSIS — L299 Pruritus, unspecified: Secondary | ICD-10-CM

## 2017-06-14 MED ORDER — CYCLOBENZAPRINE HCL 10 MG PO TABS: 10.0000 mg | ORAL_TABLET | Freq: Three times a day (TID) | ORAL | 1 refills | Status: DC | PRN

## 2017-06-14 MED ORDER — MELOXICAM 15 MG PO TABS: 15.0000 mg | ORAL_TABLET | Freq: Every day | ORAL | 1 refills | Status: DC

## 2017-06-14 MED ORDER — DIPHENHYDRAMINE HCL 2 % EX CREA: TOPICAL_CREAM | Freq: Three times a day (TID) | CUTANEOUS | 0 refills | Status: DC | PRN

## 2017-06-14 MED FILL — ?CYCLOBENZAPRINE 10 MG TABL: 10 | 10 days supply | Qty: 30 | Fill #0

## 2017-06-14 MED FILL — ?MELOXICAM 15 MG TABLET: 15 MG | 30 days supply | Qty: 30 | Fill #0

## 2017-06-14 NOTE — Progress Notes (Signed)
Patient ID: Deanna Floyd, female    DOB: September 16, 1961, 56 y.o.   MRN: [id number]  PCP: Scot Jun, FNP  Chief Complaint  Patient presents with  . Follow-up    foot and back pain    Subjective:  HPI Deanna Floyd is a 56 y.o. female presents for follow-up of back and foot pain. Medical problems include hypertension, prediabetes, chronic left foot pain, chronic low back pain secondary to degenerative disc disease. Deanna Floyd is currently under the care of Dr. Daylene Katayama for chronic left foot pain and was recently referred to Dr. Erlinda Hong at [facility] orthopedics for further evaluation of chronic bilateral low back pain. Recent imaging of the lumbar spine was significant for degenerative disc disease. Deanna Floyd is also attempting to receive disability benefits and has been referred to physical therapy for functional assessment evaluation. Deanna Floyd has an MRI of lumbar spine spending and has a follow-up scheduled with Dr. Erlinda Hong to discuss plan of treatment on 06/27/2017 for chronic back pain. She presents today requesting refills of Meloxicam for chronic joint pain, back pain, and bilateral foot pain. She also reports chronic itching of skin in the absence of a rash. She has a history of allergic dermatitis and does not take chronic antihistamines.  Social History   Social History  . Marital status: Single    Spouse name: N/A  . Number of children: N/A  . Years of education: N/A   Occupational History  . Not on file.   Social History Main Topics  . Smoking status: Former Research scientist (life sciences)  . Smokeless tobacco: Never Used  . Alcohol use Yes     Comment: occ  . Drug use: No  . Sexual activity: No   Other Topics Concern  . Not on file   Social History Narrative  . No narrative on file    Family History  Problem Relation Age of Onset  . Hypertension Sister    Review of Systems See HPI   Patient Active Problem List   Diagnosis Date Noted  . Carpal tunnel syndrome on both sides 11/19/2016   . Metabolic syndrome 23/53/6144  . Essential hypertension 11/19/2016  . Allergic dermatitis 11/19/2016  . Allergy status to unspecified drugs, medicaments and biological substances status 11/19/2016  . BP check 09/22/2016  . Post-operative state 06/02/2016  . Obesity 01/08/2016  . Low back pain 10/06/2015  . Pain in joint, ankle and foot 10/06/2015    Allergies  Allergen Reactions  . Hydrocortisone Hives  . Lyrica [Pregabalin] Rash    Prior to Admission medications   Medication Sig Start Date End Date Taking? Authorizing Provider  amLODipine (NORVASC) 5 MG tablet Take 1 tablet (5 mg total) by mouth daily. 05/05/17  Yes Scot Jun, FNP  cetirizine (ZYRTEC) 10 MG tablet Take 1 tablet (10 mg total) by mouth daily. 11/19/16  Yes Dorena Dew, FNP  gabapentin (NEURONTIN) 300 MG capsule Take 1 capsule (300 mg total) by mouth at bedtime. 08/31/16  Yes Stover, Titorya, DPM  meloxicam (MOBIC) 15 MG tablet Take 1 tablet (15 mg total) by mouth daily. 05/05/17  Yes Scot Jun, FNP  Multiple Vitamin (MULTIVITAMIN WITH MINERALS) TABS tablet Take 1 tablet by mouth daily.   Yes [provider]  triamcinolone cream (KENALOG) 0.1 % Apply 1 application topically 2 (two) times daily. 11/19/16  Yes Dorena Dew, FNP  valsartan-hydrochlorothiazide (DIOVAN HCT) 320-12.5 MG tablet Take 1 tablet by mouth daily. 05/05/17  Yes Scot Jun, FNP  Past Medical, Surgical Family and Social History reviewed and updated.    Objective:   Today's Vitals   06/14/17 1534  BP: 114/78  Pulse: 91  Resp: 14  Temp: 97.7 F (36.5 C)  TempSrc: Oral  SpO2: 98%  Weight: 211 lb (95.7 kg)  Height: 5\' 3"  (1.6 m)    Wt Readings from Last 3 Encounters:  06/14/17 211 lb (95.7 kg)  05/05/17 212 lb 2 oz (96.2 kg)  02/14/17 215 lb (97.5 kg)    Physical Exam  Constitutional: She is oriented to person, place, and time. She appears well-developed and well-nourished.  HENT:  Head:  Normocephalic and atraumatic.  Eyes: Pupils are equal, round, and reactive to light. Conjunctivae and EOM are normal.  Neck: Normal range of motion. Neck supple.  Cardiovascular: Normal rate, regular rhythm, normal heart sounds and intact distal pulses.   Pulmonary/Chest: Effort normal and breath sounds normal.  Abdominal: Soft. Bowel sounds are normal.  Musculoskeletal: Normal range of motion.  Neurological: She is alert and oriented to person, place, and time.  Skin: Skin is warm and dry.  Psychiatric: She has a normal mood and affect. Her behavior is normal. Judgment and thought content normal.      Assessment & Plan:  1. Foot pain, bilateral, chronic  - Meloxicam (MOBIC) 15 MG tablet; Take 1 tablet (15 mg total) by mouth daily.  Dispense: 30 tablet; Refill: 1 -Continue follow-up with Dr. Amalia Hailey, podiatry.  2. Chronic bilateral low back pain with bilateral sciatica -Cyclobenzaprine 10 mg up to 3 times daily. Medication causes drowsiness avoid driving or operating heavy machinery. -Continue follow-up with Dr. Erlinda Hong, [facility] orthopedics.  3. Itchy skin -You may take cetirizine or benadryl as needed for itching. -To prevent itching keep skin moisturized and hydrated with skin cream for skin lotion.   RTC: 2 months for chronic condition follow-up, evaluation of functional assessment, and disability report  [name]. [name], MSN, FNP-C [facility]  [address] [place], [place] [address] [phone]

## 2017-06-14 NOTE — Patient Instructions (Addendum)
For itching I would recommend cetrizine and benadryl.  For back cyclobenzaprine 10 mg   (causes drowsiness, therefore avoid driving).  Take Meloxicam 15 mg once daily for joint pain.   Degenerative Disk Disease Degenerative disk disease is a condition caused by the changes that occur in spinal disks as you grow older. Spinal disks are soft and compressible disks located between the bones of your spine (vertebrae). These disks act like shock absorbers. Degenerative disk disease can affect the whole spine. However, the neck and lower back are most commonly affected. Many changes can occur in the spinal disks with aging, such as:  The spinal disks may dry and shrink.  Small tears may occur in the tough, outer covering of the disk (annulus).  The disk space may become smaller due to loss of water.  Abnormal growths in the bone (spurs) may occur. This can put pressure on the nerve roots exiting the spinal canal, causing pain.  The spinal canal may become narrowed.  What increases the risk?  Being overweight.  Having a family history of degenerative disk disease.  Smoking.  There is increased risk if you are doing heavy lifting or have a sudden injury. What are the signs or symptoms? Symptoms vary from person to person and may include:  Pain that varies in intensity. Some people have no pain, while others have severe pain. The location of the pain depends on the part of your backbone that is affected. ? You will have neck or arm pain if a disk in the neck area is affected. ? You will have pain in your back, buttocks, or legs if a disk in the lower back is affected.  Pain that becomes worse while bending, reaching up, or with twisting movements.  Pain that may start gradually and then get worse as time passes. It may also start after a major or minor injury.  Numbness or tingling in the arms or legs.  How is this diagnosed? Your health care provider will ask you about your  symptoms and about activities or habits that may cause the pain. He or she may also ask about any injuries, diseases, or treatments you have had. Your health care provider will examine you to check for the range of movement that is possible in the affected area, to check for strength in your extremities, and to check for sensation in the areas of the arms and legs supplied by different nerve roots. You may also have:  An X-ray of the spine.  Other imaging tests, such as MRI.  How is this treated? Your health care provider will advise you on the best plan for treatment. Treatment may include:  Medicines.  Rehabilitation exercises.  Follow these instructions at home:  Follow proper lifting and walking techniques as advised by your health care provider.  Maintain good posture.  Exercise regularly as advised by your health care provider.  Perform relaxation exercises.  Change your sitting, standing, and sleeping habits as advised by your health care provider.  Change positions frequently.  Lose weight or maintain a healthy weight as advised by your health care provider.  Do not use any tobacco products, including cigarettes, chewing tobacco, or electronic cigarettes. If you need help quitting, ask your health care provider.  Wear supportive footwear.  Take medicines only as directed by your health care provider. Contact a health care provider if:  Your pain does not go away within 1-4 weeks.  You have significant appetite or weight loss. Get help  right away if:  Your pain is severe.  You notice weakness in your arms, hands, or legs.  You begin to lose control of your bladder or bowel movements.  You have fevers or night sweats. This information is not intended to replace advice given to you by your health care provider. Make sure you discuss any questions you have with your health care provider. Document Released: 08/22/2007 Document Revised: 04/01/2016 Document Reviewed:  02/26/2014 Elsevier Interactive Patient Education  2018 [employer].    Chronic Back Pain When back pain lasts longer than 3 months, it is called chronic back pain.The cause of your back pain may not be known. Some common causes include:  Wear and tear (degenerative disease) of the bones, ligaments, or disks in your back.  Inflammation and stiffness in your back (arthritis).  People who have chronic back pain often go through certain periods in which the pain is more intense (flare-ups). Many people can learn to manage the pain with home care. Follow these instructions at home: Pay attention to any changes in your symptoms. Take these actions to help with your pain: Activity  Avoid bending and activities that make the problem worse.  Do not sit or stand in one place for long periods of time.  Take brief periods of rest throughout the day. This will reduce your pain. Resting in a lying or standing position is usually better than sitting to rest.  When you are resting for longer periods, mix in some mild activity or stretching between periods of rest. This will help to prevent stiffness and pain.  Get regular exercise. Ask your health care provider what activities are safe for you.  Do not lift anything that is heavier than 10 lb (4.5 kg). Always use proper lifting technique, which includes: ? Bending your knees. ? Keeping the load close to your body. ? Avoiding twisting. Managing pain  If directed, apply ice to the painful area. Your health care provider may recommend applying ice during the first 24-48 hours after a flare-up begins. ? Put ice in a plastic bag. ? Place a towel between your skin and the bag. ? Leave the ice on for 20 minutes, 2-3 times per day.  After icing, apply heat to the affected area as often as told by your health care provider. Use the heat source that your health care provider recommends, such as a moist heat pack or a heating pad. ? Place a towel  between your skin and the heat source. ? Leave the heat on for 20-30 minutes. ? Remove the heat if your skin turns bright red. This is especially important if you are unable to feel pain, heat, or cold. You may have a greater risk of getting burned.  Try soaking in a warm tub.  Take over-the-counter and prescription medicines only as told by your health care provider.  Keep all follow-up visits as told by your health care provider. This is important. Contact a health care provider if:  You have pain that is not relieved with rest or medicine. Get help right away if:  You have weakness or numbness in one or both of your legs or feet.  You have trouble controlling your bladder or your bowels.  You have nausea or vomiting.  You have pain in your abdomen.  You have shortness of breath or you faint. This information is not intended to replace advice given to you by your health care provider. Make sure you discuss any questions you have  with your health care provider. Document Released: 12/02/2004 Document Revised: 03/04/2016 Document Reviewed: 04/14/2015 Elsevier Interactive Patient Education  2018 [employer].

## 2017-06-20 ENCOUNTER — Ambulatory Visit
Admission: RE | Admit: 2017-06-20 | Discharge: 2017-06-20 | Disposition: A | Discharge status: Home / Self Care | Payer: No Typology Code available for payment source | Source: Ambulatory Visit | Attending: Orthopaedic Surgery | Admitting: Orthopaedic Surgery

## 2017-06-20 ENCOUNTER — Other Ambulatory Visit: Payer: No Typology Code available for payment source

## 2017-06-20 DIAGNOSIS — M545 Low back pain: Principal | ICD-10-CM

## 2017-06-20 DIAGNOSIS — G8929 Other chronic pain: Secondary | ICD-10-CM

## 2017-06-27 ENCOUNTER — Other Ambulatory Visit: Payer: Self-pay

## 2017-06-27 ENCOUNTER — Ambulatory Visit (INDEPENDENT_AMBULATORY_CARE_PROVIDER_SITE_OTHER): Payer: Self-pay | Admitting: Orthopaedic Surgery

## 2017-06-27 DIAGNOSIS — M47816 Spondylosis without myelopathy or radiculopathy, lumbar region: Secondary | ICD-10-CM

## 2017-06-27 MED ORDER — VALSARTAN-HYDROCHLOROTHIAZIDE 320-12.5 MG PO TABS: 1.0000 | ORAL_TABLET | Freq: Every day | ORAL | 3 refills | Status: DC

## 2017-06-27 MED ORDER — AMLODIPINE BESYLATE 5 MG PO TABS: 5.0000 mg | ORAL_TABLET | Freq: Every day | ORAL | 3 refills | Status: DC

## 2017-06-27 MED FILL — AMLODIPINE BESYLATE 5 MG TA: 5 | 30 days supply | Qty: 30 | Fill #0

## 2017-06-27 MED FILL — VALSARTAN-HCTZ 320-12.5 MG: 320-12.5 | 30 days supply | Qty: 30 | Fill #0

## 2017-06-27 NOTE — Progress Notes (Signed)
   Office Visit Note   Patient: Deanna Floyd           Date of Birth: [birth year]           MRN: [id number] Visit Date: 06/27/2017              Requested by: Scot Jun, [place], [place] [address] PCP: Scot Jun, FNP   Assessment & Plan: Visit Diagnoses:  1. Spondylosis of lumbar region without myelopathy or radiculopathy     Plan: MRI shows severe facet disease at L4-5 and L5-S1. She has some slight central canal stenosis without any significant neural foraminal stenosis. We discussed referral to Dr. Ernestina Patches for facet blocks. She will think about this and let us know. In the meantime recommend weight loss, physical therapy, over-the-counter NSAIDs. Follow-up as needed.  Follow-Up Instructions: Return if symptoms worsen or fail to improve.   Orders:  No orders of the defined types were placed in this encounter.  No orders of the defined types were placed in this encounter.     Procedures: No procedures performed   Clinical Data: No additional findings.   Subjective: Chief Complaint  Patient presents with  . Lower Back - Pain, Follow-up    Patient follows up today for her lumbar spine MRI. She continues to back pain without true radiculopathy.    Review of Systems   Objective: Vital Signs: There were no vitals taken for this visit.  Physical Exam  Ortho Exam Exam is stable Specialty Comments:  No specialty comments available.  Imaging: No results found.   PMFS History: Patient Active Problem List   Diagnosis Date Noted  . Carpal tunnel syndrome on both sides 11/19/2016  . Metabolic syndrome 50/93/2671  . Essential hypertension 11/19/2016  . Allergic dermatitis 11/19/2016  . Allergy status to unspecified drugs, medicaments and biological substances status 11/19/2016  . BP check 09/22/2016  . Post-operative state 06/02/2016  . Obesity 01/08/2016  . Low back pain 10/06/2015  . Pain in joint, ankle and foot  10/06/2015   Past Medical History:  Diagnosis Date  . Hypertension     Family History  Problem Relation Age of Onset  . Hypertension Sister     Past Surgical History:  Procedure Laterality Date  . FOOT SURGERY  2015   left side had 2 surgeries in 2015  . TRIGGER FINGER RELEASE  2010   Social History   Occupational History  . Not on file.   Social History Main Topics  . Smoking status: Former Research scientist (life sciences)  . Smokeless tobacco: Never Used  . Alcohol use Yes     Comment: occ  . Drug use: No  . Sexual activity: No

## 2017-07-04 ENCOUNTER — Encounter: Payer: Self-pay | Admitting: Family Medicine

## 2017-07-04 ENCOUNTER — Ambulatory Visit (INDEPENDENT_AMBULATORY_CARE_PROVIDER_SITE_OTHER): Payer: Self-pay | Admitting: Family Medicine

## 2017-07-04 VITALS — BP 130/88 | HR 90 | Temp 97.8°F | Resp 14 | Ht 63.0 in | Wt 211.8 lb

## 2017-07-04 DIAGNOSIS — G8929 Other chronic pain: Secondary | ICD-10-CM

## 2017-07-04 DIAGNOSIS — M545 Low back pain: Secondary | ICD-10-CM

## 2017-07-04 DIAGNOSIS — Z23 Encounter for immunization: Secondary | ICD-10-CM

## 2017-07-04 MED ORDER — ACETAMINOPHEN-CODEINE 300-30 MG PO TABS: 1.0000 | ORAL_TABLET | Freq: Every day | ORAL | 0 refills | Status: DC

## 2017-07-04 MED ORDER — MELOXICAM 15 MG PO TABS: 15.0000 mg | ORAL_TABLET | Freq: Every day | ORAL | 1 refills | Status: DC

## 2017-07-04 NOTE — Progress Notes (Signed)
Patient ID: Deanna Floyd, female    DOB: [birth year], 56 y.o.   MRN: [id number]  PCP: Scot Jun, FNP  Chief Complaint  Patient presents with  . Follow-up    Back pain    Subjective:  HPI Deanna Floyd is a 56 y.o. female with chronic pain presents to follow-up on back pain and request completion of disability documentation. Deanna Floyd has been referred to Dr. Erlinda Hong at [facility] for evaluation of chronic bilateral low back pain. Recent MRI was significant for severe fact disease of L4-5 and L5-S1 with a treatment recommendation of nerve block injections. Deanna Floyd reports that she only recently decided to proceed with obtaining nerve blocks. She is taking Meloxicam for pain which provides her some relief. She never attempted relief with cyclobenzaprine as medication has caused her to experience gas in the past. She cannot take gabapentin due to nausea.  Social History   Social History  . Marital status: Single    Spouse name: N/A  . Number of children: N/A  . Years of education: N/A   Occupational History  . Not on file.   Social History Main Topics  . Smoking status: Former Research scientist (life sciences)  . Smokeless tobacco: Never Used  . Alcohol use Yes     Comment: occ  . Drug use: No  . Sexual activity: No   Other Topics Concern  . Not on file   Social History Narrative  . No narrative on file    Family History  Problem Relation Age of Onset  . Hypertension Sister    Review of Systems See HPI  Patient Active Problem List   Diagnosis Date Noted  . Carpal tunnel syndrome on both sides 11/19/2016  . Metabolic syndrome 70/62/3762  . Essential hypertension 11/19/2016  . Allergic dermatitis 11/19/2016  . Allergy status to unspecified drugs, medicaments and biological substances status 11/19/2016  . BP check 09/22/2016  . Post-operative state 06/02/2016  . Obesity 01/08/2016  . Low back pain 10/06/2015  . Pain in joint, ankle and foot 10/06/2015    Allergies   Allergen Reactions  . Hydrocortisone Hives  . Lyrica [Pregabalin] Rash    Prior to Admission medications   Medication Sig Start Date End Date Taking? Authorizing Provider  amLODipine (NORVASC) 5 MG tablet Take 1 tablet (5 mg total) by mouth daily. 06/27/17  Yes Scot Jun, FNP  cetirizine (ZYRTEC) 10 MG tablet Take 1 tablet (10 mg total) by mouth daily. 11/19/16  Yes Dorena Dew, FNP  cyclobenzaprine (FLEXERIL) 10 MG tablet Take 1 tablet (10 mg total) by mouth 3 (three) times daily as needed for muscle spasms. 06/14/17  Yes Scot Jun, FNP  diphenhydrAMINE (BENADRYL) 2 % cream Apply topically 3 (three) times daily as needed for itching. 06/14/17  Yes Scot Jun, FNP  gabapentin (NEURONTIN) 300 MG capsule Take 1 capsule (300 mg total) by mouth at bedtime. 08/31/16  Yes Stover, Titorya, DPM  meloxicam (MOBIC) 15 MG tablet Take 1 tablet (15 mg total) by mouth daily. 06/14/17  Yes Scot Jun, FNP  Multiple Vitamin (MULTIVITAMIN WITH MINERALS) TABS tablet Take 1 tablet by mouth daily.   Yes [provider]  valsartan-hydrochlorothiazide (DIOVAN HCT) 320-12.5 MG tablet Take 1 tablet by mouth daily. 06/27/17  Yes Scot Jun, FNP    Past Medical, Surgical Family and Social History reviewed and updated.    Objective:   Today's Vitals   07/04/17 1345  BP: 130/88  Pulse: 90  Resp: 14  Temp: 97.8 F (36.6 C)  TempSrc: Oral  SpO2: 98%  Weight: 211 lb 12.8 oz (96.1 kg)  Height: 5\' 3"  (1.6 m)    Wt Readings from Last 3 Encounters:  07/04/17 211 lb 12.8 oz (96.1 kg)  06/14/17 211 lb (95.7 kg)  05/05/17 212 lb 2 oz (96.2 kg)   Physical Exam  Constitutional: She is oriented to person, place, and time. She appears well-developed and well-nourished.  HENT:  Head: Normocephalic and atraumatic.  Eyes: Pupils are equal, round, and reactive to light. Conjunctivae and EOM are normal.  Neck: Normal range of motion. Neck supple.  Cardiovascular:  Normal rate, regular rhythm, normal heart sounds and intact distal pulses.   Pulmonary/Chest: Effort normal and breath sounds normal.  Musculoskeletal: She exhibits tenderness. She exhibits no edema or deformity.  Neurological: She is alert and oriented to person, place, and time.  Skin: Skin is warm and dry.  Psychiatric: She has a normal mood and affect. Her behavior is normal. Judgment and thought content normal.   Assessment & Plan:  1. Chronic bilateral low back pain without sciatica - Meloxicam (MOBIC) 15 MG tablet; Take 1 tablet (15 mg total) by mouth daily.  Dispense: 30 tablet; Refill: 1 -Disability paperwork left here at office for completion.  2. Need for influenza vaccination - Flu Vaccine QUAD 36+ mos IM  RTC: 6 weeks for follow-up   [name]. [name], MSN, FNP-C [facility]  [address] [place], [place] [address] [phone]

## 2017-07-12 ENCOUNTER — Telehealth: Payer: Self-pay | Admitting: Family Medicine

## 2017-07-12 ENCOUNTER — Other Ambulatory Visit: Payer: Self-pay | Admitting: Family Medicine

## 2017-07-12 DIAGNOSIS — Z7689 Persons encountering health services in other specified circumstances: Secondary | ICD-10-CM

## 2017-07-12 NOTE — Progress Notes (Signed)
Received disability application documentation that requires an extensive psychiatric evaluation. Patient has never been evaluated in office for psychiatric condition or complained of psychiatric problems. I am referring her to [facility] for a complete mental health work-up and evaluation.

## 2017-07-12 NOTE — Telephone Encounter (Signed)
Reviewed disability paperwork patient has dropped off to be completed.   The initial portion of paperwork was addressed to her representative and requires that she sign a medical release form releasing records from ALL prior providers to her representative listed in order for either she or disability rep or attorney to complete. This information Does Not require a provider to complete.   Second portion of the documentation is a psychiatric evaluation which I have placed a referral to behavioral health in order to have her evaluated and for psy to complete evaluation. Advise her to come by and pick-up paperwork from the front desk.  [name]. [name], MSN, FNP-C [facility]  [address] [place], [place] [address] [phone]

## 2017-07-13 ENCOUNTER — Telehealth (INDEPENDENT_AMBULATORY_CARE_PROVIDER_SITE_OTHER): Payer: Self-pay | Admitting: Orthopaedic Surgery

## 2017-07-13 DIAGNOSIS — M545 Low back pain: Principal | ICD-10-CM

## 2017-07-13 DIAGNOSIS — G8929 Other chronic pain: Secondary | ICD-10-CM

## 2017-07-13 NOTE — Telephone Encounter (Signed)
Patient came in to let Dr. Erlinda Hong know that she is wanting the injections in her back.  CB#[phone]

## 2017-07-13 NOTE — Telephone Encounter (Signed)
Patient notified and will pick up paperwork tomorrow

## 2017-07-14 NOTE — Telephone Encounter (Signed)
Yes with newton

## 2017-07-14 NOTE — Telephone Encounter (Signed)
Please advise 

## 2017-07-14 NOTE — Telephone Encounter (Signed)
ORDER MADE SOMEONE WILL CALL HER TO SCHEDULE APPT

## 2017-07-15 NOTE — Telephone Encounter (Signed)
Patient said that she didn't understand what was going on and that she would be by on Monday 07/15/17 to sign whatever she needs to sign.

## 2017-07-20 MED FILL — ACETAMINOPHEN/COD #3 TABLET: 300-30 | 30 days supply | Qty: 30 | Fill #0

## 2017-07-21 MED FILL — AMLODIPINE BESYLATE 5 MG TA: 5 | 30 days supply | Qty: 30 | Fill #1

## 2017-07-21 MED FILL — VALSARTAN-HCTZ 320-12.5 MG: 320-12.5 | 30 days supply | Qty: 30 | Fill #1

## 2017-07-28 ENCOUNTER — Ambulatory Visit (INDEPENDENT_AMBULATORY_CARE_PROVIDER_SITE_OTHER): Payer: Self-pay | Admitting: Physical Medicine and Rehabilitation

## 2017-07-28 ENCOUNTER — Encounter (INDEPENDENT_AMBULATORY_CARE_PROVIDER_SITE_OTHER): Payer: Self-pay | Admitting: Physical Medicine and Rehabilitation

## 2017-07-28 ENCOUNTER — Ambulatory Visit (INDEPENDENT_AMBULATORY_CARE_PROVIDER_SITE_OTHER): Payer: Self-pay

## 2017-07-28 VITALS — BP 140/85 | HR 102

## 2017-07-28 DIAGNOSIS — M545 Low back pain: Secondary | ICD-10-CM

## 2017-07-28 DIAGNOSIS — G8929 Other chronic pain: Secondary | ICD-10-CM

## 2017-07-28 DIAGNOSIS — M47816 Spondylosis without myelopathy or radiculopathy, lumbar region: Secondary | ICD-10-CM

## 2017-07-28 MED ORDER — LIDOCAINE HCL (PF) 1 % IJ SOLN
2.0000 mL | Freq: Once | INTRAMUSCULAR | Status: AC
  Administered 2017-07-28: 2 mL

## 2017-07-28 MED ORDER — METHYLPREDNISOLONE ACETATE 80 MG/ML IJ SUSP
80.0000 mg | Freq: Once | INTRAMUSCULAR | Status: AC
  Administered 2017-07-28: 80 mg

## 2017-07-28 NOTE — Patient Instructions (Signed)

## 2017-07-28 NOTE — Progress Notes (Deleted)
Pain across low back. Worse in the middle. Constant pain and worse at night with laying down. No leg pain. Injection in foot, hives, used a cream or lotion to help. Not other reactions in past with trigger finger. Told to use benadryl.

## 2017-08-07 NOTE — Procedures (Signed)
Deanna Floyd is a 56 year old female with chronic worsening axial low back pain worse in the middle across the lower back. Her symptoms are constant but worse at night laying down and worse with standing. She has no leg pain or hip pain. She has no numbness tingling or paresthesias. MRI evidence of facet arthropathy without stenosis. No prior surgeries. Dr. Erlinda Hong requested diagnostic and therapeutic facet joint block. The L4-5 level is the most problematic on the MRI. She reports some hives with an injection which was performed in the foot. We discussed this at length with her and if she does get any hives with the injection she can use Benadryl which is what she was told to use in the past. She's had no anaphylactic reactions. The only medications in the injection would be Marcaine and Depo-Medrol.The injection  will be diagnostic and hopefully therapeutic. The patient has failed conservative care including time, medications and activity modification.  Lumbar Facet Joint Intra-Articular Injection(s) with Fluoroscopic Guidance  Patient: Deanna Floyd      Date of Birth: 1961-10-13 MRN: [id number] PCP: Scot Jun, FNP      Visit Date: 07/28/2017   Universal Protocol:    Date/Time: 07/28/2017  Consent Given By: the patient  Position: PRONE   Additional Comments: Vital signs were monitored before and after the procedure. Patient was prepped and draped in the usual sterile fashion. The correct patient, procedure, and site was verified.   Injection Procedure Details:  Procedure Site One Meds Administered:  Meds ordered this encounter  Medications  . lidocaine (PF) (XYLOCAINE) 1 % injection 2 mL  . methylPREDNISolone acetate (DEPO-MEDROL) injection 80 mg     Laterality: Bilateral  Location/Site:  L4-L5  Needle size: 22 guage  Needle type: Spinal  Needle Placement: Articular  Findings:  -Contrast Used: 0.5 mL iohexol 180 mg iodine/mL   -Comments: Excellent flow of contrast  producing a partial arthrogram.  Procedure Details: The fluoroscope beam is vertically oriented in AP, and the inferior recess is visualized beneath the lower pole of the inferior apophyseal process, which represents the target point for needle insertion. When direct visualization is difficult the target point is located at the medial projection of the vertebral pedicle. The region overlying each aforementioned target is locally anesthetized with a 1 to 2 ml. volume of 1% Lidocaine without Epinephrine.   The spinal needle was inserted into each of the above mentioned facet joints using biplanar fluoroscopic guidance. A 0.25 to 0.5 ml. volume of Isovue-250 was injected and a partial facet joint arthrogram was obtained. A single spot film was obtained of the resulting arthrogram.    One to 1.25 ml of the steroid/anesthetic solution was then injected into each of the facet joints noted above.   Additional Comments:  The patient tolerated the procedure well Dressing: Band-Aid    Post-procedure details: Patient was observed during the procedure. Post-procedure instructions were reviewed.  Patient left the clinic in stable condition.

## 2017-08-16 ENCOUNTER — Ambulatory Visit (INDEPENDENT_AMBULATORY_CARE_PROVIDER_SITE_OTHER): Payer: Self-pay | Admitting: Family Medicine

## 2017-08-16 ENCOUNTER — Encounter: Payer: Self-pay | Admitting: Family Medicine

## 2017-08-16 ENCOUNTER — Encounter: Payer: Self-pay | Admitting: Gastroenterology

## 2017-08-16 VITALS — BP 126/80 | HR 74 | Temp 97.8°F | Resp 12 | Ht 63.0 in | Wt 212.0 lb

## 2017-08-16 DIAGNOSIS — K2100 Gastro-esophageal reflux disease with esophagitis, without bleeding: Secondary | ICD-10-CM

## 2017-08-16 DIAGNOSIS — Z9189 Other specified personal risk factors, not elsewhere classified: Secondary | ICD-10-CM

## 2017-08-16 DIAGNOSIS — Z1211 Encounter for screening for malignant neoplasm of colon: Secondary | ICD-10-CM

## 2017-08-16 MED ORDER — OMEPRAZOLE 40 MG PO CPDR: 40.0000 mg | DELAYED_RELEASE_CAPSULE | Freq: Every day | ORAL | 3 refills | Status: DC

## 2017-08-16 NOTE — Progress Notes (Signed)
Patient ID: Deanna Floyd, female    DOB: 22-Jul-1961, 56 y.o.   MRN: [id number]  PCP: Scot Jun, FNP  Chief Complaint  Patient presents with  . Follow-up    6 MONTH    Subjective:  HPI Deanna Floyd is a 56 y.o. female presents for evaluation of acid reflux symptoms. She reports a 1 week history of awakening with nausea, burning of throat, dry cough, and loose stools. Reports previous treatment of acid reflux with Prilosec and discontinued therapy once symptoms resolved. Denies associated abdominal pain. This has been present since last week. No abdominal. A little diarrhea.  Occasionally loose and watery. Appetite has remained unchanged. Unaware of which foods exacerbate symptoms. [name] is overdue for colonoscopy. She also requests a dental referral today, as she requires dental evaluation of chronic dental pain. Social History   Social History  . Marital status: Single    Spouse name: N/A  . Number of children: N/A  . Years of education: N/A   Occupational History  . Not on file.   Social History Main Topics  . Smoking status: Former Research scientist (life sciences)  . Smokeless tobacco: Never Used  . Alcohol use Yes     Comment: occ  . Drug use: No  . Sexual activity: No   Other Topics Concern  . Not on file   Social History Narrative  . No narrative on file    Family History  Problem Relation Age of Onset  . Hypertension Sister    Review of Systems See HPI  Patient Active Problem List   Diagnosis Date Noted  . Carpal tunnel syndrome on both sides 11/19/2016  . Metabolic syndrome 06/21/4817  . Essential hypertension 11/19/2016  . Allergic dermatitis 11/19/2016  . Allergy status to unspecified drugs, medicaments and biological substances status 11/19/2016  . BP check 09/22/2016  . Post-operative state 06/02/2016  . Obesity 01/08/2016  . Low back pain 10/06/2015  . Pain in joint, ankle and foot 10/06/2015    Allergies  Allergen Reactions  . Hydrocortisone Hives   . Lyrica [Pregabalin] Rash    Prior to Admission medications   Medication Sig Start Date End Date Taking? Authorizing Provider  Acetaminophen-Codeine (TYLENOL/CODEINE #3) 300-30 MG tablet Take 1 tablet by mouth at bedtime. 07/04/17  Yes Scot Jun, FNP  amLODipine (NORVASC) 5 MG tablet Take 1 tablet (5 mg total) by mouth daily. 06/27/17  Yes Scot Jun, FNP  cetirizine (ZYRTEC) 10 MG tablet Take 1 tablet (10 mg total) by mouth daily. 11/19/16  Yes Dorena Dew, FNP  diphenhydrAMINE (BENADRYL) 2 % cream Apply topically 3 (three) times daily as needed for itching. 06/14/17  Yes Scot Jun, FNP  gabapentin (NEURONTIN) 300 MG capsule Take 1 capsule (300 mg total) by mouth at bedtime. 08/31/16  Yes Stover, Titorya, DPM  meloxicam (MOBIC) 15 MG tablet Take 1 tablet (15 mg total) by mouth daily. 07/04/17  Yes Scot Jun, FNP  Multiple Vitamin (MULTIVITAMIN WITH MINERALS) TABS tablet Take 1 tablet by mouth daily.   Yes [provider]  valsartan-hydrochlorothiazide (DIOVAN HCT) 320-12.5 MG tablet Take 1 tablet by mouth daily. 06/27/17  Yes Scot Jun, FNP    Past Medical, Surgical Family and Social History reviewed and updated.    Objective:   Today's Vitals   08/16/17 0844  BP: 126/80  Pulse: 74  Resp: 12  Temp: 97.8 F (36.6 C)  TempSrc: Oral  SpO2: 96%  Weight: 212 lb (96.2 kg)  Height:  5\' 3"  (1.6 m)    Wt Readings from Last 3 Encounters:  08/16/17 212 lb (96.2 kg)  07/04/17 211 lb 12.8 oz (96.1 kg)  06/14/17 211 lb (95.7 kg)   Physical Exam  Constitutional: She is oriented to person, place, and time. She appears well-developed and well-nourished.  HENT:  Head: Normocephalic and atraumatic.  Eyes: Pupils are equal, round, and reactive to light. Conjunctivae and EOM are normal.  Neck: Neck supple. No thyromegaly present.  Cardiovascular: Normal rate, regular rhythm, normal heart sounds and intact distal pulses.   Pulmonary/Chest:  Effort normal and breath sounds normal.  Musculoskeletal: Normal range of motion.  Neurological: She is alert and oriented to person, place, and time.  Skin: Skin is warm and dry.  Psychiatric: She has a normal mood and affect. Her behavior is normal. Judgment and thought content normal.   Assessment & Plan:  1. Gastroesophageal reflux disease with esophagitis, will trial patient on omeprazole 40 mg once daily. Also referred for colonoscopy, per our records, patient has never has this screening completed.  2. Colon cancer screening - Ambulatory referral to Gastroenterology 3. At risk for dental problems - Ambulatory referral to Dentistry   RTC: 6 months for CPE or sooner if needed.    [name]. [name], MSN, FNP-C [facility]  [address] [place], [place] [address] [phone]

## 2017-08-16 NOTE — Patient Instructions (Addendum)
You have been referred for colonoscopy. I am starting you on omeprazole 40 mg daily for acid reflux symptoms. If no improvement of symptoms, please let me know.    Heartburn Heartburn is a type of pain or discomfort that can happen in the throat or chest. It is often described as a burning pain. It may also cause a bad taste in the mouth. Heartburn may feel worse when you lie down or bend over. It may be caused by stomach contents that move back up (reflux) into the tube that connects the mouth with the stomach (esophagus). Follow these instructions at home: Take these actions to lessen your discomfort and to help avoid problems. Diet  Follow a diet as told by your doctor. You may need to avoid foods and drinks such as: ? Coffee and tea (with or without caffeine). ? Drinks that contain alcohol. ? Energy drinks and sports drinks. ? Carbonated drinks or sodas. ? Chocolate and cocoa. ? Peppermint and mint flavorings. ? Garlic and onions. ? Horseradish. ? Spicy and acidic foods, such as peppers, chili powder, curry powder, vinegar, hot sauces, and BBQ sauce. ? Citrus fruit juices and citrus fruits, such as oranges, lemons, and limes. ? Tomato-based foods, such as red sauce, chili, salsa, and pizza with red sauce. ? Fried and fatty foods, such as donuts, french fries, potato chips, and high-fat dressings. ? High-fat meats, such as hot dogs, rib eye steak, sausage, ham, and bacon. ? High-fat dairy items, such as whole milk, butter, and cream cheese.  Eat small meals often. Avoid eating large meals.  Avoid drinking large amounts of liquid with your meals.  Avoid eating meals during the 2-3 hours before bedtime.  Avoid lying down right after you eat.  Do not exercise right after you eat. General instructions  Pay attention to any changes in your symptoms.  Take over-the-counter and prescription medicines only as told by your doctor. Do not take aspirin, ibuprofen, or other NSAIDs  unless your doctor says it is okay.  Do not use any tobacco products, including cigarettes, chewing tobacco, and e-cigarettes. If you need help quitting, ask your doctor.  Wear loose clothes. Do not wear anything tight around your waist.  Raise (elevate) the head of your bed about 6 inches (15 cm).  Try to lower your stress. If you need help doing this, ask your doctor.  If you are overweight, lose an amount of weight that is healthy for you. Ask your doctor about a safe weight loss goal.  Keep all follow-up visits as told by your doctor. This is important. Contact a doctor if:  You have new symptoms.  You lose weight and you do not know why it is happening.  You have trouble swallowing, or it hurts to swallow.  You have wheezing or a cough that keeps happening.  Your symptoms do not get better with treatment.  You have heartburn often for more than two weeks. Get help right away if:  You have pain in your arms, neck, jaw, teeth, or back.  You feel sweaty, dizzy, or light-headed.  You have chest pain or shortness of breath.  You throw up (vomit) and your throw up looks like blood or coffee grounds.  Your poop (stool) is bloody or black. This information is not intended to replace advice given to you by your health care provider. Make sure you discuss any questions you have with your health care provider. Document Released: 07/07/2011 Document Revised: 04/01/2016 Document Reviewed: 02/19/2015 Elsevier  Interactive Patient Education  2018 [employer] for Gastroesophageal Reflux Disease, Adult When you have gastroesophageal reflux disease (GERD), the foods you eat and your eating habits are very important. Choosing the right foods can help ease your discomfort. What guidelines do I need to follow?  Choose fruits, vegetables, whole grains, and low-fat dairy products.  Choose low-fat meat, fish, and poultry.  Limit fats such as oils, salad  dressings, butter, nuts, and avocado.  Keep a food diary. This helps you identify foods that cause symptoms.  Avoid foods that cause symptoms. These may be different for everyone.  Eat small meals often instead of 3 large meals a day.  Eat your meals slowly, in a place where you are relaxed.  Limit fried foods.  Cook foods using methods other than frying.  Avoid drinking alcohol.  Avoid drinking large amounts of liquids with your meals.  Avoid bending over or lying down until 2-3 hours after eating. What foods are not recommended? These are some foods and drinks that may make your symptoms worse: Vegetables Tomatoes. Tomato juice. Tomato and spaghetti sauce. Chili peppers. Onion and garlic. Horseradish. Fruits Oranges, grapefruit, and lemon (fruit and juice). Meats High-fat meats, fish, and poultry. This includes hot dogs, ribs, ham, sausage, salami, and bacon. Dairy Whole milk and chocolate milk. Sour cream. Cream. Butter. Ice cream. Cream cheese. Drinks Coffee and tea. Bubbly (carbonated) drinks or energy drinks. Condiments Hot sauce. Barbecue sauce. Sweets/Desserts Chocolate and cocoa. Donuts. Peppermint and spearmint. Fats and Oils High-fat foods. This includes [place] fries and potato chips. Other Vinegar. Strong spices. This includes black pepper, white pepper, red pepper, cayenne, curry powder, cloves, ginger, and chili powder. The items listed above may not be a complete list of foods and drinks to avoid. Contact your dietitian for more information. This information is not intended to replace advice given to you by your health care provider. Make sure you discuss any questions you have with your health care provider. Document Released: 04/25/2012 Document Revised: 04/01/2016 Document Reviewed: 08/29/2013 Elsevier Interactive Patient Education  2017 [employer].

## 2017-08-26 MED FILL — AMLODIPINE BESYLATE 5 MG TA: 5 | 30 days supply | Qty: 30 | Fill #2

## 2017-08-26 MED FILL — OMEPRAZOLE DR 40 MG CAPSULE: 40 | 30 days supply | Qty: 30 | Fill #0

## 2017-08-26 MED FILL — VALSARTAN-HCTZ 320-12.5 MG: 320-12.5 | 30 days supply | Qty: 30 | Fill #2

## 2017-09-07 ENCOUNTER — Ambulatory Visit: Payer: No Typology Code available for payment source

## 2017-09-16 ENCOUNTER — Ambulatory Visit: Payer: Self-pay | Attending: Family Medicine

## 2017-09-20 ENCOUNTER — Ambulatory Visit: Payer: No Typology Code available for payment source

## 2017-09-27 MED FILL — AMLODIPINE BESYLATE 5 MG TA: 5 | 30 days supply | Qty: 30 | Fill #3

## 2017-09-27 MED FILL — VALSARTAN-HCTZ 320-12.5 MG: 320-12.5 | 30 days supply | Qty: 30 | Fill #3

## 2017-10-10 ENCOUNTER — Other Ambulatory Visit: Payer: Self-pay

## 2017-10-10 ENCOUNTER — Ambulatory Visit (AMBULATORY_SURGERY_CENTER): Payer: Self-pay | Admitting: *Deleted

## 2017-10-10 ENCOUNTER — Other Ambulatory Visit: Payer: No Typology Code available for payment source

## 2017-10-10 VITALS — Ht 61.5 in | Wt 213.0 lb

## 2017-10-10 DIAGNOSIS — Z1211 Encounter for screening for malignant neoplasm of colon: Secondary | ICD-10-CM

## 2017-10-10 MED ORDER — PLENVU 140 G PO SOLR: 1.0000 | ORAL | 0 refills | Status: DC

## 2017-10-10 NOTE — Progress Notes (Signed)
Patient denies any allergies to eggs or soy. Patient denies any problems with anesthesia/sedation. Patient denies any oxygen use at home. Patient denies taking any diet/weight loss medications or blood thinners. EMMI education assisgned to patient on colonoscopy, this was explained and instructions given to patient. Plenvu sample kit given to patient. Patient is aware she needs carepartner this was discussed in detail during pv today.

## 2017-10-18 ENCOUNTER — Encounter: Payer: Self-pay | Admitting: Gastroenterology

## 2017-10-26 MED FILL — AMLODIPINE BESYLATE 5 MG TA: 5 | 30 days supply | Qty: 30 | Fill #4

## 2017-10-26 MED FILL — OMEPRAZOLE DR 40 MG CAPSULE: 40 | 30 days supply | Qty: 30 | Fill #1

## 2017-11-04 MED FILL — VALSARTAN-HCTZ 320-12.5 MG: 320-12.5 | 30 days supply | Qty: 30 | Fill #4

## 2017-11-30 MED FILL — VALSARTAN-HCTZ 320-12.5 MG: 320-12.5 | 30 days supply | Qty: 30 | Fill #5

## 2017-11-30 MED FILL — ?AMLODIPINE BESYLATE 5 MG T: 5 MG | 30 days supply | Qty: 30 | Fill #5

## 2017-11-30 MED FILL — OMEPRAZOLE DR 40 MG CAPSULE: 40 | 30 days supply | Qty: 30 | Fill #2

## 2017-12-05 ENCOUNTER — Encounter: Payer: Self-pay | Admitting: Gastroenterology

## 2017-12-05 ENCOUNTER — Ambulatory Visit (AMBULATORY_SURGERY_CENTER): Payer: Medicare Other | Admitting: Gastroenterology

## 2017-12-05 VITALS — BP 116/79 | HR 74 | Temp 99.6°F | Resp 17 | Ht 61.0 in | Wt 213.0 lb

## 2017-12-05 DIAGNOSIS — D124 Benign neoplasm of descending colon: Secondary | ICD-10-CM | POA: Diagnosis not present

## 2017-12-05 DIAGNOSIS — Z1212 Encounter for screening for malignant neoplasm of rectum: Secondary | ICD-10-CM

## 2017-12-05 DIAGNOSIS — Z1211 Encounter for screening for malignant neoplasm of colon: Secondary | ICD-10-CM

## 2017-12-05 MED ORDER — SODIUM CHLORIDE 0.9 % IV SOLN: 500.0000 mL | Freq: Once | INTRAVENOUS | Status: DC

## 2017-12-05 NOTE — Progress Notes (Signed)
To recovery, report to RN, VSS. 

## 2017-12-05 NOTE — Progress Notes (Signed)
No problems noted in the recovery room. maw 

## 2017-12-05 NOTE — Progress Notes (Signed)
Called to room to assist during endoscopic procedure.  Patient ID and intended procedure confirmed with present staff. Received instructions for my participation in the procedure from the performing physician.  

## 2017-12-05 NOTE — Patient Instructions (Signed)
YOU HAD AN ENDOSCOPIC PROCEDURE TODAY AT THE [facility]:   Refer to the procedure report that was given to you for any specific questions about what was found during the examination.  If the procedure report does not answer your questions, please call your gastroenterologist to clarify.  If you requested that your care partner not be given the details of your procedure findings, then the procedure report has been included in a sealed envelope for you to review at your convenience later.  YOU SHOULD EXPECT: Some feelings of bloating in the abdomen. Passage of more gas than usual.  Walking can help get rid of the air that was put into your GI tract during the procedure and reduce the bloating. If you had a lower endoscopy (such as a colonoscopy or flexible sigmoidoscopy) you may notice spotting of blood in your stool or on the toilet paper. If you underwent a bowel prep for your procedure, you may not have a normal bowel movement for a few days.  Please Note:  You might notice some irritation and congestion in your nose or some drainage.  This is from the oxygen used during your procedure.  There is no need for concern and it should clear up in a day or so.  SYMPTOMS TO REPORT IMMEDIATELY:   Following lower endoscopy (colonoscopy or flexible sigmoidoscopy):  Excessive amounts of blood in the stool  Significant tenderness or worsening of abdominal pains  Swelling of the abdomen that is new, acute  Fever of 100F or higher   For urgent or emergent issues, a gastroenterologist can be reached at any hour by calling [phone].   DIET:  We do recommend a small meal at first, but then you may proceed to your regular diet.  Drink plenty of fluids but you should avoid alcoholic beverages for 24 hours.  ACTIVITY:  You should plan to take it easy for the rest of today and you should NOT DRIVE or use heavy machinery until tomorrow (because of the sedation medicines used during the test).     FOLLOW UP: Our staff will call the number listed on your records the next business day following your procedure to check on you and address any questions or concerns that you may have regarding the information given to you following your procedure. If we do not reach you, we will leave a message.  However, if you are feeling well and you are not experiencing any problems, there is no need to return our call.  We will assume that you have returned to your regular daily activities without incident.  If any biopsies were taken you will be contacted by phone or by letter within the next 1-3 weeks.  Please call us at [phone] if you have not heard about the biopsies in 3 weeks.    SIGNATURES/CONFIDENTIALITY: You and/or your care partner have signed paperwork which will be entered into your electronic medical record.  These signatures attest to the fact that that the information above on your After Visit Summary has been reviewed and is understood.  Full responsibility of the confidentiality of this discharge information lies with you and/or your care-partner.   Handout was given to your care partner on polyps. You may resume your current medications today. Await biopsy results. Please call if any questions or concerns.   

## 2017-12-05 NOTE — Progress Notes (Signed)
Pt's states no medical or surgical changes since previsit or office visit. 

## 2017-12-05 NOTE — Op Note (Addendum)
[facility] Patient Name: Deanna Floyd Procedure Date: 12/05/2017 2:18 PM MRN: [id number] Endoscopist: Mallie Mussel L. Loletha Carrow , MD Age: 57 Referring MD:  Date of Birth: [birth year] Gender: Female Account #: [id number] Procedure:                Colonoscopy Indications:              Screening for colorectal malignant neoplasm, This                            is the patient's first screening colonoscopy Medicines:                Monitored Anesthesia Care Procedure:                Pre-Anesthesia Assessment:                           - Prior to the procedure, a History and Physical                            was performed, and patient medications and                            allergies were reviewed. The patient's tolerance of                            previous anesthesia was also reviewed. The risks                            and benefits of the procedure and the sedation                            options and risks were discussed with the patient.                            All questions were answered, and informed consent                            was obtained. Prior Anticoagulants: The patient has                            taken no previous anticoagulant or antiplatelet                            agents. ASA Grade Assessment: II - A patient with                            mild systemic disease. After reviewing the risks                            and benefits, the patient was deemed in                            satisfactory condition to undergo the procedure.  After obtaining informed consent, the colonoscope                            was passed under direct vision. Throughout the                            procedure, the patient's blood pressure, pulse, and                            oxygen saturations were monitored continuously. The                            Colonoscope was introduced through the anus and                            advanced to the  the cecum, identified by                            appendiceal orifice and ileocecal valve. The                            colonoscopy was performed without difficulty. The                            patient tolerated the procedure well. The quality                            of the bowel preparation was good. The ileocecal                            valve, appendiceal orifice, and rectum were                            photographed. The quality of the bowel preparation                            was evaluated using the BBPS [facility] Bowel                            Preparation Scale) with scores of: Right Colon = 2,                            Transverse Colon = 2 and Left Colon = 2. The total                            BBPS score equals 6. The bowel preparation used was                            SUPREP. Scope In: 2:25:01 PM Scope Out: 2:37:50 PM Scope Withdrawal Time: 0 hours 10 minutes 20 seconds  Total Procedure Duration: 0 hours 12 minutes 49 seconds  Findings:                 The perianal and digital rectal  examinations were                            normal.                           - There was a thin, firm fragment of food material                            or bone superficially impaled through a mucosal                            fold in the proximal ascending colon. It was easily                            removed and pulled out through the scope with a                            biopsy forceps.                           A 4 mm polyp was found in the proximal descending                            colon. The polyp was sessile. The polyp was removed                            with a cold biopsy forceps. Resection and retrieval                            were complete.                           The exam was otherwise without abnormality on                            direct and retroflexion views. Complications:            No immediate complications. Estimated Blood Loss:      Estimated blood loss: none. Impression:               - Fibrous food fragment or bone removed from                            ascending colon.                           One 4 mm polyp in the proximal descending colon,                            removed with a cold biopsy forceps. Resected and                            retrieved.                           -  The examination was otherwise normal on direct                            and retroflexion views. Recommendation:           - Patient has a contact number available for                            emergencies. The signs and symptoms of potential                            delayed complications were discussed with the                            patient. Return to normal activities tomorrow.                            Written discharge instructions were provided to the                            patient.                           - Resume previous diet.                           - Continue present medications.                           - Await pathology results.                           - Repeat colonoscopy is recommended for                            surveillance. The colonoscopy date will be                            determined after pathology results from today's                            exam become available for review. Claudy Abdallah L. Loletha Carrow, MD 12/05/2017 2:44:26 PM This report has been signed electronically.

## 2017-12-06 ENCOUNTER — Telehealth: Payer: Self-pay

## 2017-12-06 NOTE — Telephone Encounter (Signed)
  Follow up Call-  Call back number 12/05/2017  Post procedure Call Back phone  # [phone]  Permission to leave phone message Yes     Patient questions:  Do you have a fever, pain , or abdominal swelling? No. Pain Score  0 *  Have you tolerated food without any problems? Yes.    Have you been able to return to your normal activities? Yes.    Do you have any questions about your discharge instructions: Diet   No. Medications  No. Follow up visit  No.  Do you have questions or concerns about your Care? No.  Actions: * If pain score is 4 or above: No action needed, pain <4.

## 2017-12-09 ENCOUNTER — Encounter: Payer: Self-pay | Admitting: Gastroenterology

## 2017-12-30 MED FILL — VALSARTAN-HCTZ 320-12.5 MG: 320-12.5 | 30 days supply | Qty: 30 | Fill #6

## 2017-12-30 MED FILL — ?AMLODIPINE BESYLATE 5 MG T: 5 MG | 30 days supply | Qty: 30 | Fill #6

## 2017-12-30 MED FILL — OMEPRAZOLE DR 40 MG CAPSULE: 40 | 30 days supply | Qty: 30 | Fill #3

## 2018-01-25 ENCOUNTER — Other Ambulatory Visit: Payer: Self-pay | Admitting: Family Medicine

## 2018-01-25 MED FILL — VALSARTAN-HCTZ 320-12.5 MG: 320-12.5 | 30 days supply | Qty: 30 | Fill #7

## 2018-01-25 MED FILL — OMEPRAZOLE DR 40 MG CAPSULE: 40 | 30 days supply | Qty: 30 | Fill #0

## 2018-01-25 MED FILL — ?AMLODIPINE BESYLATE 5 MG T: 5 MG | 30 days supply | Qty: 30 | Fill #7

## 2018-02-01 ENCOUNTER — Ambulatory Visit (INDEPENDENT_AMBULATORY_CARE_PROVIDER_SITE_OTHER): Payer: Medicare HMO | Admitting: Family Medicine

## 2018-02-01 ENCOUNTER — Encounter: Payer: Self-pay | Admitting: Family Medicine

## 2018-02-01 VITALS — BP 118/74 | HR 88 | Temp 98.2°F | Ht 61.0 in | Wt 213.0 lb

## 2018-02-01 DIAGNOSIS — Z6841 Body Mass Index (BMI) 40.0 and over, adult: Secondary | ICD-10-CM | POA: Diagnosis not present

## 2018-02-01 DIAGNOSIS — R7303 Prediabetes: Secondary | ICD-10-CM | POA: Diagnosis not present

## 2018-02-01 DIAGNOSIS — Z130 Encounter for screening for diseases of the blood and blood-forming organs and certain disorders involving the immune mechanism: Secondary | ICD-10-CM | POA: Diagnosis not present

## 2018-02-01 DIAGNOSIS — M79672 Pain in left foot: Secondary | ICD-10-CM | POA: Diagnosis not present

## 2018-02-01 DIAGNOSIS — Z136 Encounter for screening for cardiovascular disorders: Secondary | ICD-10-CM | POA: Diagnosis not present

## 2018-02-01 LAB — POCT URINALYSIS DIP (DEVICE)
Bilirubin Urine: NEGATIVE
GLUCOSE, UA: NEGATIVE mg/dL
Hgb urine dipstick: NEGATIVE
Ketones, ur: NEGATIVE mg/dL
LEUKOCYTES UA: NEGATIVE
NITRITE: NEGATIVE
Protein, ur: NEGATIVE mg/dL
SPECIFIC GRAVITY, URINE: 1.025 (ref 1.005–1.030)
UROBILINOGEN UA: 0.2 mg/dL (ref 0.0–1.0)
pH: 5.5 (ref 5.0–8.0)

## 2018-02-01 LAB — POCT GLYCOSYLATED HEMOGLOBIN (HGB A1C): Hemoglobin A1C: 5.5

## 2018-02-01 MED ORDER — ACETAMINOPHEN-CODEINE 300-30 MG PO TABS: 1.0000 | ORAL_TABLET | Freq: Every day | ORAL | 0 refills | Status: DC

## 2018-02-01 MED FILL — ACETAMINOPHEN/COD #3 TABLET: 300-30 | 30 days supply | Qty: 30 | Fill #0

## 2018-02-01 NOTE — Progress Notes (Signed)
Patient ID: Deanna Floyd, female    DOB: 1961-06-28, 57 y.o.   MRN: [id number]  PCP: Scot Jun, FNP  Chief Complaint  Patient presents with  . Annual Exam    Subjective:  HPI Deanna Floyd is a 57 y.o. female presents for a wellness visit. Camya suffers from chronic pain syndrome and has been working to obtain complete disability. She recently obtained medicare. Requests a handicap placard today. She has no new concerns or issues today. She suffers from hypertension denies any routine monitoring of blood pressure. Take medications as prescribed. She denies chest pain, shortness of breath, weakness, headache, or dizziness. Continue to have left foot pain. This is not a new problem. She has been seen and evaluated in the past by podiatry and treated with antiinflammatory and steroid therapies. Social History   Socioeconomic History  . Marital status: Single    Spouse name: Not on file  . Number of children: Not on file  . Years of education: Not on file  . Highest education level: Not on file  Occupational History  . Not on file  Social Needs  . Financial resource strain: Not on file  . Food insecurity:    Worry: Not on file    Inability: Not on file  . Transportation needs:    Medical: Not on file    Non-medical: Not on file  Tobacco Use  . Smoking status: Former Research scientist (life sciences)  . Smokeless tobacco: Never Used  Substance and Sexual Activity  . Alcohol use: Yes    Comment: occ wine   . Drug use: No  . Sexual activity: Never  Lifestyle  . Physical activity:    Days per week: Not on file    Minutes per session: Not on file  . Stress: Not on file  Relationships  . Social connections:    Talks on phone: Not on file    Gets together: Not on file    Attends religious service: Not on file    Active member of club or organization: Not on file    Attends meetings of clubs or organizations: Not on file    Relationship status: Not on file  . Intimate partner violence:     Fear of current or ex partner: Not on file    Emotionally abused: Not on file    Physically abused: Not on file    Forced sexual activity: Not on file  Other Topics Concern  . Not on file  Social History Narrative  . Not on file    Family History  Problem Relation Age of Onset  . Hypertension Sister   . Colon cancer Neg Hx   . Stomach cancer Neg Hx    Review of Systems Pertinent negatives listed in HPI  Patient Active Problem List   Diagnosis Date Noted  . Carpal tunnel syndrome on both sides 11/19/2016  . Metabolic syndrome 25/36/6440  . Essential hypertension 11/19/2016  . Allergic dermatitis 11/19/2016  . Allergy status to unspecified drugs, medicaments and biological substances status 11/19/2016  . BP check 09/22/2016  . Post-operative state 06/02/2016  . Obesity 01/08/2016  . Low back pain 10/06/2015  . Pain in joint, ankle and foot 10/06/2015    Allergies  Allergen Reactions  . Hydrocortisone Hives  . Lyrica [Pregabalin] Rash    Prior to Admission medications   Medication Sig Start Date End Date Taking? Authorizing Provider  amLODipine (NORVASC) 5 MG tablet Take 1 tablet (5 mg total) by mouth daily. 06/27/17  Yes Scot Jun, FNP  diphenhydrAMINE (BENADRYL) 2 % cream Apply topically 3 (three) times daily as needed for itching. 06/14/17  Yes Scot Jun, FNP  Multiple Vitamin (MULTIVITAMIN WITH MINERALS) TABS tablet Take 1 tablet by mouth daily.   Yes [provider]  omeprazole (PRILOSEC) 40 MG capsule TAKE 1 CAPSULE (40 MG TOTAL) BY MOUTH DAILY. 01/25/18  Yes Scot Jun, FNP  valsartan-hydrochlorothiazide (DIOVAN HCT) 320-12.5 MG tablet Take 1 tablet by mouth daily. 06/27/17  Yes Scot Jun, FNP  Acetaminophen-Codeine (TYLENOL/CODEINE #3) 300-30 MG tablet Take 1 tablet by mouth at bedtime. Patient not taking: Reported on 02/01/2018 07/04/17   Scot Jun, FNP  gabapentin (NEURONTIN) 300 MG capsule Take 1 capsule (300 mg  total) by mouth at bedtime. Patient not taking: Reported on 02/01/2018 08/31/16   Landis Martins, DPM  meloxicam (MOBIC) 15 MG tablet Take 1 tablet (15 mg total) by mouth daily. Patient not taking: Reported on 02/01/2018 07/04/17   Scot Jun, FNP    Past Medical, Surgical Family and Social History reviewed and updated.    Objective:   Today's Vitals   02/01/18 1320  BP: 118/74  Pulse: 88  Temp: 98.2 F (36.8 C)  TempSrc: Oral  SpO2: 98%  Weight: 213 lb (96.6 kg)  Height: 5\' 1"  (1.549 m)    Wt Readings from Last 3 Encounters:  02/01/18 213 lb (96.6 kg)  12/05/17 213 lb (96.6 kg)  10/10/17 213 lb (96.6 kg)    Physical Exam Constitutional: Patient appears well-developed and well-nourished. No distress. HENT: Normocephalic, atraumatic, External right and left ear normal. Eyes: Conjunctivae and EOM are normal. PERRLA, no scleral icterus. Neck: Normal ROM. Neck supple. No JVD. No tracheal deviation. No thyromegaly. CVS: RRR, S1/S2 +, no murmurs, no gallops, no carotid bruit.  Pulmonary: Effort and breath sounds normal, no stridor, rhonchi, wheezes, rales.  Abdominal: Soft. BS +, no distension, tenderness, rebound or guarding.  Musculoskeletal: Normal range of motion. No edema and no tenderness.  Neuro: Alert. Normal reflexes, muscle tone coordination. No cranial nerve deficit. Skin: Skin is warm and dry. No rash noted. Not diaphoretic. No erythema. No pallor. Psychiatric: Normal mood and affect. Behavior, judgment, thought content normal.  Assessment & Plan:  1. Prediabetes, A1C 5.5. Normal. Recheck in 12 months. Continue to increase physical activity as tolerated and reduce intake foods rich in simple carbohydrates. Checking CMP and fasting lipid panel. Referring for diabetes eye exam- Ambulatory referral to Ophthalmology  2. Left foot pain, chronic. Referring for a second opinion with podiatry. Agreed to continue a limited quanityf    3. Class 3 severe obesity with  body mass index (BMI) of 40.0 to 44.9 in adult, unspecified obesity type, unspecified whether serious comorbidity present ([facility]) Goal BMI  <25. Encouraged efforts to reduce weight include engaging in physical activity as tolerated with goal of 150 minutes per week. Improve dietary choices and eat a meal regimen consistent with a Mediterranean or DASH diet. Reduce simple carbohydrates. Do not skip meals and eat healthy snacks throughout the day to avoid over-eating at dinner. Set a goal weight loss that is achievable for you. Fasting  Lipid panel; pending  4. Screening for deficiency anemia, CBC with Differential; Future  5. Screening for cardiovascular condition, - EKG 12-Lead, negative of ischemia. NSR.   Orders Placed This Encounter  Procedures  . Comprehensive metabolic panel  . Lipid panel  . CBC with Differential  . Ambulatory referral to Podiatry  . Ambulatory  referral to Ophthalmology  . POCT glycosylated hemoglobin (Hb A1C)  . POCT urinalysis dip (device)  . EKG 12-Lead    Meds ordered this encounter  Medications  . Acetaminophen-Codeine (TYLENOL/CODEINE #3) 300-30 MG tablet    Sig: Take 1 tablet by mouth at bedtime.    Dispense:  30 tablet    Refill:  0    Order Specific Question:   Supervising Provider    Answer:   Tresa Garter [[id number]]      RTC: 6 months for routine wellness visit  [name]. [name], MSN, FNP-C [facility]  [address] Barbara Cower [place], [place] [address] [phone]

## 2018-02-01 NOTE — Patient Instructions (Addendum)
Return in 1 week for fasting labs.  I am referring you for a diabetes eye exam at [facility].

## 2018-02-03 ENCOUNTER — Other Ambulatory Visit: Payer: Medicare Other

## 2018-02-03 DIAGNOSIS — Z6841 Body Mass Index (BMI) 40.0 and over, adult: Secondary | ICD-10-CM

## 2018-02-03 DIAGNOSIS — R7303 Prediabetes: Secondary | ICD-10-CM

## 2018-02-03 DIAGNOSIS — Z130 Encounter for screening for diseases of the blood and blood-forming organs and certain disorders involving the immune mechanism: Secondary | ICD-10-CM | POA: Diagnosis not present

## 2018-02-04 LAB — CBC WITH DIFFERENTIAL/PLATELET
BASOS ABS: 0 10*3/uL (ref 0.0–0.2)
Basos: 0 %
EOS (ABSOLUTE): 0.1 10*3/uL (ref 0.0–0.4)
Eos: 3 %
HEMOGLOBIN: 12.1 g/dL (ref 11.1–15.9)
Hematocrit: 37.5 % (ref 34.0–46.6)
IMMATURE GRANS (ABS): 0 10*3/uL (ref 0.0–0.1)
Immature Granulocytes: 0 %
LYMPHS: 37 %
Lymphocytes Absolute: 2 10*3/uL (ref 0.7–3.1)
MCH: 32.2 pg (ref 26.6–33.0)
MCHC: 32.3 g/dL (ref 31.5–35.7)
MCV: 100 fL — ABNORMAL HIGH (ref 79–97)
MONOCYTES: 10 %
Monocytes Absolute: 0.5 10*3/uL (ref 0.1–0.9)
NEUTROS ABS: 2.6 10*3/uL (ref 1.4–7.0)
Neutrophils: 50 %
PLATELETS: 284 10*3/uL (ref 150–379)
RBC: 3.76 x10E6/uL — AB (ref 3.77–5.28)
RDW: 14 % (ref 12.3–15.4)
WBC: 5.2 10*3/uL (ref 3.4–10.8)

## 2018-02-04 LAB — COMPREHENSIVE METABOLIC PANEL
ALT: 21 IU/L (ref 0–32)
AST: 19 IU/L (ref 0–40)
Albumin/Globulin Ratio: 1.6 (ref 1.2–2.2)
Albumin: 4.5 g/dL (ref 3.5–5.5)
Alkaline Phosphatase: 78 IU/L (ref 39–117)
BUN/Creatinine Ratio: 23 (ref 9–23)
BUN: 19 mg/dL (ref 6–24)
Bilirubin Total: <0.2 mg/dL (ref 0.0–1.2)
CHLORIDE: 102 mmol/L (ref 96–106)
CO2: 22 mmol/L (ref 20–29)
Calcium: 9.4 mg/dL (ref 8.7–10.2)
Creatinine, Ser: 0.81 mg/dL (ref 0.57–1.00)
GFR, EST AFRICAN AMERICAN: 93 mL/min/{1.73_m2} (ref >59)
GFR, EST NON AFRICAN AMERICAN: 80 mL/min/{1.73_m2} (ref >59)
Globulin, Total: 2.8 g/dL (ref 1.5–4.5)
Glucose: 99 mg/dL (ref 65–99)
Potassium: 4 mmol/L (ref 3.5–5.2)
Sodium: 142 mmol/L (ref 134–144)
Total Protein: 7.3 g/dL (ref 6.0–8.5)

## 2018-02-04 LAB — LIPID PANEL
CHOLESTEROL TOTAL: 186 mg/dL (ref 100–199)
Chol/HDL Ratio: 2.3 ratio (ref 0.0–4.4)
HDL: 80 mg/dL (ref >39)
LDL CALC: 89 mg/dL (ref 0–99)
TRIGLYCERIDES: 85 mg/dL (ref 0–149)
VLDL CHOLESTEROL CAL: 17 mg/dL (ref 5–40)

## 2018-02-05 ENCOUNTER — Encounter: Payer: Self-pay | Admitting: Family Medicine

## 2018-02-05 NOTE — Progress Notes (Signed)
Mail lab letter  

## 2018-02-14 ENCOUNTER — Ambulatory Visit: Payer: No Typology Code available for payment source | Admitting: Family Medicine

## 2018-02-14 ENCOUNTER — Ambulatory Visit: Payer: Self-pay | Admitting: Podiatry

## 2018-02-20 DIAGNOSIS — H40013 Open angle with borderline findings, low risk, bilateral: Secondary | ICD-10-CM | POA: Diagnosis not present

## 2018-02-20 DIAGNOSIS — H10413 Chronic giant papillary conjunctivitis, bilateral: Secondary | ICD-10-CM | POA: Diagnosis not present

## 2018-02-27 MED FILL — ?AMLODIPINE BESYLATE 5 MG T: 5 MG | 30 days supply | Qty: 30 | Fill #8

## 2018-03-01 MED FILL — OMEPRAZOLE DR 40 MG CAPSULE: 40 | 30 days supply | Qty: 30 | Fill #1

## 2018-03-03 ENCOUNTER — Other Ambulatory Visit: Payer: Self-pay | Admitting: Family Medicine

## 2018-03-03 ENCOUNTER — Telehealth: Payer: Self-pay

## 2018-03-03 DIAGNOSIS — I10 Essential (primary) hypertension: Secondary | ICD-10-CM

## 2018-03-03 MED ORDER — LISINOPRIL-HYDROCHLOROTHIAZIDE 20-12.5 MG PO TABS: 1.0000 | ORAL_TABLET | Freq: Every day | ORAL | 5 refills | Status: DC

## 2018-03-03 MED FILL — LISINOPRIL-HCTZ 20-12.5 MG: 20-12.5 | 30 days supply | Qty: 30 | Fill #0

## 2018-03-03 NOTE — Telephone Encounter (Signed)
Valsartan-hydrochlorothiazide needs to be changed because it's on recall. Patient is at pharmacy and use to see Deanna Floyd.

## 2018-03-03 NOTE — Progress Notes (Signed)
Patient notified and scheduled for 5/3 for blood pressure check

## 2018-03-03 NOTE — Progress Notes (Signed)
Meds ordered this encounter  Medications  . lisinopril-hydrochlorothiazide (PRINZIDE,ZESTORETIC) 20-12.5 MG tablet    Sig: Take 1 tablet by mouth daily.    Dispense:  30 tablet    Refill:  [place]  MSN, FNP-C [facility] [address] [place], [place] [address] [phone]

## 2018-03-07 DIAGNOSIS — H2513 Age-related nuclear cataract, bilateral: Secondary | ICD-10-CM | POA: Diagnosis not present

## 2018-03-07 DIAGNOSIS — H11133 Conjunctival pigmentations, bilateral: Secondary | ICD-10-CM | POA: Diagnosis not present

## 2018-03-07 DIAGNOSIS — H1013 Acute atopic conjunctivitis, bilateral: Secondary | ICD-10-CM | POA: Diagnosis not present

## 2018-03-10 ENCOUNTER — Other Ambulatory Visit: Payer: Self-pay | Admitting: Family Medicine

## 2018-03-10 ENCOUNTER — Other Ambulatory Visit: Payer: Medicare HMO

## 2018-03-10 DIAGNOSIS — D649 Anemia, unspecified: Secondary | ICD-10-CM | POA: Diagnosis not present

## 2018-03-10 NOTE — Progress Notes (Signed)
Orders Placed This Encounter  Procedures  . CBC w/Diff/Platelet    Standing Status:   Future    Standing Expiration Date:   03/11/2019     Donia Pounds  MSN, FNP-C [facility] [address] [place], [place] [address] [phone]

## 2018-03-11 ENCOUNTER — Other Ambulatory Visit: Payer: Self-pay | Admitting: Family Medicine

## 2018-03-11 LAB — CBC WITH DIFFERENTIAL/PLATELET
Basophils Absolute: 0 10*3/uL (ref 0.0–0.2)
Basos: 0 %
EOS (ABSOLUTE): 0.2 10*3/uL (ref 0.0–0.4)
EOS: 4 %
HEMATOCRIT: 35.9 % (ref 34.0–46.6)
HEMOGLOBIN: 11.9 g/dL (ref 11.1–15.9)
Immature Grans (Abs): 0 10*3/uL (ref 0.0–0.1)
Immature Granulocytes: 0 %
LYMPHS ABS: 2.5 10*3/uL (ref 0.7–3.1)
Lymphs: 46 %
MCH: 32.9 pg (ref 26.6–33.0)
MCHC: 33.1 g/dL (ref 31.5–35.7)
MCV: 99 fL — AB (ref 79–97)
MONOCYTES: 13 %
MONOS ABS: 0.7 10*3/uL (ref 0.1–0.9)
NEUTROS ABS: 2.1 10*3/uL (ref 1.4–7.0)
Neutrophils: 37 %
Platelets: 249 10*3/uL (ref 150–379)
RBC: 3.62 x10E6/uL — AB (ref 3.77–5.28)
RDW: 13.4 % (ref 12.3–15.4)
WBC: 5.6 10*3/uL (ref 3.4–10.8)

## 2018-03-13 ENCOUNTER — Telehealth: Payer: Self-pay

## 2018-03-13 NOTE — Telephone Encounter (Signed)
Patient notified

## 2018-03-13 NOTE — Telephone Encounter (Signed)
-----   Message from Dorena Dew, [place] sent at 03/11/2018  6:40 AM EDT ----- Regarding: lab results Please inform patient that anemia has resolved. No medication changes warranted on today. Continue an iron rich diet (green leafy veggies, organ meats, beans, or iron fortified cereal). Follow up in office as scheduled.   Donia Pounds  MSN, FNP-C [facility] [address] [place], [place] [address] [phone]

## 2018-03-30 MED FILL — AMLODIPINE BESYLATE 5 MG TA: 5 | 30 days supply | Qty: 30 | Fill #9

## 2018-03-30 MED FILL — OMEPRAZOLE DR 40 MG CAPSULE: 40 | 30 days supply | Qty: 30 | Fill #2

## 2018-03-30 MED FILL — LISINOPRIL-HCTZ 20-12.5 MG: 20-12.5 | 30 days supply | Qty: 30 | Fill #1

## 2018-04-28 MED FILL — AMLODIPINE BESYLATE 5 MG TA: 5 | 30 days supply | Qty: 30 | Fill #10

## 2018-04-28 MED FILL — LISINOPRIL-HCTZ 20-12.5 MG: 20-12.5 | 30 days supply | Qty: 30 | Fill #2

## 2018-04-28 MED FILL — OMEPRAZOLE DR 40 MG CAPSULE: 40 | 30 days supply | Qty: 30 | Fill #3

## 2018-05-30 ENCOUNTER — Other Ambulatory Visit: Payer: Self-pay | Admitting: Family Medicine

## 2018-05-30 MED FILL — LISINOPRIL-HCTZ 20-12.5 MG: 20-12.5 | 30 days supply | Qty: 30 | Fill #3

## 2018-05-30 MED FILL — AMLODIPINE BESYLATE 5 MG TA: 5 | 30 days supply | Qty: 30 | Fill #11

## 2018-05-31 MED FILL — OMEPRAZOLE DR 40 MG CAPSULE: 40 | 90 days supply | Qty: 90 | Fill #0

## 2018-06-29 MED FILL — LISINOPRIL-HCTZ 20-12.5 MG: 20-12.5 | 30 days supply | Qty: 30 | Fill #4

## 2018-07-03 ENCOUNTER — Telehealth: Payer: Self-pay

## 2018-07-03 DIAGNOSIS — I10 Essential (primary) hypertension: Secondary | ICD-10-CM

## 2018-07-03 MED ORDER — AMLODIPINE BESYLATE 5 MG PO TABS: 5.0000 mg | ORAL_TABLET | Freq: Every day | ORAL | 0 refills | Status: DC

## 2018-07-03 MED ORDER — LISINOPRIL-HYDROCHLOROTHIAZIDE 20-12.5 MG PO TABS: 1.0000 | ORAL_TABLET | Freq: Every day | ORAL | 0 refills | Status: DC

## 2018-07-03 MED FILL — AMLODIPINE BESYLATE 5 MG TA: 5 | 30 days supply | Qty: 30 | Fill #0

## 2018-07-03 NOTE — Telephone Encounter (Signed)
Refilled sent and patient aware of her appointment in September with Lanelle Bal

## 2018-07-31 ENCOUNTER — Other Ambulatory Visit: Payer: Self-pay | Admitting: Family Medicine

## 2018-07-31 MED FILL — LISINOPRIL-HCTZ 20-12.5 MG: 20-12.5 | 30 days supply | Qty: 30 | Fill #0

## 2018-08-01 ENCOUNTER — Ambulatory Visit (INDEPENDENT_AMBULATORY_CARE_PROVIDER_SITE_OTHER): Payer: Medicare HMO | Admitting: Family Medicine

## 2018-08-01 ENCOUNTER — Encounter: Payer: Self-pay | Admitting: Family Medicine

## 2018-08-01 VITALS — BP 134/88 | HR 79 | Temp 97.8°F | Ht 61.0 in | Wt 215.0 lb

## 2018-08-01 DIAGNOSIS — Z1231 Encounter for screening mammogram for malignant neoplasm of breast: Secondary | ICD-10-CM | POA: Diagnosis not present

## 2018-08-01 DIAGNOSIS — Z131 Encounter for screening for diabetes mellitus: Secondary | ICD-10-CM | POA: Diagnosis not present

## 2018-08-01 DIAGNOSIS — M79672 Pain in left foot: Secondary | ICD-10-CM

## 2018-08-01 DIAGNOSIS — Z1239 Encounter for other screening for malignant neoplasm of breast: Secondary | ICD-10-CM

## 2018-08-01 DIAGNOSIS — M5442 Lumbago with sciatica, left side: Secondary | ICD-10-CM

## 2018-08-01 DIAGNOSIS — T7840XA Allergy, unspecified, initial encounter: Secondary | ICD-10-CM

## 2018-08-01 DIAGNOSIS — I10 Essential (primary) hypertension: Secondary | ICD-10-CM

## 2018-08-01 DIAGNOSIS — R21 Rash and other nonspecific skin eruption: Secondary | ICD-10-CM

## 2018-08-01 DIAGNOSIS — Z09 Encounter for follow-up examination after completed treatment for conditions other than malignant neoplasm: Secondary | ICD-10-CM

## 2018-08-01 DIAGNOSIS — M5441 Lumbago with sciatica, right side: Secondary | ICD-10-CM

## 2018-08-01 DIAGNOSIS — J302 Other seasonal allergic rhinitis: Secondary | ICD-10-CM

## 2018-08-01 DIAGNOSIS — Z23 Encounter for immunization: Secondary | ICD-10-CM

## 2018-08-01 DIAGNOSIS — G8929 Other chronic pain: Secondary | ICD-10-CM

## 2018-08-01 LAB — POCT GLYCOSYLATED HEMOGLOBIN (HGB A1C): Hemoglobin A1C: 5.3 % (ref 4.0–5.6)

## 2018-08-01 LAB — POCT URINALYSIS DIP (MANUAL ENTRY)
Bilirubin, UA: NEGATIVE
Blood, UA: NEGATIVE
Glucose, UA: NEGATIVE mg/dL
Ketones, POC UA: NEGATIVE mg/dL
Leukocytes, UA: NEGATIVE
Nitrite, UA: NEGATIVE
Protein Ur, POC: NEGATIVE mg/dL
Spec Grav, UA: 1.025 (ref 1.010–1.025)
Urobilinogen, UA: 0.2 E.U./dL
pH, UA: 6 (ref 5.0–8.0)

## 2018-08-01 MED ORDER — CETIRIZINE HCL 10 MG PO TABS: 10.0000 mg | ORAL_TABLET | Freq: Every day | ORAL | 11 refills | Status: DC

## 2018-08-01 MED ORDER — LISINOPRIL-HYDROCHLOROTHIAZIDE 20-12.5 MG PO TABS: 1.0000 | ORAL_TABLET | Freq: Every day | ORAL | 6 refills | Status: DC

## 2018-08-01 MED ORDER — DIPHENHYDRAMINE HCL 2 % EX CREA: TOPICAL_CREAM | Freq: Three times a day (TID) | CUTANEOUS | 1 refills | Status: DC | PRN

## 2018-08-01 MED ORDER — MELOXICAM 15 MG PO TABS: 15.0000 mg | ORAL_TABLET | Freq: Every day | ORAL | 2 refills | Status: DC

## 2018-08-01 MED ORDER — AMLODIPINE BESYLATE 5 MG PO TABS: 5.0000 mg | ORAL_TABLET | Freq: Every day | ORAL | 6 refills | Status: DC

## 2018-08-01 MED FILL — CETIRIZINE HCL 10 MG TABLET: 10 | 30 days supply | Qty: 30 | Fill #0

## 2018-08-01 MED FILL — AMLODIPINE BESYLATE 5 MG TA: 5 | 30 days supply | Qty: 30 | Fill #0

## 2018-08-01 MED FILL — MELOXICAM 15 MG TABLET: 15 | 30 days supply | Qty: 30 | Fill #0

## 2018-08-01 NOTE — Patient Instructions (Addendum)
Cetirizine tablets What is this medicine? CETIRIZINE (se TI ra zeen) is an antihistamine. This medicine is used to treat or prevent symptoms of allergies. It is also used to help reduce itchy skin rash and hives. This medicine may be used for other purposes; ask your health care provider or pharmacist if you have questions. COMMON BRAND NAME(S): All Day Allergy, Zyrtec, Zyrtec Hives Relief What should I tell my health care provider before I take this medicine? They need to know if you have any of these conditions: -kidney disease -liver disease -an unusual or allergic reaction to cetirizine, hydroxyzine, other medicines, foods, dyes, or preservatives -pregnant or trying to get pregnant -breast-feeding How should I use this medicine? Take this medicine by mouth with a glass of water. Follow the directions on the prescription label. You can take this medicine with food or on an empty stomach. Take your medicine at regular times. Do not take more often than directed. You may need to take this medicine for several days before your symptoms improve. Talk to your pediatrician regarding the use of this medicine in children. Special care may be needed. While this drug may be prescribed for children as young as 50 years of age for selected conditions, precautions do apply. Overdosage: If you think you have taken too much of this medicine contact a poison control center or emergency room at once. NOTE: This medicine is only for you. Do not share this medicine with others. What if I miss a dose? If you miss a dose, take it as soon as you can. If it is almost time for your next dose, take only that dose. Do not take double or extra doses. What may interact with this medicine? -alcohol -certain medicines for anxiety or sleep -narcotic medicines for pain -other medicines for colds or allergies This list may not describe all possible interactions. Give your health care provider a list of all the medicines,  herbs, non-prescription drugs, or dietary supplements you use. Also tell them if you smoke, drink alcohol, or use illegal drugs. Some items may interact with your medicine. What should I watch for while using this medicine? Visit your doctor or health care professional for regular checks on your health. Tell your doctor if your symptoms do not improve. You may get drowsy or dizzy. Do not drive, use machinery, or do anything that needs mental alertness until you know how this medicine affects you. Do not stand or sit up quickly, especially if you are an older patient. This reduces the risk of dizzy or fainting spells. Your mouth may get dry. Chewing sugarless gum or sucking hard candy, and drinking plenty of water may help. Contact your doctor if the problem does not go away or is severe. What side effects may I notice from receiving this medicine? Side effects that you should report to your doctor or health care professional as soon as possible: -allergic reactions like skin rash, itching or hives, swelling of the face, lips, or tongue -changes in vision or hearing -fast or irregular heartbeat -trouble passing urine or change in the amount of urine Side effects that usually do not require medical attention (report to your doctor or health care professional if they continue or are bothersome): -dizziness -dry mouth -irritability -sore throat -stomach pain -tiredness This list may not describe all possible side effects. Call your doctor for medical advice about side effects. You may report side effects to FDA at 1-800-FDA-1088. Where should I keep my medicine? Keep out of  the reach of children. Store at room temperature between 15 and 30 degrees C (59 and 86 degrees F). Throw away any unused medicine after the expiration date. NOTE: This sheet is a summary. It may not cover all possible information. If you have questions about this medicine, talk to your doctor, pharmacist, or health care  provider.  2018 Elsevier/Gold Standard (2014-11-19 13:44:42)     Diphenhydramine Topical What is this medicine? DIPHENHYDRAMINE (dye fen HYE dra meen) is an antihistamine. It is used on the skin to treat pain and itching from insect bites, sunburns, rashes, and other minor skin conditions. This medicine may be used for other purposes; ask your health care provider or pharmacist if you have questions. COMMON BRAND NAME(S): Banophen Anti-itch, Banophen Anti-Itch Extra Strength, Benadryl, Benadryl Itch Stopping, Benadryl ReadyMist Itch Stopping, Dermarest Insect Bite, Dermarest Plus, Diphenhist with Zinc Acetate, Itch Relief What should I tell my health care provider before I take this medicine? They need to know if you have any of these conditions: -chicken pox or measles -an unusual or allergic reaction to diphenhydramine, other medicines, foods, dyes, or preservatives -pregnant or trying to get pregnant -breast-feeding How should I use this medicine? This medicine is for external use only. Do not drink or use in the mouth. Follow the directions on the package. Wash your hands before and after use. Apply to the affected area as directed on the package or by your doctor or health care professional. Do not use this medicine more often than directed. Talk to your pediatrician regarding the use of this medicine in children. While this drug may be used in children as young as 2 years for selected conditions, precautions do apply. Overdosage: If you think you have taken too much of this medicine contact a poison control center or emergency room at once. NOTE: This medicine is only for you. Do not share this medicine with others. What if I miss a dose? If you miss a dose, use it as soon as you can. If it is almost time for your next dose, use only that dose. Do not use double or extra doses. What may interact with this medicine? Interactions are not expected. Do not use any other skin products on  the affected area without asking your doctor or health care professional. This list may not describe all possible interactions. Give your health care provider a list of all the medicines, herbs, non-prescription drugs, or dietary supplements you use. Also tell them if you smoke, drink alcohol, or use illegal drugs. Some items may interact with your medicine. What should I watch for while using this medicine? Tell your doctor or healthcare professional if your symptoms do not start to get better within 7 days or if they get worse. You may have a skin infection or other more serious skin condition. Do not use this medicine on large areas of the body or with other products that contain diphenhydramine. To do so may increase the risk of side effects. Do not get this medicine in your eyes. If you do, rinse with plenty of water. What side effects may I notice from receiving this medicine? Side effects that you should report to your doctor or health care professional as soon as possible: -allergic reactions like skin rash, itching or hives, swelling of the face, lips, or tongue Side effects that usually do not require medical attention (report to your doctor or health care professional if they continue or are bothersome): -drowsiness or dizziness -dry mouth -headache  This list may not describe all possible side effects. Call your doctor for medical advice about side effects. You may report side effects to FDA at 1-800-FDA-1088. Where should I keep my medicine? Keep out of the reach of children. Store at room temperature between 20 and 25 degrees C (68 and 77 degrees F). Do not freeze. Protect from light. Throw away any unused medicine after the expiration date. Some products may contain alcohol or have other flammable components. Do not use while smoking or near heat or flame. NOTE: This sheet is a summary. It may not cover all possible information. If you have questions about this medicine, talk to your  doctor, pharmacist, or health care provider.  2018 Elsevier/Gold Standard (2008-05-13 17:30:40)

## 2018-08-01 NOTE — Progress Notes (Signed)
Follow Up  Subjective:    Patient ID: Deanna Floyd, female    DOB: 12/22/1960, 57 y.o.   MRN: [id number]  Chief Complaint  Patient presents with  . Follow-up    chronic condition    HPI  Deanna Floyd is a 57 year old female with a past medical history of Stroke and Hypertension. She is here today for for follow up.   Current Status: Since her last office visit, she is doing well. She had left foot surgery in 2015 and is still experiencing pain and swelling.   She denies fevers, chills, fatigue, recent infections, weight loss, and night sweats. She has not had any headaches, visual changes, dizziness, and falls. No chest pain, heart palpitations, cough and shortness of breath reported. No reports of GI problems such as nausea, vomiting, diarrhea, and constipation. She has no reports of blood in stools, dysuria and hematuria. No depression or anxiety reported.   Past Medical History:  Diagnosis Date  . Hypertension   . Stroke [facility]) 2007    Family History  Problem Relation Age of Onset  . Hypertension Sister   . Colon cancer Neg Hx   . Stomach cancer Neg Hx     Social History   Socioeconomic History  . Marital status: Single    Spouse name: Not on file  . Number of children: Not on file  . Years of education: Not on file  . Highest education level: Not on file  Occupational History  . Not on file  Social Needs  . Financial resource strain: Not on file  . Food insecurity:    Worry: Not on file    Inability: Not on file  . Transportation needs:    Medical: Not on file    Non-medical: Not on file  Tobacco Use  . Smoking status: Former Research scientist (life sciences)  . Smokeless tobacco: Never Used  Substance and Sexual Activity  . Alcohol use: Yes    Comment: occ wine   . Drug use: No  . Sexual activity: Never  Lifestyle  . Physical activity:    Days per week: Not on file    Minutes per session: Not on file  . Stress: Not on file  Relationships  . Social connections:    Talks on  phone: Not on file    Gets together: Not on file    Attends religious service: Not on file    Active member of club or organization: Not on file    Attends meetings of clubs or organizations: Not on file    Relationship status: Not on file  . Intimate partner violence:    Fear of current or ex partner: Not on file    Emotionally abused: Not on file    Physically abused: Not on file    Forced sexual activity: Not on file  Other Topics Concern  . Not on file  Social History Narrative  . Not on file    Past Surgical History:  Procedure Laterality Date  . FOOT SURGERY  2015   left side had 2 surgeries in 2015  . TRIGGER FINGER RELEASE  2010    Immunization History  Administered Date(s) Administered  . Influenza,inj,Quad PF,6+ Mos 10/06/2015, 08/06/2016, 07/04/2017, 08/01/2018    Current Meds  Medication Sig  . amLODipine (NORVASC) 5 MG tablet Take 1 tablet (5 mg total) by mouth daily.  Marland Kitchen lisinopril-hydrochlorothiazide (PRINZIDE,ZESTORETIC) 20-12.5 MG tablet Take 1 tablet by mouth daily.  . Multiple Vitamin (MULTIVITAMIN WITH MINERALS) TABS tablet  Take 1 tablet by mouth daily.  Marland Kitchen omeprazole (PRILOSEC) 40 MG capsule TAKE 1 CAPSULE BY MOUTH DAILY.   Current Facility-Administered Medications for the 08/01/18 encounter (Office Visit) with Azzie Glatter, FNP  Medication  . 0.9 %  sodium chloride infusion    Allergies  Allergen Reactions  . Hydrocortisone Hives  . Lyrica [Pregabalin] Rash    BP 134/88 (BP Location: Left Arm, Patient Position: Sitting, Cuff Size: Large)   Pulse 79   Temp 97.8 F (36.6 C) (Oral)   Ht 5\' 1"  (1.549 m)   Wt 215 lb (97.5 kg)   SpO2 99%   BMI 40.62 kg/m    Review of Systems  Respiratory: Negative.   Cardiovascular: Negative.   Gastrointestinal: Positive for abdominal distention (Obese).  Genitourinary: Negative.   Musculoskeletal: Positive for arthralgias (generalized) and back pain (Chronic ).  Skin: Negative.   Neurological:  Negative.   Hematological: Negative.   Psychiatric/Behavioral: Negative.    Objective:   Physical Exam  Constitutional: She is oriented to person, place, and time. She appears well-developed and well-nourished.  Neck: Normal range of motion. Neck supple.  Cardiovascular: Normal rate, regular rhythm, normal heart sounds and intact distal pulses.  Pulmonary/Chest: Effort normal and breath sounds normal.  Abdominal: Soft.  Musculoskeletal: She exhibits edema (left foot).  Limited ROM  Neurological: She is alert and oriented to person, place, and time.  Skin: Skin is warm and dry.  Nursing note and vitals reviewed.  Assessment & Plan:   1. Essential hypertension Blood pressure is stable at 134/88 today. Refill blood pressure medications. She will continue to decrease high sodium intake, excessive alcohol intake, increase potassium intake, smoking cessation, and increase physical activity of at least 30 minutes of cardio activity daily. She will continue to follow Heart Healthy or DASH diet. - amLODipine (NORVASC) 5 MG tablet; Take 1 tablet (5 mg total) by mouth daily.  Dispense: 30 tablet; Refill: 6 - lisinopril-hydrochlorothiazide (PRINZIDE,ZESTORETIC) 20-12.5 MG tablet; Take 1 tablet by mouth daily.  Dispense: 30 tablet; Refill: 6  2. Left foot pain - Ambulatory referral to Podiatry  3. Need for immunization against influenza - Flu Vaccine QUAD 36+ mos IM  4. Screening for diabetes mellitus Hgb A1c is normal at 5.3 today.  She will continue to decrease foods/beverages high in sugars and carbs and follow Heart Healthy or DASH diet. Increase physical activity to at least 30 minutes cardio exercise daily.  - POCT glycosylated hemoglobin (Hb A1C) - POCT urinalysis dipstick  5. Chronic bilateral low back pain with bilateral sciatica Stable.  - meloxicam (MOBIC) 15 MG tablet; Take 1 tablet (15 mg total) by mouth daily.  Dispense: 30 tablet; Refill: 2  6. Seasonal allergies -  cetirizine (ZYRTEC) 10 MG tablet; Take 1 tablet (10 mg total) by mouth daily.  Dispense: 30 tablet; Refill: 11  7. Rash due to allergy - diphenhydrAMINE (BENADRYL) 2 % cream; Apply topically 3 (three) times daily as needed for itching.  Dispense: 30 g; Refill: 1  8. Screening for breast cancer - MM Digital Diagnostic Bilat; Future  9. Follow up She will follow up in 3 weeks.   Meds ordered this encounter  Medications  . amLODipine (NORVASC) 5 MG tablet    Sig: Take 1 tablet (5 mg total) by mouth daily.    Dispense:  30 tablet    Refill:  6  . lisinopril-hydrochlorothiazide (PRINZIDE,ZESTORETIC) 20-12.5 MG tablet    Sig: Take 1 tablet by mouth daily.  Dispense:  30 tablet    Refill:  6  . meloxicam (MOBIC) 15 MG tablet    Sig: Take 1 tablet (15 mg total) by mouth daily.    Dispense:  30 tablet    Refill:  2  . cetirizine (ZYRTEC) 10 MG tablet    Sig: Take 1 tablet (10 mg total) by mouth daily.    Dispense:  30 tablet    Refill:  11  . diphenhydrAMINE (BENADRYL) 2 % cream    Sig: Apply topically 3 (three) times daily as needed for itching.    Dispense:  30 g    Refill:  1     Referral Orders     Ambulatory referral to [facility],  MSN, [place] [address] [place], [place] [address] [phone]

## 2018-08-04 ENCOUNTER — Ambulatory Visit: Payer: Medicare HMO | Admitting: Family Medicine

## 2018-08-10 ENCOUNTER — Other Ambulatory Visit: Payer: Self-pay | Admitting: Family Medicine

## 2018-08-10 DIAGNOSIS — Z1239 Encounter for other screening for malignant neoplasm of breast: Secondary | ICD-10-CM

## 2018-08-15 ENCOUNTER — Ambulatory Visit (INDEPENDENT_AMBULATORY_CARE_PROVIDER_SITE_OTHER): Payer: Medicare HMO | Admitting: Podiatry

## 2018-08-15 ENCOUNTER — Ambulatory Visit (INDEPENDENT_AMBULATORY_CARE_PROVIDER_SITE_OTHER): Payer: Medicare HMO

## 2018-08-15 ENCOUNTER — Encounter: Payer: Self-pay | Admitting: Podiatry

## 2018-08-15 DIAGNOSIS — G8929 Other chronic pain: Secondary | ICD-10-CM

## 2018-08-15 DIAGNOSIS — M79675 Pain in left toe(s): Secondary | ICD-10-CM

## 2018-08-15 DIAGNOSIS — M2142 Flat foot [pes planus] (acquired), left foot: Secondary | ICD-10-CM

## 2018-08-15 DIAGNOSIS — M779 Enthesopathy, unspecified: Secondary | ICD-10-CM | POA: Diagnosis not present

## 2018-08-15 DIAGNOSIS — M205X2 Other deformities of toe(s) (acquired), left foot: Secondary | ICD-10-CM

## 2018-08-15 DIAGNOSIS — M79672 Pain in left foot: Secondary | ICD-10-CM

## 2018-08-15 DIAGNOSIS — M2141 Flat foot [pes planus] (acquired), right foot: Secondary | ICD-10-CM

## 2018-08-15 MED ORDER — DICLOFENAC SODIUM 1 % TD GEL: 2.0000 g | Freq: Four times a day (QID) | TRANSDERMAL | 2 refills | Status: DC

## 2018-08-15 MED FILL — DICLOFENAC SODIUM 1% GEL: 1 | 12 days supply | Qty: 100 | Fill #0

## 2018-08-15 NOTE — Progress Notes (Signed)
Subjective: 57 year old female presents the office today for concerns of pain to her left foot.  She also gets a callus in the left foot which becomes painful.  She had a bunion and hammertoe surgery performed while in [place].  She did not undergo physical therapy due to cost.  She has previously been under the care of Dr. Cannon Kettle as well as Dr. Amalia Hailey. She did get permanent disability from her primary care provider. Denies any systemic complaints such as fevers, chills, nausea, vomiting. No acute changes since last appointment, and no other complaints at this time.   Objective: AAO x3, NAD DP/PT pulses palpable bilaterally, CRT less than 3 seconds Decreased range of motion of the first MPJ on the left foot.  Mild tenderness with first MPJ range of motion.  Mild callus to left big toe present.  Upon debridement there is no ongoing ulceration, drainage or any signs of infection present today.  There is no other areas of tenderness. No open lesions or pre-ulcerative lesions.  No pain with calf compression, swelling, warmth, erythema  Assessment: Left foot hallux limitus, capsulitis with hyperkeratotic  Plan: -All treatment options discussed with the patient including all alternatives, risks, complications.  -X-rays were obtained reviewed.  Arthritic changes present the first MPJ.  No evidence of acute fracture.  Skin marker of the left identified area of skin lesion. -Recommend any further surgical intervention she agrees with this.  Discussed wearing a stiffer soled shoe.  We also discussed a custom orthotic with a Morton's extension.  Declined steroid injection to consider this in the future.  Debrided the hyperkeratotic lesion without any complications or bleeding.  Moisturizer daily. -Voltaren gel.  -Patient encouraged to call the office with any questions, concerns, change in symptoms.   Trula Slade DPM

## 2018-08-28 MED FILL — OMEPRAZOLE DR 40 MG CAPSULE: 40 | 30 days supply | Qty: 30 | Fill #1

## 2018-08-28 MED FILL — AMLODIPINE BESYLATE 5 MG TA: 5 | 30 days supply | Qty: 30 | Fill #1

## 2018-08-28 MED FILL — LISINOPRIL-HCTZ 20-12.5 MG: 20-12.5 | 30 days supply | Qty: 30 | Fill #0

## 2018-09-04 ENCOUNTER — Telehealth: Payer: Self-pay | Admitting: Podiatry

## 2018-09-04 NOTE — Telephone Encounter (Signed)
Pt left message stating she was looking into shoes and inserts previously but not covered by insurance. She now has a new insurance and is asking for me to call to discuss.  I returned call and pt now has [employer] and medicaid and I asked if she was a diabetic and she said no. I explained that the shoes and inserts are only covered with her insurance if pt is a diabetic.

## 2018-09-07 DIAGNOSIS — H1013 Acute atopic conjunctivitis, bilateral: Secondary | ICD-10-CM | POA: Diagnosis not present

## 2018-09-07 DIAGNOSIS — H11133 Conjunctival pigmentations, bilateral: Secondary | ICD-10-CM | POA: Diagnosis not present

## 2018-09-07 DIAGNOSIS — H40013 Open angle with borderline findings, low risk, bilateral: Secondary | ICD-10-CM | POA: Diagnosis not present

## 2018-09-07 MED FILL — OLOPATADINE HCL 0.2% EYE DR: 0.2 | 20 days supply | Qty: 3 | Fill #0

## 2018-09-20 ENCOUNTER — Ambulatory Visit
Admission: RE | Admit: 2018-09-20 | Discharge: 2018-09-20 | Disposition: A | Discharge status: Home / Self Care | Payer: Medicare HMO | Source: Ambulatory Visit | Attending: Family Medicine | Admitting: Family Medicine

## 2018-09-20 DIAGNOSIS — Z1239 Encounter for other screening for malignant neoplasm of breast: Secondary | ICD-10-CM

## 2018-09-20 DIAGNOSIS — Z1231 Encounter for screening mammogram for malignant neoplasm of breast: Secondary | ICD-10-CM | POA: Diagnosis not present

## 2018-09-22 ENCOUNTER — Telehealth: Payer: Self-pay

## 2018-09-22 NOTE — Telephone Encounter (Signed)
-----   Message from Azzie Glatter, [place] sent at 09/21/2018 10:12 PM EST ----- Regarding: "Mammogram" Negative results, no evidence of malignancy.   Thank you.

## 2018-09-22 NOTE — Telephone Encounter (Signed)
Patient notified of results.

## 2018-09-25 ENCOUNTER — Other Ambulatory Visit: Payer: Self-pay | Admitting: Internal Medicine

## 2018-09-25 MED FILL — OLOPATADINE HCL 0.2% EYE DR: 0.2 | 20 days supply | Qty: 3 | Fill #1

## 2018-09-25 MED FILL — LISINOPRIL-HCTZ 20-12.5 MG: 20-12.5 | 30 days supply | Qty: 30 | Fill #1

## 2018-09-25 MED FILL — AMLODIPINE BESYLATE 5 MG TA: 5 | 30 days supply | Qty: 30 | Fill #2

## 2018-09-26 ENCOUNTER — Other Ambulatory Visit: Payer: Self-pay

## 2018-09-26 DIAGNOSIS — I10 Essential (primary) hypertension: Secondary | ICD-10-CM

## 2018-09-26 MED ORDER — LISINOPRIL-HYDROCHLOROTHIAZIDE 20-12.5 MG PO TABS: 1.0000 | ORAL_TABLET | Freq: Every day | ORAL | 2 refills | Status: DC

## 2018-09-26 MED ORDER — AMLODIPINE BESYLATE 5 MG PO TABS: 5.0000 mg | ORAL_TABLET | Freq: Every day | ORAL | 2 refills | Status: DC

## 2018-09-26 MED ORDER — OMEPRAZOLE 40 MG PO CPDR: 40.0000 mg | DELAYED_RELEASE_CAPSULE | Freq: Every day | ORAL | 3 refills | Status: DC

## 2018-09-26 NOTE — Telephone Encounter (Signed)
Medication sent to pharmacy  

## 2018-09-27 MED FILL — OMEPRAZOLE DR 40 MG CAPSULE: 40 | 30 days supply | Qty: 30 | Fill #0

## 2018-10-26 MED FILL — LISINOPRIL-HCTZ 20-12.5 MG: 20-12.5 | 30 days supply | Qty: 30 | Fill #0

## 2018-10-26 MED FILL — OMEPRAZOLE DR 40 MG CAPSULE: 40 | 30 days supply | Qty: 30 | Fill #1

## 2018-10-26 MED FILL — AMLODIPINE BESYLATE 5 MG TA: 5 | 30 days supply | Qty: 30 | Fill #0

## 2018-11-06 ENCOUNTER — Encounter: Payer: Self-pay | Admitting: Family Medicine

## 2018-11-06 ENCOUNTER — Ambulatory Visit (INDEPENDENT_AMBULATORY_CARE_PROVIDER_SITE_OTHER): Payer: Medicare HMO | Admitting: Family Medicine

## 2018-11-06 VITALS — BP 130/68 | HR 84 | Temp 97.5°F | Ht 61.0 in | Wt 214.0 lb

## 2018-11-06 DIAGNOSIS — E66813 Obesity, class 3: Secondary | ICD-10-CM

## 2018-11-06 DIAGNOSIS — G629 Polyneuropathy, unspecified: Secondary | ICD-10-CM | POA: Diagnosis not present

## 2018-11-06 DIAGNOSIS — Z09 Encounter for follow-up examination after completed treatment for conditions other than malignant neoplasm: Secondary | ICD-10-CM | POA: Diagnosis not present

## 2018-11-06 DIAGNOSIS — Z6841 Body Mass Index (BMI) 40.0 and over, adult: Secondary | ICD-10-CM

## 2018-11-06 DIAGNOSIS — M79672 Pain in left foot: Secondary | ICD-10-CM | POA: Diagnosis not present

## 2018-11-06 DIAGNOSIS — N951 Menopausal and female climacteric states: Secondary | ICD-10-CM

## 2018-11-06 DIAGNOSIS — I10 Essential (primary) hypertension: Secondary | ICD-10-CM

## 2018-11-06 LAB — POCT URINALYSIS DIP (MANUAL ENTRY)
Bilirubin, UA: NEGATIVE
Blood, UA: NEGATIVE
Glucose, UA: NEGATIVE mg/dL
Ketones, POC UA: NEGATIVE mg/dL
Leukocytes, UA: NEGATIVE
Nitrite, UA: NEGATIVE
Protein Ur, POC: NEGATIVE mg/dL
Spec Grav, UA: 1.01 (ref 1.010–1.025)
Urobilinogen, UA: 0.2 E.U./dL
pH, UA: 5.5 (ref 5.0–8.0)

## 2018-11-06 MED ORDER — GABAPENTIN 300 MG PO CAPS: 300.0000 mg | ORAL_CAPSULE | Freq: Every day | ORAL | 3 refills | Status: DC

## 2018-11-06 MED FILL — GABAPENTIN 300 MG CAPSULE: 300 | 30 days supply | Qty: 30 | Fill #0

## 2018-11-06 NOTE — Progress Notes (Signed)
Follow Up  Subjective:    Patient ID: Rashan Patient, female    DOB: 05/19/1961, 57 y.o.   MRN: [id number]  Chief Complaint  Patient presents with  . Follow-up    chronic condition    HPI  Ms. Boghosian is a 57 year old female with a past medical history of Stoke and Hypertension. She is here today for follow up.   Current Status: Since her last office visit, she is doing well and has c/o of increasing hot flashes. She has had a couple of falls since her last visit and had mild pain in both knees for a while. Pain has resolved. She denies visual changes, chest pain, cough, shortness of breath, she heart palpitations. She has occasionally headaches and dizziness with position changes. Denies severe headaches, confusion, seizures, double vision, and blurred vision, nausea and vomiting. She has appointment for Optometry in a few months.   She denies fevers, chills, fatigue, recent infections, weight loss, and night sweats. No reports of GI problems such as diarrhea, and constipation. She has no reports of blood in stools, dysuria and hematuria. No depression or anxiety report. She reports generalized pain.    Review of Systems  Constitutional: Negative.   HENT: Negative.   Eyes: Positive for visual disturbance (mild changes).  Respiratory: Negative.   Cardiovascular: Negative.   Gastrointestinal: Positive for abdominal distention (Obese).  Endocrine: Negative.   Musculoskeletal: Positive for arthralgias (Generalized).  Skin: Negative.   Allergic/Immunologic: Negative.   Neurological: Positive for dizziness and headaches.       Hot Flashes  Hematological: Negative.   Psychiatric/Behavioral: Negative.     Objective:   Physical Exam Vitals signs and nursing note reviewed.  Constitutional:      Appearance: Normal appearance. She is obese.  HENT:     Head: Normocephalic and atraumatic.     Right Ear: External ear normal.     Left Ear: External ear normal.     Nose: Nose normal.      Mouth/Throat:     Mouth: Mucous membranes are moist.     Pharynx: Oropharynx is clear.  Neck:     Musculoskeletal: Normal range of motion and neck supple.  Cardiovascular:     Rate and Rhythm: Normal rate and regular rhythm.  Pulmonary:     Effort: Pulmonary effort is normal.     Breath sounds: Normal breath sounds.  Abdominal:     General: Bowel sounds are normal. There is distension.     Palpations: Abdomen is soft.  Neurological:     Mental Status: She is alert.    Assessment & Plan:    1. Essential hypertension Antihypertensive medications are effective. Blood pressure is 130/68 today. She will continue Amlodipine and Lisinopril-HCTZ as prescribed. She will continue to decrease high sodium intake, excessive alcohol intake, increase potassium intake, smoking cessation, and increase physical activity of at least 30 minutes of cardio activity daily. She will continue to follow Heart Healthy or DASH diet.  2. Hot flashes due to menopause Moderate. Monitor.  - gabapentin (NEURONTIN) 300 MG capsule; Take 1 capsule (300 mg total) by mouth at bedtime. As needed.  Dispense: 30 capsule; Refill: 3  3. Neuropathy - gabapentin (NEURONTIN) 300 MG capsule; Take 1 capsule (300 mg total) by mouth at bedtime. As needed.  Dispense: 30 capsule; Refill: 3  4. Class 3 severe obesity due to excess calories with serious comorbidity and body mass index (BMI) of 40.0 to 44.9 in adult [facility]) Body mass index  is 40.43 kg/m. Goal BMI  is <30. Encouraged efforts to reduce weight include engaging in physical activity as tolerated with goal of 150 minutes per week. Improve dietary choices and eat a meal regimen consistent with a Mediterranean or DASH diet. Reduce simple carbohydrates. Do not skip meals and eat healthy snacks throughout the day to avoid over-eating at dinner. Set a goal weight loss that is achievable for you.  5. Left foot pain Stable. She will continue to follow up with Podiatrist a needed.    6. Follow up She will follow up in 3 months. We will get labs at next office appointment. - POCT urinalysis dipstick   Meds ordered this encounter  Medications  . gabapentin (NEURONTIN) 300 MG capsule    Sig: Take 1 capsule (300 mg total) by mouth at bedtime. As needed.    Dispense:  30 capsule    Refill:  [place],  MSN, FNP-C [facility] [address] [place], [place] [address] [phone]

## 2018-11-06 NOTE — Patient Instructions (Addendum)
Gabapentin capsules or tablets What is this medicine? GABAPENTIN (GA ba pen tin) is used to control seizures in certain types of epilepsy. It is also used to treat certain types of nerve pain. This medicine may be used for other purposes; ask your health care provider or pharmacist if you have questions. COMMON BRAND NAME(S): Active-PAC with Gabapentin, Gabarone, Neurontin What should I tell my health care provider before I take this medicine? They need to know if you have any of these conditions: -kidney disease -suicidal thoughts, plans, or attempt; a previous suicide attempt by you or a family member -an unusual or allergic reaction to gabapentin, other medicines, foods, dyes, or preservatives -pregnant or trying to get pregnant -breast-feeding How should I use this medicine? Take this medicine by mouth with a glass of water. Follow the directions on the prescription label. You can take it with or without food. If it upsets your stomach, take it with food. Take your medicine at regular intervals. Do not take it more often than directed. Do not stop taking except on your doctor's advice. If you are directed to break the 600 or 800 mg tablets in half as part of your dose, the extra half tablet should be used for the next dose. If you have not used the extra half tablet within 28 days, it should be thrown away. A special MedGuide will be given to you by the pharmacist with each prescription and refill. Be sure to read this information carefully each time. Talk to your pediatrician regarding the use of this medicine in children. While this drug may be prescribed for children as young as 3 years for selected conditions, precautions do apply. Overdosage: If you think you have taken too much of this medicine contact a poison control center or emergency room at once. NOTE: This medicine is only for you. Do not share this medicine with others. What if I miss a dose? If you miss a dose, take it as soon  as you can. If it is almost time for your next dose, take only that dose. Do not take double or extra doses. What may interact with this medicine? Do not take this medicine with any of the following medications: -other gabapentin products This medicine may also interact with the following medications: -alcohol -antacids -antihistamines for allergy, cough and cold -certain medicines for anxiety or sleep -certain medicines for depression or psychotic disturbances -homatropine; hydrocodone -naproxen -narcotic medicines (opiates) for pain -phenothiazines like chlorpromazine, mesoridazine, prochlorperazine, thioridazine This list may not describe all possible interactions. Give your health care provider a list of all the medicines, herbs, non-prescription drugs, or dietary supplements you use. Also tell them if you smoke, drink alcohol, or use illegal drugs. Some items may interact with your medicine. What should I watch for while using this medicine? Visit your doctor or health care professional for regular checks on your progress. You may want to keep a record at home of how you feel your condition is responding to treatment. You may want to share this information with your doctor or health care professional at each visit. You should contact your doctor or health care professional if your seizures get worse or if you have any new types of seizures. Do not stop taking this medicine or any of your seizure medicines unless instructed by your doctor or health care professional. Stopping your medicine suddenly can increase your seizures or their severity. Wear a medical identification bracelet or chain if you are taking this medicine for   seizures, and carry a card that lists all your medications. You may get drowsy, dizzy, or have blurred vision. Do not drive, use machinery, or do anything that needs mental alertness until you know how this medicine affects you. To reduce dizzy or fainting spells, do not  sit or stand up quickly, especially if you are an older patient. Alcohol can increase drowsiness and dizziness. Avoid alcoholic drinks. Your mouth may get dry. Chewing sugarless gum or sucking hard candy, and drinking plenty of water will help. The use of this medicine may increase the chance of suicidal thoughts or actions. Pay special attention to how you are responding while on this medicine. Any worsening of mood, or thoughts of suicide or dying should be reported to your health care professional right away. Women who become pregnant while using this medicine may enroll in the [place] Pregnancy Registry by calling [phone]. This registry collects information about the safety of antiepileptic drug use during pregnancy. What side effects may I notice from receiving this medicine? Side effects that you should report to your doctor or health care professional as soon as possible: -allergic reactions like skin rash, itching or hives, swelling of the face, lips, or tongue -worsening of mood, thoughts or actions of suicide or dying Side effects that usually do not require medical attention (report to your doctor or health care professional if they continue or are bothersome): -constipation -difficulty walking or controlling muscle movements -dizziness -nausea -slurred speech -tiredness -tremors -weight gain This list may not describe all possible side effects. Call your doctor for medical advice about side effects. You may report side effects to FDA at 1-800-FDA-1088. Where should I keep my medicine? Keep out of reach of children. This medicine may cause accidental overdose and death if it taken by other adults, children, or pets. Mix any unused medicine with a substance like cat litter or coffee grounds. Then throw the medicine away in a sealed container like a sealed bag or a coffee can with a lid. Do not use the medicine after the expiration date. Store at room  temperature between 15 and 30 degrees C (59 and 86 degrees F). NOTE: This sheet is a summary. It may not cover all possible information. If you have questions about this medicine, talk to your doctor, pharmacist, or health care provider.  2019 Elsevier/Gold Standard (2018-03-30 13:21:44) Menopause Menopause is the normal time of life when menstrual periods stop completely. It is usually confirmed by 12 months without a menstrual period. The transition to menopause (perimenopause) most often happens between the ages of 5 and 10. During perimenopause, hormone levels change in your body, which can cause symptoms and affect your health. Menopause may increase your risk for:  Loss of bone (osteoporosis), which causes bone breaks (fractures).  Depression.  Hardening and narrowing of the arteries (atherosclerosis), which can cause heart attacks and strokes. What are the causes? This condition is usually caused by a natural change in hormone levels that happens as you get older. The condition may also be caused by surgery to remove both ovaries (bilateral oophorectomy). What increases the risk? This condition is more likely to start at an earlier age if you have certain medical conditions or treatments, including:  A tumor of the pituitary gland in the brain.  A disease that affects the ovaries and hormone production.  Radiation treatment for cancer.  Certain cancer treatments, such as chemotherapy or hormone (anti-estrogen) therapy.  Heavy smoking and excessive alcohol use.  Family  history of early menopause. This condition is also more likely to develop earlier in women who are very thin. What are the signs or symptoms? Symptoms of this condition include:  Hot flashes.  Irregular menstrual periods.  Night sweats.  Changes in feelings about sex. This could be a decrease in sex drive or an increased comfort around your sexuality.  Vaginal dryness and thinning of the vaginal walls.  This may cause painful intercourse.  Dryness of the skin and development of wrinkles.  Headaches.  Problems sleeping (insomnia).  Mood swings or irritability.  Memory problems.  Weight gain.  Hair growth on the face and chest.  Bladder infections or problems with urinating. How is this diagnosed? This condition is diagnosed based on your medical history, a physical exam, your age, your menstrual history, and your symptoms. Hormone tests may also be done. How is this treated? In some cases, no treatment is needed. You and your health care provider should make a decision together about whether treatment is necessary. Treatment will be based on your individual condition and preferences. Treatment for this condition focuses on managing symptoms. Treatment may include:  Menopausal hormone therapy (MHT).  Medicines to treat specific symptoms or complications.  Acupuncture.  Vitamin or herbal supplements. Before starting treatment, make sure to let your health care provider know if you have a personal or family history of:  Heart disease.  Breast cancer.  Blood clots.  Diabetes.  Osteoporosis. Follow these instructions at home: Lifestyle  Do not use any products that contain nicotine or tobacco, such as cigarettes and e-cigarettes. If you need help quitting, ask your health care provider.  Get at least 30 minutes of physical activity on 5 or more days each week.  Avoid alcoholic and caffeinated beverages, as well as spicy foods. This may help prevent hot flashes.  Get 7-8 hours of sleep each night.  If you have hot flashes, try: ? Dressing in layers. ? Avoiding things that may trigger hot flashes, such as spicy food, warm places, or stress. ? Taking slow, deep breaths when a hot flash starts. ? Keeping a fan in your home and office.  Find ways to manage stress, such as deep breathing, meditation, or journaling.  Consider going to group therapy with other women who  are having menopause symptoms. Ask your health care provider about recommended group therapy meetings. Eating and drinking  Eat a healthy, balanced diet that contains whole grains, lean protein, low-fat dairy, and plenty of fruits and vegetables.  Your health care provider may recommend adding more soy to your diet. Foods that contain soy include tofu, tempeh, and soy milk.  Eat plenty of foods that contain calcium and vitamin D for bone health. Items that are rich in calcium include low-fat milk, yogurt, beans, almonds, sardines, broccoli, and kale. Medicines  Take over-the-counter and prescription medicines only as told by your health care provider.  Talk with your health care provider before starting any herbal supplements. If prescribed, take vitamins and supplements as told by your health care provider. These may include: ? Calcium. Women age 59 and older should get 1,200 mg (milligrams) of calcium every day. ? Vitamin D. Women need 600-800 International Units of vitamin D each day. ? Vitamins B12 and B6. Aim for 50 micrograms of B12 and 1.5 mg of B6 each day. General instructions  Keep track of your menstrual periods, including: ? When they occur. ? How heavy they are and how long they last. ? How much time  passes between periods.  Keep track of your symptoms, noting when they start, how often you have them, and how long they last.  Use vaginal lubricants or moisturizers to help with vaginal dryness and improve comfort during sex.  Keep all follow-up visits as told by your health care provider. This is important. This includes any group therapy or counseling. Contact a health care provider if:  You are still having menstrual periods after age 77.  You have pain during sex.  You have not had a period for 12 months and you develop vaginal bleeding. Get help right away if:  You have: ? Severe depression. ? Excessive vaginal bleeding. ? Pain when you urinate. ? A fast or  irregular heart beat (palpitations). ? Severe headaches. ? Abdomen (abdominal) pain or severe indigestion.  You fell and you think you have a broken bone.  You develop leg or chest pain.  You develop vision problems.  You feel a lump in your breast. Summary  Menopause is the normal time of life when menstrual periods stop completely. It is usually confirmed by 12 months without a menstrual period.  The transition to menopause (perimenopause) most often happens between the ages of 34 and 42.  Symptoms can be managed through medicines, lifestyle changes, and complementary therapies such as acupuncture.  Eat a balanced diet that is rich in nutrients to promote bone health and heart health and to manage symptoms during menopause. This information is not intended to replace advice given to you by your health care provider. Make sure you discuss any questions you have with your health care provider. Document Released: 01/15/2004 Document Revised: 11/27/2016 Document Reviewed: 11/27/2016 Elsevier Interactive Patient Education  2019 [employer]. Neuropathic Pain Neuropathic pain is pain caused by damage to the nerves that are responsible for certain sensations in your body (sensory nerves). The pain can be caused by:  Damage to the sensory nerves that send signals to your spinal cord and brain (peripheral nervous system).  Damage to the sensory nerves in your brain or spinal cord (central nervous system). Neuropathic pain can make you more sensitive to pain. Even a minor sensation can feel very painful. This is usually a long-term condition that can be difficult to treat. The type of pain differs from person to person. It may:  Start suddenly (acute), or it may develop slowly and last for a long time (chronic).  Come and go as damaged nerves heal, or it may stay at the same level for years.  Cause emotional distress, loss of sleep, and a lower quality of life. What are the causes? The  most common cause of this condition is diabetes. Many other diseases and conditions can also cause neuropathic pain. Causes of neuropathic pain can be classified as:  Toxic. This is caused by medicines and chemicals. The most common cause of toxic neuropathic pain is damage from cancer treatments (chemotherapy).  Metabolic. This can be caused by: ? Diabetes. This is the most common disease that damages the nerves. ? Lack of vitamin B from long-term alcohol abuse.  Traumatic. Any injury that cuts, crushes, or stretches a nerve can cause damage and pain. A common example is feeling pain after losing an arm or leg (phantom limb pain).  Compression-related. If a sensory nerve gets trapped or compressed for a long period of time, the blood supply to the nerve can be cut off.  Vascular. Many blood vessel diseases can cause neuropathic pain by decreasing blood supply and oxygen to nerves.  Autoimmune. This type of pain results from diseases in which the body's defense system (immune system) mistakenly attacks sensory nerves. Examples of autoimmune diseases that can cause neuropathic pain include lupus and multiple sclerosis.  Infectious. Many types of viral infections can damage sensory nerves and cause pain. Shingles infection is a common cause of this type of pain.  Inherited. Neuropathic pain can be a symptom of many diseases that are passed down through families (genetic). What increases the risk? You are more likely to develop this condition if:  You have diabetes.  You smoke.  You drink too much alcohol.  You are taking certain medicines, including medicines that kill cancer cells (chemotherapy) or that treat immune system disorders. What are the signs or symptoms? The main symptom is pain. Neuropathic pain is often described as:  Burning.  Shock-like.  Stinging.  Hot or cold.  Itching. How is this diagnosed? No single test can diagnose neuropathic pain. It is diagnosed based  on:  Physical exam and your symptoms. Your health care provider will ask you about your pain. You may be asked to use a pain scale to describe how bad your pain is.  Tests. These may be done to see if you have a high sensitivity to pain and to help find the cause and location of any sensory nerve damage. They include: ? Nerve conduction studies to test how well nerve signals travel through your sensory nerves (electrodiagnostic testing). ? Stimulating your sensory nerves through electrodes on your skin and measuring the response in your spinal cord and brain (somatosensory evoked potential).  Imaging studies, such as: ? X-rays. ? CT scan. ? MRI. How is this treated? Treatment for neuropathic pain may change over time. You may need to try different treatment options or a combination of treatments. Some options include:  Treating the underlying cause of the neuropathy, such as diabetes, kidney disease, or vitamin deficiencies.  Stopping medicines that can cause neuropathy, such as chemotherapy.  Medicine to relieve pain. Medicines may include: ? Prescription or over-the-counter pain medicine. ? Anti-seizure medicine. ? Antidepressant medicines. ? Pain-relieving patches that are applied to painful areas of skin. ? A medicine to numb the area (local anesthetic), which can be injected as a nerve block.  Transcutaneous nerve stimulation. This uses electrical currents to block painful nerve signals. The treatment is painless.  Alternative treatments, such as: ? Acupuncture. ? Meditation. ? Massage. ? Physical therapy. ? Pain management programs. ? Counseling. Follow these instructions at home: Medicines   Take over-the-counter and prescription medicines only as told by your health care provider.  Do not drive or use heavy machinery while taking prescription pain medicine.  If you are taking prescription pain medicine, take actions to prevent or treat constipation. Your health care  provider may recommend that you: ? Drink enough fluid to keep your urine pale yellow. ? Eat foods that are high in fiber, such as fresh fruits and vegetables, whole grains, and beans. ? Limit foods that are high in fat and processed sugars, such as fried or sweet foods. ? Take an over-the-counter or prescription medicine for constipation. Lifestyle   Have a good support system at home.  Consider joining a chronic pain support group.  Do not use any products that contain nicotine or tobacco, such as cigarettes and e-cigarettes. If you need help quitting, ask your health care provider.  Do not drink alcohol. General instructions  Learn as much as you can about your condition.  Work closely with all  your health care providers to find the treatment plan that works best for you.  Ask your health care provider what activities are safe for you.  Keep all follow-up visits as told by your health care provider. This is important. Contact a health care provider if:  Your pain treatments are not working.  You are having side effects from your medicines.  You are struggling with tiredness (fatigue), mood changes, depression, or anxiety. Summary  Neuropathic pain is pain caused by damage to the nerves that are responsible for certain sensations in your body (sensory nerves).  Neuropathic pain may come and go as damaged nerves heal, or it may stay at the same level for years.  Neuropathic pain is usually a long-term condition that can be difficult to treat. Consider joining a chronic pain support group. This information is not intended to replace advice given to you by your health care provider. Make sure you discuss any questions you have with your health care provider. Document Released: 07/22/2004 Document Revised: 11/11/2017 Document Reviewed: 11/11/2017 Elsevier Interactive Patient Education  2019 [place].      Heart-Healthy Eating Plan Many factors influence your heart  (coronary) health, including eating and exercise habits. Coronary risk increases with abnormal blood fat (lipid) levels. Heart-healthy meal planning includes limiting unhealthy fats, increasing healthy fats, and making other diet and lifestyle changes. What is my plan? Your health care provider may recommend that you:  Limit your fat intake to _________% or less of your total calories each day.  Limit your saturated fat intake to _________% or less of your total calories each day.  Limit the amount of cholesterol in your diet to less than _________ mg per day. What are tips for following this plan? Cooking Cook foods using methods other than frying. Baking, boiling, grilling, and broiling are all good options. Other ways to reduce fat include:  Removing the skin from poultry.  Removing all visible fats from meats.  Steaming vegetables in water or broth. Meal planning   At meals, imagine dividing your plate into fourths: ? Fill one-half of your plate with vegetables and green salads. ? Fill one-fourth of your plate with whole grains. ? Fill one-fourth of your plate with lean protein foods.  Eat 4-5 servings of vegetables per day. One serving equals 1 cup raw or cooked vegetable, or 2 cups raw leafy greens.  Eat 4-5 servings of fruit per day. One serving equals 1 medium whole fruit,  cup dried fruit,  cup fresh, frozen, or canned fruit, or  cup 100% fruit juice.  Eat more foods that contain soluble fiber. Examples include apples, broccoli, carrots, beans, peas, and barley. Aim to get 25-30 g of fiber per day.  Increase your consumption of legumes, nuts, and seeds to 4-5 servings per week. One serving of dried beans or legumes equals  cup cooked, 1 serving of nuts is  cup, and 1 serving of seeds equals 1 tablespoon. Fats  Choose healthy fats more often. Choose monounsaturated and polyunsaturated fats, such as olive and canola oils, flaxseeds, walnuts, almonds, and seeds.  Eat  more omega-3 fats. Choose salmon, mackerel, sardines, tuna, flaxseed oil, and ground flaxseeds. Aim to eat fish at least 2 times each week.  Check food labels carefully to identify foods with trans fats or high amounts of saturated fat.  Limit saturated fats. These are found in animal products, such as meats, butter, and cream. Plant sources of saturated fats include palm oil, palm kernel oil, and coconut  oil.  Avoid foods with partially hydrogenated oils in them. These contain trans fats. Examples are stick margarine, some tub margarines, cookies, crackers, and other baked goods.  Avoid fried foods. General information  Eat more home-cooked food and less restaurant, buffet, and fast food.  Limit or avoid alcohol.  Limit foods that are high in starch and sugar.  Lose weight if you are overweight. Losing just 5-10% of your body weight can help your overall health and prevent diseases such as diabetes and heart disease.  Monitor your salt (sodium) intake, especially if you have high blood pressure. Talk with your health care provider about your sodium intake.  Try to incorporate more vegetarian meals weekly. What foods can I eat? Fruits All fresh, canned (in natural juice), or frozen fruits. Vegetables Fresh or frozen vegetables (raw, steamed, roasted, or grilled). Green salads. Grains Most grains. Choose whole wheat and whole grains most of the time. Rice and pasta, including brown rice and pastas made with whole wheat. Meats and other proteins Lean, well-trimmed beef, veal, pork, and lamb. Chicken and [place] without skin. All fish and shellfish. Wild duck, rabbit, pheasant, and venison. Egg whites or low-cholesterol egg substitutes. Dried beans, peas, lentils, and tofu. Seeds and most nuts. Dairy Low-fat or nonfat cheeses, including ricotta and mozzarella. Skim or 1% milk (liquid, powdered, or evaporated). Buttermilk made with low-fat milk. Nonfat or low-fat yogurt. Fats and  oils Non-hydrogenated (trans-free) margarines. Vegetable oils, including soybean, sesame, sunflower, olive, peanut, safflower, corn, canola, and cottonseed. Salad dressings or mayonnaise made with a vegetable oil. Beverages Water (mineral or sparkling). Coffee and tea. Diet carbonated beverages. Sweets and desserts Sherbet, gelatin, and fruit ice. Small amounts of dark chocolate. Limit all sweets and desserts. Seasonings and condiments All seasonings and condiments. The items listed above may not be a complete list of foods and beverages you can eat. Contact a dietitian for more options. What foods are not recommended? Fruits Canned fruit in heavy syrup. Fruit in cream or butter sauce. Fried fruit. Limit coconut. Vegetables Vegetables cooked in cheese, cream, or butter sauce. Fried vegetables. Grains Breads made with saturated or trans fats, oils, or whole milk. Croissants. Sweet rolls. Donuts. High-fat crackers, such as cheese crackers. Meats and other proteins Fatty meats, such as hot dogs, ribs, sausage, bacon, rib-eye roast or steak. High-fat deli meats, such as salami and bologna. Caviar. Domestic duck and goose. Organ meats, such as liver. Dairy Cream, sour cream, cream cheese, and creamed cottage cheese. Whole milk cheeses. Whole or 2% milk (liquid, evaporated, or condensed). Whole buttermilk. Cream sauce or high-fat cheese sauce. Whole-milk yogurt. Fats and oils Meat fat, or shortening. Cocoa butter, hydrogenated oils, palm oil, coconut oil, palm kernel oil. Solid fats and shortenings, including bacon fat, salt pork, lard, and butter. Nondairy cream substitutes. Salad dressings with cheese or sour cream. Beverages Regular sodas and any drinks with added sugar. Sweets and desserts Frosting. Pudding. Cookies. Cakes. Pies. Milk chocolate or white chocolate. Buttered syrups. Full-fat ice cream or ice cream drinks. The items listed above may not be a complete list of foods and beverages  to avoid. Contact a dietitian for more information. Summary  Heart-healthy meal planning includes limiting unhealthy fats, increasing healthy fats, and making other diet and lifestyle changes.  Lose weight if you are overweight. Losing just 5-10% of your body weight can help your overall health and prevent diseases such as diabetes and heart disease.  Focus on eating a balance of foods, including fruits and vegetables,  low-fat or nonfat dairy, lean protein, nuts and legumes, whole grains, and heart-healthy oils and fats. This information is not intended to replace advice given to you by your health care provider. Make sure you discuss any questions you have with your health care provider. Document Released: 08/03/2008 Document Revised: 12/02/2017 Document Reviewed: 12/02/2017 Elsevier Interactive Patient Education  2019 [place] DASH stands for "Dietary Approaches to Stop Hypertension." The DASH eating plan is a healthy eating plan that has been shown to reduce high blood pressure (hypertension). It may also reduce your risk for type 2 diabetes, heart disease, and stroke. The DASH eating plan may also help with weight loss. What are tips for following this plan?  General guidelines  Avoid eating more than 2,300 mg (milligrams) of salt (sodium) a day. If you have hypertension, you may need to reduce your sodium intake to 1,500 mg a day.  Limit alcohol intake to no more than 1 drink a day for nonpregnant women and 2 drinks a day for men. One drink equals 12 oz of beer, 5 oz of wine, or 1 oz of hard liquor.  Work with your health care provider to maintain a healthy body weight or to lose weight. Ask what an ideal weight is for you.  Get at least 30 minutes of exercise that causes your heart to beat faster (aerobic exercise) most days of the week. Activities may include walking, swimming, or biking.  Work with your health care provider or diet and nutrition specialist  (dietitian) to adjust your eating plan to your individual calorie needs. Reading food labels   Check food labels for the amount of sodium per serving. Choose foods with less than 5 percent of the Daily Value of sodium. Generally, foods with less than 300 mg of sodium per serving fit into this eating plan.  To find whole grains, look for the word "whole" as the first word in the ingredient list. Shopping  Buy products labeled as "low-sodium" or "no salt added."  Buy fresh foods. Avoid canned foods and premade or frozen meals. Cooking  Avoid adding salt when cooking. Use salt-free seasonings or herbs instead of table salt or sea salt. Check with your health care provider or pharmacist before using salt substitutes.  Do not fry foods. Cook foods using healthy methods such as baking, boiling, grilling, and broiling instead.  Cook with heart-healthy oils, such as olive, canola, soybean, or sunflower oil. Meal planning  Eat a balanced diet that includes: ? 5 or more servings of fruits and vegetables each day. At each meal, try to fill half of your plate with fruits and vegetables. ? Up to 6-8 servings of whole grains each day. ? Less than 6 oz of lean meat, poultry, or fish each day. A 3-oz serving of meat is about the same size as a deck of cards. One egg equals 1 oz. ? 2 servings of low-fat dairy each day. ? A serving of nuts, seeds, or beans 5 times each week. ? Heart-healthy fats. Healthy fats called Omega-3 fatty acids are found in foods such as flaxseeds and coldwater fish, like sardines, salmon, and mackerel.  Limit how much you eat of the following: ? Canned or prepackaged foods. ? Food that is high in trans fat, such as fried foods. ? Food that is high in saturated fat, such as fatty meat. ? Sweets, desserts, sugary drinks, and other foods with added sugar. ? Full-fat dairy products.  Do not salt  foods before eating.  Try to eat at least 2 vegetarian meals each week.  Eat  more home-cooked food and less restaurant, buffet, and fast food.  When eating at a restaurant, ask that your food be prepared with less salt or no salt, if possible. What foods are recommended? The items listed may not be a complete list. Talk with your dietitian about what dietary choices are best for you. Grains Whole-grain or whole-wheat bread. Whole-grain or whole-wheat pasta. Brown rice. [place]. Bulgur. Whole-grain and low-sodium cereals. Pita bread. Low-fat, low-sodium crackers. Whole-wheat flour tortillas. Vegetables Fresh or frozen vegetables (raw, steamed, roasted, or grilled). Low-sodium or reduced-sodium tomato and vegetable juice. Low-sodium or reduced-sodium tomato sauce and tomato paste. Low-sodium or reduced-sodium canned vegetables. Fruits All fresh, dried, or frozen fruit. Canned fruit in natural juice (without added sugar). Meat and other protein foods Skinless chicken or [place]. Ground chicken or [place]. Pork with fat trimmed off. Fish and seafood. Egg whites. Dried beans, peas, or lentils. Unsalted nuts, nut butters, and seeds. Unsalted canned beans. Lean cuts of beef with fat trimmed off. Low-sodium, lean deli meat. Dairy Low-fat (1%) or fat-free (skim) milk. Fat-free, low-fat, or reduced-fat cheeses. Nonfat, low-sodium ricotta or cottage cheese. Low-fat or nonfat yogurt. Low-fat, low-sodium cheese. Fats and oils Soft margarine without trans fats. Vegetable oil. Low-fat, reduced-fat, or light mayonnaise and salad dressings (reduced-sodium). Canola, safflower, olive, soybean, and sunflower oils. Avocado. Seasoning and other foods Herbs. Spices. Seasoning mixes without salt. Unsalted popcorn and pretzels. Fat-free sweets. What foods are not recommended? The items listed may not be a complete list. Talk with your dietitian about what dietary choices are best for you. Grains Baked goods made with fat, such as croissants, muffins, or some breads. Dry pasta or rice meal  packs. Vegetables Creamed or fried vegetables. Vegetables in a cheese sauce. Regular canned vegetables (not low-sodium or reduced-sodium). Regular canned tomato sauce and paste (not low-sodium or reduced-sodium). Regular tomato and vegetable juice (not low-sodium or reduced-sodium). Angie Fava. Olives. Fruits Canned fruit in a light or heavy syrup. Fried fruit. Fruit in cream or butter sauce. Meat and other protein foods Fatty cuts of meat. Ribs. Fried meat. Berniece Salines. Sausage. Bologna and other processed lunch meats. Salami. Fatback. Hotdogs. Bratwurst. Salted nuts and seeds. Canned beans with added salt. Canned or smoked fish. Whole eggs or egg yolks. Chicken or [place] with skin. Dairy Whole or 2% milk, cream, and half-and-half. Whole or full-fat cream cheese. Whole-fat or sweetened yogurt. Full-fat cheese. Nondairy creamers. Whipped toppings. Processed cheese and cheese spreads. Fats and oils Butter. Stick margarine. Lard. Shortening. Ghee. Bacon fat. Tropical oils, such as coconut, palm kernel, or palm oil. Seasoning and other foods Salted popcorn and pretzels. Onion salt, garlic salt, seasoned salt, table salt, and sea salt. Worcestershire sauce. Tartar sauce. Barbecue sauce. Teriyaki sauce. Soy sauce, including reduced-sodium. Steak sauce. Canned and packaged gravies. Fish sauce. Oyster sauce. Cocktail sauce. Horseradish that you find on the shelf. Ketchup. Mustard. Meat flavorings and tenderizers. Bouillon cubes. Hot sauce and Tabasco sauce. Premade or packaged marinades. Premade or packaged taco seasonings. Relishes. Regular salad dressings. Where to find more information:  National Heart, Lung, and [place]: https://wilson-eaton.com/  American Heart Association: www.heart.org Summary  The DASH eating plan is a healthy eating plan that has been shown to reduce high blood pressure (hypertension). It may also reduce your risk for type 2 diabetes, heart disease, and stroke.  With the DASH eating  plan, you should limit salt (sodium) intake to  2,300 mg a day. If you have hypertension, you may need to reduce your sodium intake to 1,500 mg a day.  When on the DASH eating plan, aim to eat more fresh fruits and vegetables, whole grains, lean proteins, low-fat dairy, and heart-healthy fats.  Work with your health care provider or diet and nutrition specialist (dietitian) to adjust your eating plan to your individual calorie needs. This information is not intended to replace advice given to you by your health care provider. Make sure you discuss any questions you have with your health care provider. Document Released: 10/14/2011 Document Revised: 10/18/2016 Document Reviewed: 10/18/2016 Elsevier Interactive Patient Education  2019 [employer].

## 2018-11-27 MED FILL — OMEPRAZOLE DR 40 MG CAPSULE: 40 | 30 days supply | Qty: 30 | Fill #2

## 2018-11-27 MED FILL — AMLODIPINE BESYLATE 5 MG TA: 5 | 30 days supply | Qty: 30 | Fill #1

## 2018-11-27 MED FILL — LISINOPRIL-HCTZ 20-12.5 MG: 20-12.5 | 30 days supply | Qty: 30 | Fill #1

## 2018-12-28 MED FILL — OMEPRAZOLE DR 40 MG CAPSULE: 40 | 30 days supply | Qty: 30 | Fill #3

## 2018-12-28 MED FILL — LISINOPRIL-HCTZ 20-12.5 MG: 20-12.5 | 30 days supply | Qty: 30 | Fill #2

## 2018-12-28 MED FILL — AMLODIPINE BESYLATE 5 MG TA: 5 | 30 days supply | Qty: 30 | Fill #2

## 2018-12-28 MED FILL — OLOPATADINE HCL 0.2% EYE DR: 0.2 | 20 days supply | Qty: 3 | Fill #2

## 2019-01-25 MED FILL — LISINOPRIL-HCTZ 20-12.5 MG: 20-12.5 | 30 days supply | Qty: 30 | Fill #2

## 2019-01-25 MED FILL — AMLODIPINE BESYLATE 5 MG TA: 5 | 30 days supply | Qty: 30 | Fill #3

## 2019-02-05 ENCOUNTER — Other Ambulatory Visit: Payer: Self-pay

## 2019-02-05 ENCOUNTER — Ambulatory Visit (INDEPENDENT_AMBULATORY_CARE_PROVIDER_SITE_OTHER): Payer: Medicare HMO | Admitting: Family Medicine

## 2019-02-05 ENCOUNTER — Encounter: Payer: Self-pay | Admitting: Family Medicine

## 2019-02-05 VITALS — BP 130/82 | HR 90 | Temp 97.4°F | Ht 61.0 in | Wt 210.0 lb

## 2019-02-05 DIAGNOSIS — Z09 Encounter for follow-up examination after completed treatment for conditions other than malignant neoplasm: Secondary | ICD-10-CM

## 2019-02-05 DIAGNOSIS — G629 Polyneuropathy, unspecified: Secondary | ICD-10-CM | POA: Diagnosis not present

## 2019-02-05 DIAGNOSIS — R21 Rash and other nonspecific skin eruption: Secondary | ICD-10-CM | POA: Diagnosis not present

## 2019-02-05 DIAGNOSIS — N951 Menopausal and female climacteric states: Secondary | ICD-10-CM | POA: Diagnosis not present

## 2019-02-05 DIAGNOSIS — Z6841 Body Mass Index (BMI) 40.0 and over, adult: Secondary | ICD-10-CM | POA: Diagnosis not present

## 2019-02-05 DIAGNOSIS — I10 Essential (primary) hypertension: Secondary | ICD-10-CM

## 2019-02-05 DIAGNOSIS — E66813 Obesity, class 3: Secondary | ICD-10-CM

## 2019-02-05 DIAGNOSIS — K2100 Gastro-esophageal reflux disease with esophagitis, without bleeding: Secondary | ICD-10-CM

## 2019-02-05 DIAGNOSIS — Z0000 Encounter for general adult medical examination without abnormal findings: Secondary | ICD-10-CM

## 2019-02-05 DIAGNOSIS — T7840XA Allergy, unspecified, initial encounter: Secondary | ICD-10-CM

## 2019-02-05 DIAGNOSIS — M79672 Pain in left foot: Secondary | ICD-10-CM

## 2019-02-05 LAB — POCT URINALYSIS DIP (MANUAL ENTRY)
Bilirubin, UA: NEGATIVE
Glucose, UA: NEGATIVE mg/dL
Ketones, POC UA: NEGATIVE mg/dL
Leukocytes, UA: NEGATIVE
Nitrite, UA: NEGATIVE
Protein Ur, POC: NEGATIVE mg/dL
Spec Grav, UA: 1.025 (ref 1.010–1.025)
Urobilinogen, UA: 0.2 E.U./dL
pH, UA: 5 (ref 5.0–8.0)

## 2019-02-05 MED ORDER — DICLOFENAC SODIUM 1 % TD GEL: 2.0000 g | Freq: Four times a day (QID) | TRANSDERMAL | 3 refills | Status: DC

## 2019-02-05 MED ORDER — DIPHENHYDRAMINE HCL 2 % EX CREA: TOPICAL_CREAM | Freq: Three times a day (TID) | CUTANEOUS | 3 refills | Status: DC | PRN

## 2019-02-05 MED ORDER — OMEPRAZOLE 40 MG PO CPDR: 40.0000 mg | DELAYED_RELEASE_CAPSULE | Freq: Every day | ORAL | 3 refills | Status: DC

## 2019-02-05 MED ORDER — ACETAMINOPHEN 500 MG PO TABS: 500.0000 mg | ORAL_TABLET | Freq: Four times a day (QID) | ORAL | 3 refills | Status: DC | PRN

## 2019-02-05 NOTE — Progress Notes (Signed)
Patient [facility]  Established Patient Office Visit  Subjective:  Patient ID: Deanna Floyd, female    DOB: [birth year]  Age: 58 y.o. MRN: [id number]  CC:  Chief Complaint  Patient presents with  . Follow-up    Chronic condition     HPI Deanna Floyd is a 58 year old female who presents for Follow Up today.   Past Medical History:  Diagnosis Date  . Hypertension   . Stroke [facility]) 2007   Current Status: Since her last office visit, she is doing well with no complaints. Her hot flashes are better. She is currently drinking Slim Fast in a effort to loose weight. She has loss 5 lbs. She continues to increase activity at home. She experiences left foot pain, which she soaks in epsom salt and alcohol. She denies visual changes, chest pain, cough, shortness of breath, heart palpitations, and falls. She has occasional headaches and dizziness with position changes. Denies severe headaches, confusion, seizures, double vision, and blurred vision, nausea and vomiting.  She denies fevers, chills, fatigue, recent infections, weight loss, and night sweats. No reports of GI problems such as diarrhea, and constipation. She has no reports of blood in stools, dysuria and hematuria. No depression or anxiety reported.    Past Surgical History:  Procedure Laterality Date  . FOOT SURGERY  2015   left side had 2 surgeries in 2015  . TRIGGER FINGER RELEASE  2010    Family History  Problem Relation Age of Onset  . Hypertension Sister   . Colon cancer Neg Hx   . Stomach cancer Neg Hx     Social History   Socioeconomic History  . Marital status: Single    Spouse name: Not on file  . Number of children: Not on file  . Years of education: Not on file  . Highest education level: Not on file  Occupational History  . Not on file  Social Needs  . Financial resource strain: Not on file  . Food insecurity:    Worry: Not on file    Inability: Not on  file  . Transportation needs:    Medical: Not on file    Non-medical: Not on file  Tobacco Use  . Smoking status: Former Research scientist (life sciences)  . Smokeless tobacco: Never Used  Substance and Sexual Activity  . Alcohol use: Yes    Comment: occ wine   . Drug use: No  . Sexual activity: Never  Lifestyle  . Physical activity:    Days per week: Not on file    Minutes per session: Not on file  . Stress: Not on file  Relationships  . Social connections:    Talks on phone: Not on file    Gets together: Not on file    Attends religious service: Not on file    Active member of club or organization: Not on file    Attends meetings of clubs or organizations: Not on file    Relationship status: Not on file  . Intimate partner violence:    Fear of current or ex partner: Not on file    Emotionally abused: Not on file    Physically abused: Not on file    Forced sexual activity: Not on file  Other Topics Concern  . Not on file  Social History Narrative  . Not on file    Outpatient Medications Prior to Visit  Medication Sig Dispense Refill  . amLODipine (NORVASC) 5 MG tablet  Take 1 tablet (5 mg total) by mouth daily. 30 tablet 2  . gabapentin (NEURONTIN) 300 MG capsule Take 1 capsule (300 mg total) by mouth at bedtime. As needed. 30 capsule 3  . lisinopril-hydrochlorothiazide (PRINZIDE,ZESTORETIC) 20-12.5 MG tablet Take 1 tablet by mouth daily. 30 tablet 2  . diclofenac sodium (VOLTAREN) 1 % GEL Apply 2 g topically 4 (four) times daily. Rub into affected area of foot 2 to 4 times daily 100 g 2  . omeprazole (PRILOSEC) 40 MG capsule Take 1 capsule (40 mg total) by mouth daily. 30 capsule 3  . cetirizine (ZYRTEC) 10 MG tablet Take 1 tablet (10 mg total) by mouth daily. (Patient not taking: Reported on 11/06/2018) 30 tablet 11  . meloxicam (MOBIC) 15 MG tablet Take 1 tablet (15 mg total) by mouth daily. (Patient not taking: Reported on 11/06/2018) 30 tablet 2  . Multiple Vitamin (MULTIVITAMIN WITH MINERALS)  TABS tablet Take 1 tablet by mouth daily.    . Acetaminophen-Codeine (TYLENOL/CODEINE #3) 300-30 MG tablet Take 1 tablet by mouth at bedtime. (Patient not taking: Reported on 11/06/2018) 30 tablet 0  . diphenhydrAMINE (BENADRYL) 2 % cream Apply topically 3 (three) times daily as needed for itching. (Patient not taking: Reported on 02/05/2019) 30 g 1   Facility-Administered Medications Prior to Visit  Medication Dose Route Frequency Provider Last Rate Last Dose  . 0.9 %  sodium chloride infusion  500 mL Intravenous Once Doran Stabler, MD        Allergies  Allergen Reactions  . Hydrocortisone Hives  . Lyrica [Pregabalin] Rash    ROS Review of Systems  Constitutional: Negative.   HENT: Negative.   Eyes: Negative.   Respiratory: Negative.   Cardiovascular: Negative.   Gastrointestinal: Negative.   Endocrine: Negative.   Genitourinary: Negative.   Musculoskeletal: Negative.   Skin: Negative.   Allergic/Immunologic: Negative.   Neurological: Positive for dizziness and headaches.  Hematological: Negative.   Psychiatric/Behavioral: Negative.    Objective:    Physical Exam  Constitutional: She is oriented to person, place, and time. She appears well-developed and well-nourished.  HENT:  Head: Normocephalic and atraumatic.  Eyes: Conjunctivae are normal.  Neck: Normal range of motion. Neck supple.  Cardiovascular: Normal rate, regular rhythm, normal heart sounds and intact distal pulses.  Pulmonary/Chest: Effort normal and breath sounds normal.  Abdominal: Soft. Bowel sounds are normal.  Neurological: She is alert and oriented to person, place, and time. She has normal reflexes.  Skin: Skin is warm and dry.  Psychiatric: She has a normal mood and affect. Her behavior is normal. Judgment and thought content normal.  Nursing note and vitals reviewed.   BP 130/82 (BP Location: Left Arm, Patient Position: Sitting, Cuff Size: Large)   Pulse 90   Temp (!) 97.4 F (36.3 C)  (Oral)   Ht 5\' 1"  (1.549 m)   Wt 210 lb (95.3 kg)   SpO2 97%   BMI 39.68 kg/m  Wt Readings from Last 3 Encounters:  02/05/19 210 lb (95.3 kg)  11/06/18 214 lb (97.1 kg)  08/01/18 215 lb (97.5 kg)     There are no preventive care reminders to display for this patient.  There are no preventive care reminders to display for this patient.  Lab Results  Component Value Date   TSH 1.263 10/06/2015   Lab Results  Component Value Date   WBC 5.6 03/10/2018   HGB 11.9 03/10/2018   HCT 35.9 03/10/2018   MCV 99 (H) 03/10/2018  PLT 249 03/10/2018   Lab Results  Component Value Date   NA 142 02/03/2018   K 4.0 02/03/2018   CO2 22 02/03/2018   GLUCOSE 99 02/03/2018   BUN 19 02/03/2018   CREATININE 0.81 02/03/2018   BILITOT <0.2 02/03/2018   ALKPHOS 78 02/03/2018   AST 19 02/03/2018   ALT 21 02/03/2018   PROT 7.3 02/03/2018   ALBUMIN 4.5 02/03/2018   CALCIUM 9.4 02/03/2018   ANIONGAP 6 06/04/2015   Lab Results  Component Value Date   CHOL 186 02/03/2018   Lab Results  Component Value Date   HDL 80 02/03/2018   Lab Results  Component Value Date   LDLCALC 89 02/03/2018   Lab Results  Component Value Date   TRIG 85 02/03/2018   Lab Results  Component Value Date   CHOLHDL 2.3 02/03/2018   Lab Results  Component Value Date   HGBA1C 5.3 08/01/2018    Assessment & Plan:   1. Essential hypertension She will continue to decrease high sodium intake, excessive alcohol intake, increase potassium intake, smoking cessation, and increase physical activity of at least 30 minutes of cardio activity daily. She will continue to follow Heart Healthy or DASH diet. - POCT urinalysis dipstick - CBC with Differential - Comprehensive metabolic panel - TSH - Lipid Panel - Vitamin D, 25-hydroxy - Vitamin B12  2. Neuropathy We will increase Acetaminophen to 500 mg today.  - acetaminophen (TYLENOL) 500 MG tablet; Take 1 tablet (500 mg total) by mouth every 6 (six) hours as  needed.  Dispense: 30 tablet; Refill: 3  3. Rash due to allergy - diphenhydrAMINE (BENADRYL) 2 % cream; Apply topically 3 (three) times daily as needed for itching.  Dispense: 30 g; Refill: 3  4. Gastroesophageal reflux disease with esophagitis - omeprazole (PRILOSEC) 40 MG capsule; Take 1 capsule (40 mg total) by mouth daily.  Dispense: 30 capsule; Refill: 3  5. Class 3 severe obesity due to excess calories with serious comorbidity and body mass index (BMI) of 40.0 to 44.9 in adult (HCC) - CBC with Differential - Comprehensive metabolic panel - TSH - Lipid Panel - Vitamin D, 25-hydroxy - Vitamin B12  6. Left foot pain Stable, not worsening.   7. Hot flashes due to menopause Stable today.   8. Healthcare maintenance - CBC with Differential - Comprehensive metabolic panel - TSH - Lipid Panel - Vitamin D, 25-hydroxy - Vitamin B12  9. Follow up She will follow up in 3 months.     Problem List Items Addressed This Visit      Cardiovascular and Mediastinum   Essential hypertension - Primary   Relevant Orders   POCT urinalysis dipstick   CBC with Differential   Comprehensive metabolic panel   TSH   Lipid Panel   Vitamin D, 25-hydroxy   Vitamin B12     Other   Obesity   Relevant Orders   CBC with Differential   Comprehensive metabolic panel   TSH   Lipid Panel   Vitamin D, 25-hydroxy   Vitamin B12    Other Visit Diagnoses    Neuropathy       Relevant Medications   acetaminophen (TYLENOL) 500 MG tablet   Rash due to allergy       Relevant Medications   diphenhydrAMINE (BENADRYL) 2 % cream   Gastroesophageal reflux disease with esophagitis       Relevant Medications   omeprazole (PRILOSEC) 40 MG capsule   Left foot pain  Hot flashes due to menopause       Follow up       Healthcare maintenance       Relevant Orders   CBC with Differential   Comprehensive metabolic panel   TSH   Lipid Panel   Vitamin D, 25-hydroxy   Vitamin B12      Meds  ordered this encounter  Medications  . omeprazole (PRILOSEC) 40 MG capsule    Sig: Take 1 capsule (40 mg total) by mouth daily.    Dispense:  30 capsule    Refill:  3  . acetaminophen (TYLENOL) 500 MG tablet    Sig: Take 1 tablet (500 mg total) by mouth every 6 (six) hours as needed.    Dispense:  30 tablet    Refill:  3  . diclofenac sodium (VOLTAREN) 1 % GEL    Sig: Apply 2 g topically 4 (four) times daily. Rub into affected area of foot 2 to 4 times daily    Dispense:  100 g    Refill:  3  . diphenhydrAMINE (BENADRYL) 2 % cream    Sig: Apply topically 3 (three) times daily as needed for itching.    Dispense:  30 g    Refill:  3    Follow-up: Return in about 3 months (around 05/08/2019).    Azzie Glatter, FNP

## 2019-02-06 LAB — CBC WITH DIFFERENTIAL/PLATELET
Basophils Absolute: 0 10*3/uL (ref 0.0–0.2)
Basos: 0 %
EOS (ABSOLUTE): 0.1 10*3/uL (ref 0.0–0.4)
Eos: 3 %
Hematocrit: 36.7 % (ref 34.0–46.6)
Hemoglobin: 12.6 g/dL (ref 11.1–15.9)
Immature Grans (Abs): 0 10*3/uL (ref 0.0–0.1)
Immature Granulocytes: 0 %
Lymphocytes Absolute: 2 10*3/uL (ref 0.7–3.1)
Lymphs: 40 %
MCH: 31.9 pg (ref 26.6–33.0)
MCHC: 34.3 g/dL (ref 31.5–35.7)
MCV: 93 fL (ref 79–97)
Monocytes Absolute: 0.5 10*3/uL (ref 0.1–0.9)
Monocytes: 10 %
Neutrophils Absolute: 2.3 10*3/uL (ref 1.4–7.0)
Neutrophils: 47 %
Platelets: 294 10*3/uL (ref 150–450)
RBC: 3.95 x10E6/uL (ref 3.77–5.28)
RDW: 12.8 % (ref 11.7–15.4)
WBC: 4.9 10*3/uL (ref 3.4–10.8)

## 2019-02-06 LAB — LIPID PANEL
Chol/HDL Ratio: 2.1 ratio (ref 0.0–4.4)
Cholesterol, Total: 177 mg/dL (ref 100–199)
HDL: 86 mg/dL (ref >39)
LDL Calculated: 75 mg/dL (ref 0–99)
Triglycerides: 82 mg/dL (ref 0–149)
VLDL Cholesterol Cal: 16 mg/dL (ref 5–40)

## 2019-02-06 LAB — TSH: TSH: 1.48 u[IU]/mL (ref 0.450–4.500)

## 2019-02-06 LAB — COMPREHENSIVE METABOLIC PANEL
ALT: 24 IU/L (ref 0–32)
AST: 15 IU/L (ref 0–40)
Albumin/Globulin Ratio: 1.7 (ref 1.2–2.2)
Albumin: 4.7 g/dL (ref 3.8–4.9)
Alkaline Phosphatase: 78 IU/L (ref 39–117)
BUN/Creatinine Ratio: 19 (ref 9–23)
BUN: 14 mg/dL (ref 6–24)
Bilirubin Total: 0.4 mg/dL (ref 0.0–1.2)
CO2: 22 mmol/L (ref 20–29)
Calcium: 10 mg/dL (ref 8.7–10.2)
Chloride: 101 mmol/L (ref 96–106)
Creatinine, Ser: 0.74 mg/dL (ref 0.57–1.00)
GFR calc Af Amer: 103 mL/min/{1.73_m2} (ref >59)
GFR calc non Af Amer: 89 mL/min/{1.73_m2} (ref >59)
Globulin, Total: 2.8 g/dL (ref 1.5–4.5)
Glucose: 94 mg/dL (ref 65–99)
Potassium: 4.2 mmol/L (ref 3.5–5.2)
Sodium: 141 mmol/L (ref 134–144)
Total Protein: 7.5 g/dL (ref 6.0–8.5)

## 2019-02-06 LAB — VITAMIN B12: Vitamin B-12: 411 pg/mL (ref 232–1245)

## 2019-02-06 LAB — VITAMIN D 25 HYDROXY (VIT D DEFICIENCY, FRACTURES): Vit D, 25-Hydroxy: 18.8 ng/mL — ABNORMAL LOW (ref 30.0–100.0)

## 2019-02-10 MED FILL — DICLOFENAC SODIUM 1% GEL: 1 | 12 days supply | Qty: 100 | Fill #0

## 2019-02-10 MED FILL — OMEPRAZOLE DR 40 MG CAPSULE: 40 | 30 days supply | Qty: 30 | Fill #0

## 2019-02-27 MED FILL — AMLODIPINE BESYLATE 5 MG TA: 5 | 90 days supply | Qty: 90 | Fill #4

## 2019-02-27 MED FILL — LISINOPRIL-HCTZ 20-12.5 MG: 20-12.5 | 90 days supply | Qty: 90 | Fill #3

## 2019-03-13 DIAGNOSIS — H2513 Age-related nuclear cataract, bilateral: Secondary | ICD-10-CM | POA: Diagnosis not present

## 2019-03-13 DIAGNOSIS — H35033 Hypertensive retinopathy, bilateral: Secondary | ICD-10-CM | POA: Diagnosis not present

## 2019-03-13 DIAGNOSIS — H401134 Primary open-angle glaucoma, bilateral, indeterminate stage: Secondary | ICD-10-CM | POA: Diagnosis not present

## 2019-03-13 DIAGNOSIS — H25013 Cortical age-related cataract, bilateral: Secondary | ICD-10-CM | POA: Diagnosis not present

## 2019-03-13 MED FILL — LATANOPROST 0.005% EYE DRP: 0.005 | 18 days supply | Qty: 3 | Fill #0

## 2019-03-13 MED FILL — OMEPRAZOLE DR 40 MG CAPSULE: 40 | 30 days supply | Qty: 30 | Fill #1

## 2019-04-06 ENCOUNTER — Encounter (HOSPITAL_COMMUNITY): Payer: Self-pay

## 2019-04-06 ENCOUNTER — Encounter (HOSPITAL_COMMUNITY): Payer: Self-pay | Admitting: Optometry

## 2019-04-17 MED FILL — OMEPRAZOLE DR 40 MG CAPSULE: 40 | 30 days supply | Qty: 30 | Fill #2

## 2019-04-20 DIAGNOSIS — H0288B Meibomian gland dysfunction left eye, upper and lower eyelids: Secondary | ICD-10-CM | POA: Diagnosis not present

## 2019-04-20 DIAGNOSIS — H401131 Primary open-angle glaucoma, bilateral, mild stage: Secondary | ICD-10-CM | POA: Diagnosis not present

## 2019-04-20 DIAGNOSIS — H0288A Meibomian gland dysfunction right eye, upper and lower eyelids: Secondary | ICD-10-CM | POA: Diagnosis not present

## 2019-04-20 DIAGNOSIS — H1045 Other chronic allergic conjunctivitis: Secondary | ICD-10-CM | POA: Diagnosis not present

## 2019-04-20 MED FILL — LATANOPROST 0.005% EYE DRP: 0.005 | 20 days supply | Qty: 3 | Fill #0

## 2019-05-08 ENCOUNTER — Encounter: Payer: Self-pay | Admitting: Family Medicine

## 2019-05-08 ENCOUNTER — Ambulatory Visit (HOSPITAL_COMMUNITY): Payer: Medicare HMO

## 2019-05-08 ENCOUNTER — Other Ambulatory Visit: Payer: Self-pay

## 2019-05-08 ENCOUNTER — Ambulatory Visit (INDEPENDENT_AMBULATORY_CARE_PROVIDER_SITE_OTHER): Payer: Medicare HMO | Admitting: Family Medicine

## 2019-05-08 VITALS — BP 116/70 | HR 80 | Temp 98.1°F | Ht 61.0 in | Wt 215.0 lb

## 2019-05-08 DIAGNOSIS — R1011 Right upper quadrant pain: Secondary | ICD-10-CM | POA: Diagnosis not present

## 2019-05-08 DIAGNOSIS — K2100 Gastro-esophageal reflux disease with esophagitis, without bleeding: Secondary | ICD-10-CM | POA: Insufficient documentation

## 2019-05-08 DIAGNOSIS — I10 Essential (primary) hypertension: Secondary | ICD-10-CM

## 2019-05-08 DIAGNOSIS — Z09 Encounter for follow-up examination after completed treatment for conditions other than malignant neoplasm: Secondary | ICD-10-CM

## 2019-05-08 DIAGNOSIS — N951 Menopausal and female climacteric states: Secondary | ICD-10-CM | POA: Diagnosis not present

## 2019-05-08 LAB — POCT URINALYSIS DIP (MANUAL ENTRY)
Bilirubin, UA: NEGATIVE
Blood, UA: NEGATIVE
Glucose, UA: NEGATIVE mg/dL
Ketones, POC UA: NEGATIVE mg/dL
Leukocytes, UA: NEGATIVE
Nitrite, UA: NEGATIVE
Protein Ur, POC: NEGATIVE mg/dL
Spec Grav, UA: 1.025 (ref 1.010–1.025)
Urobilinogen, UA: 0.2 E.U./dL
pH, UA: 5.5 (ref 5.0–8.0)

## 2019-05-08 NOTE — Progress Notes (Signed)
Patient [facility]   Established Patient Office Visit  Subjective:  Patient ID: Deanna Floyd, female    DOB: 1961/03/12  Age: 58 y.o. MRN: [id number]  CC:  Chief Complaint  Patient presents with  . Follow-up    chronic condition   . Gas  . Foot Swelling    HPI Deanna Floyd is a female who presents for follow up today.   Past Medical History:  Diagnosis Date  . Hypertension   . Stroke [facility]) 2007   Current Status: Since her last office visit, she has c/o of intermittent right upper quadrant pain after eating,  X 1 month, with pain lasting for 5 days or more. Pain radiates to right flank area. She has tried [employer] and [employer] with minimal relief. She states that incidents have been more frequent lately.  She denies visual changes, chest pain, cough, shortness of breath, heart palpitations, and falls. She has occasional headaches and dizziness with position changes. Denies severe headaches, confusion, seizures, double vision, and blurred vision, nausea and vomiting.  She denies fevers, chills, fatigue, recent infections, weight loss, and night sweats. No reports of GI problems such as vomiting, diarrhea, and constipation. She has no reports of blood in stools, dysuria and hematuria. No depression or anxiety reported. She denies pain today.   Past Surgical History:  Procedure Laterality Date  . FOOT SURGERY  2015   left side had 2 surgeries in 2015  . TRIGGER FINGER RELEASE  2010    Family History  Problem Relation Age of Onset  . Hypertension Sister   . Colon cancer Neg Hx   . Stomach cancer Neg Hx     Social History   Socioeconomic History  . Marital status: Single    Spouse name: Not on file  . Number of children: Not on file  . Years of education: Not on file  . Highest education level: Not on file  Occupational History  . Not on file  Social Needs  . Financial resource strain: Not on file  . Food insecurity     Worry: Not on file    Inability: Not on file  . Transportation needs    Medical: Not on file    Non-medical: Not on file  Tobacco Use  . Smoking status: Former Research scientist (life sciences)  . Smokeless tobacco: Never Used  Substance and Sexual Activity  . Alcohol use: Yes    Comment: occ wine   . Drug use: No  . Sexual activity: Never  Lifestyle  . Physical activity    Days per week: Not on file    Minutes per session: Not on file  . Stress: Not on file  Relationships  . Social Herbalist on phone: Not on file    Gets together: Not on file    Attends religious service: Not on file    Active member of club or organization: Not on file    Attends meetings of clubs or organizations: Not on file    Relationship status: Not on file  . Intimate partner violence    Fear of current or ex partner: Not on file    Emotionally abused: Not on file    Physically abused: Not on file    Forced sexual activity: Not on file  Other Topics Concern  . Not on file  Social History Narrative  . Not on file    Outpatient Medications Prior to Visit  Medication Sig Dispense  Refill  . acetaminophen (TYLENOL) 500 MG tablet Take 1 tablet (500 mg total) by mouth every 6 (six) hours as needed. 30 tablet 3  . amLODipine (NORVASC) 5 MG tablet Take 1 tablet (5 mg total) by mouth daily. 30 tablet 2  . gabapentin (NEURONTIN) 300 MG capsule Take 1 capsule (300 mg total) by mouth at bedtime. As needed. 30 capsule 3  . lisinopril-hydrochlorothiazide (PRINZIDE,ZESTORETIC) 20-12.5 MG tablet Take 1 tablet by mouth daily. 30 tablet 2  . meloxicam (MOBIC) 15 MG tablet Take 1 tablet (15 mg total) by mouth daily. 30 tablet 2  . Multiple Vitamin (MULTIVITAMIN WITH MINERALS) TABS tablet Take 1 tablet by mouth daily.    Marland Kitchen omeprazole (PRILOSEC) 40 MG capsule Take 1 capsule (40 mg total) by mouth daily. 30 capsule 3  . cetirizine (ZYRTEC) 10 MG tablet Take 1 tablet (10 mg total) by mouth daily. (Patient not taking: Reported on  11/06/2018) 30 tablet 11  . diclofenac sodium (VOLTAREN) 1 % GEL Apply 2 g topically 4 (four) times daily. Rub into affected area of foot 2 to 4 times daily (Patient not taking: Reported on 05/08/2019) 100 g 3  . diphenhydrAMINE (BENADRYL) 2 % cream Apply topically 3 (three) times daily as needed for itching. (Patient not taking: Reported on 05/08/2019) 30 g 3   Facility-Administered Medications Prior to Visit  Medication Dose Route Frequency Provider Last Rate Last Dose  . 0.9 %  sodium chloride infusion  500 mL Intravenous Once Doran Stabler, MD        Allergies  Allergen Reactions  . Hydrocortisone Hives  . Lyrica [Pregabalin] Rash    ROS Review of Systems  Constitutional: Negative.   HENT: Negative.   Eyes: Negative.   Respiratory: Negative.   Cardiovascular: Negative.   Gastrointestinal: Positive for abdominal pain (right upper quadrant pain).  Endocrine: Negative.   Genitourinary: Negative.   Musculoskeletal: Negative.   Skin: Negative.   Allergic/Immunologic: Negative.   Neurological: Positive for dizziness (occasional) and headaches (Occasional).  Hematological: Negative.   Psychiatric/Behavioral: Negative.       Objective:    Physical Exam  Constitutional: She is oriented to person, place, and time. She appears well-developed and well-nourished.  HENT:  Head: Normocephalic and atraumatic.  Eyes: Conjunctivae are normal.  Neck: Normal range of motion. Neck supple.  Cardiovascular: Normal rate, regular rhythm, normal heart sounds and intact distal pulses.  Pulmonary/Chest: Effort normal and breath sounds normal.  Abdominal: Soft. Bowel sounds are normal.  Musculoskeletal: Normal range of motion.  Neurological: She is alert and oriented to person, place, and time. She has normal reflexes.  Skin: Skin is warm and dry.  Psychiatric: She has a normal mood and affect. Her behavior is normal. Judgment and thought content normal.  Nursing note and vitals  reviewed.   BP 116/70 (BP Location: Left Arm, Patient Position: Sitting, Cuff Size: Large)   Pulse 80   Temp 98.1 F (36.7 C) (Oral)   Ht 5\' 1"  (1.549 m)   Wt 215 lb (97.5 kg)   SpO2 98%   BMI 40.62 kg/m  Wt Readings from Last 3 Encounters:  05/08/19 215 lb (97.5 kg)  02/05/19 210 lb (95.3 kg)  11/06/18 214 lb (97.1 kg)     There are no preventive care reminders to display for this patient.  There are no preventive care reminders to display for this patient.  Lab Results  Component Value Date   TSH 1.480 02/05/2019   Lab Results  Component Value Date   WBC 4.9 02/05/2019   HGB 12.6 02/05/2019   HCT 36.7 02/05/2019   MCV 93 02/05/2019   PLT 294 02/05/2019   Lab Results  Component Value Date   NA 141 02/05/2019   K 4.2 02/05/2019   CO2 22 02/05/2019   GLUCOSE 94 02/05/2019   BUN 14 02/05/2019   CREATININE 0.74 02/05/2019   BILITOT 0.4 02/05/2019   ALKPHOS 78 02/05/2019   AST 15 02/05/2019   ALT 24 02/05/2019   PROT 7.5 02/05/2019   ALBUMIN 4.7 02/05/2019   CALCIUM 10.0 02/05/2019   ANIONGAP 6 06/04/2015   Lab Results  Component Value Date   CHOL 177 02/05/2019   Lab Results  Component Value Date   HDL 86 02/05/2019   Lab Results  Component Value Date   LDLCALC 75 02/05/2019   Lab Results  Component Value Date   TRIG 82 02/05/2019   Lab Results  Component Value Date   CHOLHDL 2.1 02/05/2019   Lab Results  Component Value Date   HGBA1C 5.3 08/01/2018      Assessment & Plan:   1. Right upper quadrant pain Increasing pain in RUQ. We will get Abdominal US today. - US Abdomen Limited RUQ; Future  2. Essential hypertension The current medical regimen is effective; blood pressure is stable at 116/70 today; continue present plan and medications as prescribed. She will continue to decrease high sodium intake, excessive alcohol intake, increase potassium intake, smoking cessation, and increase physical activity of at least 30 minutes of cardio  activity daily. She will continue to follow Heart Healthy or DASH diet. - POCT urinalysis dipstick  3. Hot flashes due to menopause Stable today. Not worsening.   4. Gastroesophageal reflux disease with esophagitis Stable. She will continue Omeprazole as prescribed.   5. Follow up She will follow up in 3 months.     No orders of the defined types were placed in this encounter.   Orders Placed This Encounter  Procedures  . US Abdomen Limited RUQ  . POCT urinalysis dipstick    Referral Orders  No referral(s) requested today    Kathe Becton,  MSN, FNP-BC [facility], [place] [phone]  Problem List Items Addressed This Visit      Cardiovascular and Mediastinum   Essential hypertension   Relevant Orders   POCT urinalysis dipstick (Completed)    Other Visit Diagnoses    Right upper quadrant pain    -  Primary   Relevant Orders   US Abdomen Limited RUQ   Hot flashes due to menopause       Gastroesophageal reflux disease with esophagitis       Follow up          No orders of the defined types were placed in this encounter.   Follow-up: No follow-ups on file.    Azzie Glatter, FNP

## 2019-05-15 ENCOUNTER — Ambulatory Visit (HOSPITAL_COMMUNITY): Admission: RE | Admit: 2019-05-15 | Payer: Medicare HMO | Source: Ambulatory Visit

## 2019-05-17 MED FILL — OMEPRAZOLE DR 40 MG CAPSULE: 40 | 30 days supply | Qty: 30 | Fill #3

## 2019-05-22 ENCOUNTER — Other Ambulatory Visit: Payer: Self-pay

## 2019-05-22 ENCOUNTER — Ambulatory Visit (HOSPITAL_COMMUNITY)
Admission: RE | Admit: 2019-05-22 | Discharge: 2019-05-22 | Disposition: A | Discharge status: Home / Self Care | Payer: Medicare HMO | Source: Ambulatory Visit | Attending: Family Medicine | Admitting: Family Medicine

## 2019-05-22 DIAGNOSIS — R1011 Right upper quadrant pain: Secondary | ICD-10-CM | POA: Insufficient documentation

## 2019-05-22 DIAGNOSIS — K760 Fatty (change of) liver, not elsewhere classified: Secondary | ICD-10-CM | POA: Diagnosis not present

## 2019-05-28 ENCOUNTER — Other Ambulatory Visit: Payer: Self-pay | Admitting: Family Medicine

## 2019-05-28 DIAGNOSIS — I10 Essential (primary) hypertension: Secondary | ICD-10-CM

## 2019-05-28 MED ORDER — AMLODIPINE BESYLATE 5 MG PO TABS: 5.0000 mg | ORAL_TABLET | Freq: Every day | ORAL | 3 refills | Status: DC

## 2019-05-28 MED ORDER — LISINOPRIL-HYDROCHLOROTHIAZIDE 20-12.5 MG PO TABS: 1.0000 | ORAL_TABLET | Freq: Every day | ORAL | 3 refills | Status: DC

## 2019-05-28 MED FILL — LISINOPRIL-HCTZ 20-12.5 MG: 20-12.5 | 30 days supply | Qty: 30 | Fill #0

## 2019-05-28 MED FILL — AMLODIPINE BESYLATE 5 MG TA: 5 | 30 days supply | Qty: 30 | Fill #0

## 2019-06-22 ENCOUNTER — Other Ambulatory Visit: Payer: Self-pay | Admitting: Family Medicine

## 2019-06-22 DIAGNOSIS — K2100 Gastro-esophageal reflux disease with esophagitis, without bleeding: Secondary | ICD-10-CM

## 2019-06-26 MED FILL — OMEPRAZOLE DR 40 MG CAPSULE: 40 | 90 days supply | Qty: 90 | Fill #0

## 2019-06-29 MED FILL — AMLODIPINE BESYLATE 5 MG TA: 5 | 30 days supply | Qty: 30 | Fill #1

## 2019-06-29 MED FILL — LISINOPRIL-HCTZ 20-12.5 MG: 20-12.5 | 30 days supply | Qty: 30 | Fill #1

## 2019-07-20 DIAGNOSIS — H0288A Meibomian gland dysfunction right eye, upper and lower eyelids: Secondary | ICD-10-CM | POA: Diagnosis not present

## 2019-07-20 DIAGNOSIS — H1045 Other chronic allergic conjunctivitis: Secondary | ICD-10-CM | POA: Diagnosis not present

## 2019-07-20 DIAGNOSIS — H0288B Meibomian gland dysfunction left eye, upper and lower eyelids: Secondary | ICD-10-CM | POA: Diagnosis not present

## 2019-07-20 DIAGNOSIS — H401131 Primary open-angle glaucoma, bilateral, mild stage: Secondary | ICD-10-CM | POA: Diagnosis not present

## 2019-07-23 MED FILL — LATANOPROST 0.005% EYE DRP: 0.005 | 18 days supply | Qty: 3 | Fill #0

## 2019-08-01 MED FILL — LISINOPRIL-HCTZ 20-12.5 MG: 20-12.5 | 30 days supply | Qty: 30 | Fill #2

## 2019-08-01 MED FILL — AMLODIPINE BESYLATE 5 MG TA: 5 | 30 days supply | Qty: 30 | Fill #2

## 2019-08-08 ENCOUNTER — Ambulatory Visit: Payer: Medicare HMO | Admitting: Family Medicine

## 2019-08-21 ENCOUNTER — Ambulatory Visit (INDEPENDENT_AMBULATORY_CARE_PROVIDER_SITE_OTHER): Payer: Medicare HMO | Admitting: Family Medicine

## 2019-08-21 ENCOUNTER — Other Ambulatory Visit: Payer: Self-pay

## 2019-08-21 ENCOUNTER — Encounter: Payer: Self-pay | Admitting: Family Medicine

## 2019-08-21 VITALS — BP 119/72 | HR 79 | Temp 97.9°F | Ht 61.0 in | Wt 218.8 lb

## 2019-08-21 DIAGNOSIS — G629 Polyneuropathy, unspecified: Secondary | ICD-10-CM | POA: Insufficient documentation

## 2019-08-21 DIAGNOSIS — J302 Other seasonal allergic rhinitis: Secondary | ICD-10-CM | POA: Diagnosis not present

## 2019-08-21 DIAGNOSIS — Z09 Encounter for follow-up examination after completed treatment for conditions other than malignant neoplasm: Secondary | ICD-10-CM | POA: Diagnosis not present

## 2019-08-21 DIAGNOSIS — M79672 Pain in left foot: Secondary | ICD-10-CM

## 2019-08-21 DIAGNOSIS — Z23 Encounter for immunization: Secondary | ICD-10-CM

## 2019-08-21 DIAGNOSIS — N951 Menopausal and female climacteric states: Secondary | ICD-10-CM

## 2019-08-21 DIAGNOSIS — M5442 Lumbago with sciatica, left side: Secondary | ICD-10-CM

## 2019-08-21 DIAGNOSIS — G8929 Other chronic pain: Secondary | ICD-10-CM | POA: Insufficient documentation

## 2019-08-21 DIAGNOSIS — I10 Essential (primary) hypertension: Secondary | ICD-10-CM

## 2019-08-21 DIAGNOSIS — E66813 Obesity, class 3: Secondary | ICD-10-CM

## 2019-08-21 DIAGNOSIS — M5441 Lumbago with sciatica, right side: Secondary | ICD-10-CM

## 2019-08-21 DIAGNOSIS — Z6841 Body Mass Index (BMI) 40.0 and over, adult: Secondary | ICD-10-CM

## 2019-08-21 LAB — POCT URINALYSIS DIPSTICK
Bilirubin, UA: NEGATIVE
Blood, UA: NEGATIVE
Glucose, UA: NEGATIVE
Ketones, UA: NEGATIVE
Leukocytes, UA: NEGATIVE
Nitrite, UA: NEGATIVE
Protein, UA: NEGATIVE
Spec Grav, UA: >=1.03 — AB (ref 1.010–1.025)
Urobilinogen, UA: 0.2 E.U./dL
pH, UA: 5.5 (ref 5.0–8.0)

## 2019-08-21 MED ORDER — CETIRIZINE HCL 10 MG PO TABS: 10.0000 mg | ORAL_TABLET | Freq: Every day | ORAL | 11 refills | Status: DC

## 2019-08-21 MED ORDER — CYCLOBENZAPRINE HCL 10 MG PO TABS: 10.0000 mg | ORAL_TABLET | Freq: Three times a day (TID) | ORAL | 3 refills | Status: DC | PRN

## 2019-08-21 MED ORDER — DICLOFENAC SODIUM 1 % TD GEL: 2.0000 g | Freq: Four times a day (QID) | TRANSDERMAL | 3 refills | Status: DC

## 2019-08-21 MED ORDER — OLOPATADINE HCL 0.2 % OP SOLN: OPHTHALMIC | 6 refills | Status: DC

## 2019-08-21 MED ORDER — GABAPENTIN 300 MG PO CAPS: 300.0000 mg | ORAL_CAPSULE | Freq: Every day | ORAL | 3 refills | Status: DC

## 2019-08-21 MED ORDER — IBUPROFEN 800 MG PO TABS: 800.0000 mg | ORAL_TABLET | Freq: Three times a day (TID) | ORAL | 3 refills | Status: DC | PRN

## 2019-08-21 MED FILL — DICLOFENAC SODIUM 1% GEL: 1 | 12 days supply | Qty: 100 | Fill #0

## 2019-08-21 MED FILL — GABAPENTIN 300 MG CAPSULE: 300 | 30 days supply | Qty: 30 | Fill #0

## 2019-08-21 MED FILL — IBUPROFEN 800 MG TABLET: 800 | 10 days supply | Qty: 30 | Fill #0

## 2019-08-21 MED FILL — CYCLOBENZAPRINE 10 MG TAB: 10 | 10 days supply | Qty: 30 | Fill #0

## 2019-08-21 MED FILL — OLOPATADINE HCL 0.2% EYE DR: 0.2 | 18 days supply | Qty: 3 | Fill #0

## 2019-08-21 NOTE — Progress Notes (Signed)
Patient [facility]   Established Patient Office Visit  Subjective:  Patient ID: Deanna Floyd, female    DOB: 10/15/1961  Age: 58 y.o. MRN: [id number]  CC:  Chief Complaint  Patient presents with  . Follow-up    Follow up RUQ pain & HTN    HPI Deanna Floyd is a 58 year old female who presents for Follow Up today.   Past Medical History:  Diagnosis Date  . Hypertension   . Stroke [facility]) 2007   Current Status: Since her last office visit, she is experiencing increased pain in her legs today. She is not taking any pain medications for relief of her. she is s/p Stroke history of 2007. She has generalized residual weakness. She denies visual changes, chest pain, cough, shortness of breath, heart palpitations, and falls. She has occasional headaches and dizziness with position changes. Denies severe headaches, confusion, seizures, double vision, and blurred vision, nausea and vomiting. She denies fevers, chills, fatigue, recent infections, weight loss, and night sweats.  No reports of GI problems such as  diarrhea, and constipation. She has no reports of blood in stools, dysuria and hematuria. No depression or anxiety reported today.   Past Surgical History:  Procedure Laterality Date  . FOOT SURGERY  2015   left side had 2 surgeries in 2015  . TRIGGER FINGER RELEASE  2010    Family History  Problem Relation Age of Onset  . Hypertension Sister   . Colon cancer Neg Hx   . Stomach cancer Neg Hx     Social History   Socioeconomic History  . Marital status: Single    Spouse name: Not on file  . Number of children: Not on file  . Years of education: Not on file  . Highest education level: Not on file  Occupational History  . Not on file  Social Needs  . Financial resource strain: Not on file  . Food insecurity    Worry: Not on file    Inability: Not on file  . Transportation needs    Medical: Not on file    Non-medical: Not  on file  Tobacco Use  . Smoking status: Former Research scientist (life sciences)  . Smokeless tobacco: Never Used  Substance and Sexual Activity  . Alcohol use: Yes    Comment: occ wine   . Drug use: No  . Sexual activity: Never  Lifestyle  . Physical activity    Days per week: Not on file    Minutes per session: Not on file  . Stress: Not on file  Relationships  . Social Herbalist on phone: Not on file    Gets together: Not on file    Attends religious service: Not on file    Active member of club or organization: Not on file    Attends meetings of clubs or organizations: Not on file    Relationship status: Not on file  . Intimate partner violence    Fear of current or ex partner: Not on file    Emotionally abused: Not on file    Physically abused: Not on file    Forced sexual activity: Not on file  Other Topics Concern  . Not on file  Social History Narrative  . Not on file    Outpatient Medications Prior to Visit  Medication Sig Dispense Refill  . acetaminophen (TYLENOL) 500 MG tablet Take 1 tablet (500 mg total) by mouth every 6 (six) hours  as needed. 30 tablet 3  . amLODipine (NORVASC) 5 MG tablet Take 1 tablet (5 mg total) by mouth daily. 30 tablet 3  . lisinopril-hydrochlorothiazide (ZESTORETIC) 20-12.5 MG tablet Take 1 tablet by mouth daily. 30 tablet 3  . Multiple Vitamin (MULTIVITAMIN WITH MINERALS) TABS tablet Take 1 tablet by mouth daily.    Marland Kitchen omeprazole (PRILOSEC) 40 MG capsule TAKE 1 CAPSULE (40 MG TOTAL) BY MOUTH DAILY. 30 capsule 3  . diclofenac sodium (VOLTAREN) 1 % GEL Apply 2 g topically 4 (four) times daily. Rub into affected area of foot 2 to 4 times daily 100 g 3  . gabapentin (NEURONTIN) 300 MG capsule Take 1 capsule (300 mg total) by mouth at bedtime. As needed. 30 capsule 3  . diphenhydrAMINE (BENADRYL) 2 % cream Apply topically 3 (three) times daily as needed for itching. (Patient not taking: Reported on 05/08/2019) 30 g 3  . cetirizine (ZYRTEC) 10 MG tablet Take 1  tablet (10 mg total) by mouth daily. (Patient not taking: Reported on 11/06/2018) 30 tablet 11  . meloxicam (MOBIC) 15 MG tablet Take 1 tablet (15 mg total) by mouth daily. 30 tablet 2   Facility-Administered Medications Prior to Visit  Medication Dose Route Frequency Provider Last Rate Last Dose  . 0.9 %  sodium chloride infusion  500 mL Intravenous Once Doran Stabler, MD        Allergies  Allergen Reactions  . Hydrocortisone Hives  . Lyrica [Pregabalin] Rash    ROS Review of Systems  Constitutional: Negative.   HENT: Negative.   Eyes: Negative.   Respiratory: Negative.   Cardiovascular: Negative.   Gastrointestinal: Negative.   Endocrine: Negative.   Genitourinary: Negative.   Musculoskeletal: Positive for back pain (chronic back pain).       Left foot pain  Skin: Negative.   Allergic/Immunologic: Negative.   Neurological: Positive for dizziness and headaches.  Hematological: Negative.   Psychiatric/Behavioral: Negative.       Objective:    Physical Exam  Constitutional: She is oriented to person, place, and time. She appears well-developed and well-nourished.  HENT:  Head: Normocephalic and atraumatic.  Eyes: Conjunctivae are normal.  Neck: Normal range of motion. Neck supple.  Cardiovascular: Normal rate, regular rhythm, normal heart sounds and intact distal pulses.  Pulmonary/Chest: Effort normal and breath sounds normal.  Abdominal: Soft. Bowel sounds are normal.  Musculoskeletal:     Comments: Limited ROM in left foot and spine  Neurological: She is alert and oriented to person, place, and time.  Skin: Skin is warm and dry.  Psychiatric: She has a normal mood and affect. Her behavior is normal. Judgment and thought content normal.  Nursing note and vitals reviewed.   BP 119/72 (BP Location: Left Arm, Patient Position: Sitting, Cuff Size: Large)   Pulse 79   Temp 97.9 F (36.6 C) (Oral)   Ht 5\' 1"  (1.549 m)   Wt 218 lb 12.8 oz (99.2 kg)   BMI  41.34 kg/m  Wt Readings from Last 3 Encounters:  08/21/19 218 lb 12.8 oz (99.2 kg)  05/08/19 215 lb (97.5 kg)  02/05/19 210 lb (95.3 kg)     Health Maintenance Due  Topic Date Due  . INFLUENZA VACCINE  06/09/2019  . PAP SMEAR-Modifier  08/11/2019    There are no preventive care reminders to display for this patient.  Lab Results  Component Value Date   TSH 1.480 02/05/2019   Lab Results  Component Value Date   WBC  4.9 02/05/2019   HGB 12.6 02/05/2019   HCT 36.7 02/05/2019   MCV 93 02/05/2019   PLT 294 02/05/2019   Lab Results  Component Value Date   NA 141 02/05/2019   K 4.2 02/05/2019   CO2 22 02/05/2019   GLUCOSE 94 02/05/2019   BUN 14 02/05/2019   CREATININE 0.74 02/05/2019   BILITOT 0.4 02/05/2019   ALKPHOS 78 02/05/2019   AST 15 02/05/2019   ALT 24 02/05/2019   PROT 7.5 02/05/2019   ALBUMIN 4.7 02/05/2019   CALCIUM 10.0 02/05/2019   ANIONGAP 6 06/04/2015   Lab Results  Component Value Date   CHOL 177 02/05/2019   Lab Results  Component Value Date   HDL 86 02/05/2019   Lab Results  Component Value Date   LDLCALC 75 02/05/2019   Lab Results  Component Value Date   TRIG 82 02/05/2019   Lab Results  Component Value Date   CHOLHDL 2.1 02/05/2019   Lab Results  Component Value Date   HGBA1C 5.3 08/01/2018      Assessment & Plan:   1. Essential hypertension The current medical regimen is effective; blood pressure is stable at 119/72 today; continue present plan and medications as prescribed. She will continue to take medications as prescribed, to decrease high sodium intake, excessive alcohol intake, increase potassium intake, smoking cessation, and increase physical activity of at least 30 minutes of cardio activity daily. She will continue to follow Heart Healthy or DASH diet. - POCT urinalysis dipstick  2. Class 3 severe obesity due to excess calories with serious comorbidity and body mass index (BMI) of 40.0 to 44.9 in adult [facility]) Body  mass index is 41.34 kg/m. Goal BMI  is <30. Encouraged efforts to reduce weight include engaging in physical activity as tolerated with goal of 150 minutes per week. Improve dietary choices and eat a meal regimen consistent with a Mediterranean or DASH diet. Reduce simple carbohydrates. Do not skip meals and eat healthy snacks throughout the day to avoid over-eating at dinner. Set a goal weight loss that is achievable for you.  3. Neuropathy - gabapentin (NEURONTIN) 300 MG capsule; Take 1 capsule (300 mg total) by mouth at bedtime. As needed.  Dispense: 30 capsule; Refill: 3  4. Hot flashes due to menopause We will initiate Gabapentin today.  - gabapentin (NEURONTIN) 300 MG capsule; Take 1 capsule (300 mg total) by mouth at bedtime. As needed.  Dispense: 30 capsule; Refill: 3  5. Chronic bilateral low back pain with bilateral sciatica - ibuprofen (ADVIL) 800 MG tablet; Take 1 tablet (800 mg total) by mouth every 8 (eight) hours as needed.  Dispense: 30 tablet; Refill: 3 - cyclobenzaprine (FLEXERIL) 10 MG tablet; Take 1 tablet (10 mg total) by mouth 3 (three) times daily as needed for muscle spasms.  Dispense: 30 tablet; Refill: 3  6. Chronic foot pain, left We will initiate Motrin today.  - ibuprofen (ADVIL) 800 MG tablet; Take 1 tablet (800 mg total) by mouth every 8 (eight) hours as needed.  Dispense: 30 tablet; Refill: 3 - cyclobenzaprine (FLEXERIL) 10 MG tablet; Take 1 tablet (10 mg total) by mouth 3 (three) times daily as needed for muscle spasms.  Dispense: 30 tablet; Refill: 3  7. Seasonal allergies We will initiate antihistamine and eye drops today.  - cetirizine (ZYRTEC) 10 MG tablet; Take 1 tablet (10 mg total) by mouth daily.  Dispense: 30 tablet; Refill: 11 - Olopatadine HCl 0.2 % SOLN; One drop into each eye  daily, as needed.  Dispense: 2.5 mL; Refill: 6  8. Flu vaccine need - Flu Vaccine QUAD 6+ mos PF IM (Fluarix Quad PF)  9. Follow up She will follow up in 6 months.    Meds ordered this encounter  Medications  . ibuprofen (ADVIL) 800 MG tablet    Sig: Take 1 tablet (800 mg total) by mouth every 8 (eight) hours as needed.    Dispense:  30 tablet    Refill:  3  . cyclobenzaprine (FLEXERIL) 10 MG tablet    Sig: Take 1 tablet (10 mg total) by mouth 3 (three) times daily as needed for muscle spasms.    Dispense:  30 tablet    Refill:  3  . diclofenac sodium (VOLTAREN) 1 % GEL    Sig: Apply 2 g topically 4 (four) times daily. Rub into affected area of foot 2 to 4 times daily    Dispense:  100 g    Refill:  3  . gabapentin (NEURONTIN) 300 MG capsule    Sig: Take 1 capsule (300 mg total) by mouth at bedtime. As needed.    Dispense:  30 capsule    Refill:  3  . cetirizine (ZYRTEC) 10 MG tablet    Sig: Take 1 tablet (10 mg total) by mouth daily.    Dispense:  30 tablet    Refill:  11  . Olopatadine HCl 0.2 % SOLN    Sig: One drop into each eye daily, as needed.    Dispense:  2.5 mL    Refill:  6    Orders Placed This Encounter  Procedures  . Flu Vaccine QUAD 6+ mos PF IM (Fluarix Quad PF)  . POCT urinalysis dipstick    Referral Orders  No referral(s) requested today    Kathe Becton,  MSN, FNP-BC [place] [address], [place] [address] [phone] fax   Problem List Items Addressed This Visit      Cardiovascular and Mediastinum   Essential hypertension - Primary   Relevant Orders   POCT urinalysis dipstick (Completed)     Other   Hot flashes due to menopause   Relevant Medications   gabapentin (NEURONTIN) 300 MG capsule   Low back pain   Relevant Medications   ibuprofen (ADVIL) 800 MG tablet   cyclobenzaprine (FLEXERIL) 10 MG tablet   Obesity    Other Visit Diagnoses    Neuropathy       Relevant Medications   gabapentin (NEURONTIN) 300 MG capsule   Chronic foot pain, left       Relevant Medications   ibuprofen (ADVIL) 800  MG tablet   cyclobenzaprine (FLEXERIL) 10 MG tablet   gabapentin (NEURONTIN) 300 MG capsule   Seasonal allergies       Relevant Medications   cetirizine (ZYRTEC) 10 MG tablet   Olopatadine HCl 0.2 % SOLN   Flu vaccine need       Relevant Orders   Flu Vaccine QUAD 6+ mos PF IM (Fluarix Quad PF) (Completed)   Follow up          Meds ordered this encounter  Medications  . ibuprofen (ADVIL) 800 MG tablet    Sig: Take 1 tablet (800 mg total) by mouth every 8 (eight) hours as needed.    Dispense:  30 tablet    Refill:  3  . cyclobenzaprine (FLEXERIL) 10 MG tablet    Sig: Take 1 tablet (  10 mg total) by mouth 3 (three) times daily as needed for muscle spasms.    Dispense:  30 tablet    Refill:  3  . diclofenac sodium (VOLTAREN) 1 % GEL    Sig: Apply 2 g topically 4 (four) times daily. Rub into affected area of foot 2 to 4 times daily    Dispense:  100 g    Refill:  3  . gabapentin (NEURONTIN) 300 MG capsule    Sig: Take 1 capsule (300 mg total) by mouth at bedtime. As needed.    Dispense:  30 capsule    Refill:  3  . cetirizine (ZYRTEC) 10 MG tablet    Sig: Take 1 tablet (10 mg total) by mouth daily.    Dispense:  30 tablet    Refill:  11  . Olopatadine HCl 0.2 % SOLN    Sig: One drop into each eye daily, as needed.    Dispense:  2.5 mL    Refill:  6    Follow-up: Return in about 6 months (around 02/19/2020).    Azzie Glatter, FNP

## 2019-08-21 NOTE — Patient Instructions (Addendum)
Cyclobenzaprine tablets What is this medicine? CYCLOBENZAPRINE (sye kloe BEN za preen) is a muscle relaxer. It is used to treat muscle pain, spasms, and stiffness. This medicine may be used for other purposes; ask your health care provider or pharmacist if you have questions. COMMON BRAND NAME(S): Fexmid, Flexeril What should I tell my health care provider before I take this medicine? They need to know if you have any of these conditions:  heart disease, irregular heartbeat, or previous heart attack  liver disease  thyroid problem  an unusual or allergic reaction to cyclobenzaprine, tricyclic antidepressants, lactose, other medicines, foods, dyes, or preservatives  pregnant or trying to get pregnant  breast-feeding How should I use this medicine? Take this medicine by mouth with a glass of water. Follow the directions on the prescription label. If this medicine upsets your stomach, take it with food or milk. Take your medicine at regular intervals. Do not take it more often than directed. Talk to your pediatrician regarding the use of this medicine in children. Special care may be needed. Overdosage: If you think you have taken too much of this medicine contact a poison control center or emergency room at once. NOTE: This medicine is only for you. Do not share this medicine with others. What if I miss a dose? If you miss a dose, take it as soon as you can. If it is almost time for your next dose, take only that dose. Do not take double or extra doses. What may interact with this medicine? Do not take this medicine with any of the following medications:  MAOIs like Carbex, Eldepryl, Marplan, Nardil, and Parnate  narcotic medicines for cough  safinamide This medicine may also interact with the following medications:  alcohol  bupropion  antihistamines for allergy, cough and cold  certain medicines for anxiety or sleep  certain medicines for bladder problems like oxybutynin,  tolterodine  certain medicines for depression like amitriptyline, fluoxetine, sertraline  certain medicines for Parkinson's disease like benztropine, trihexyphenidyl  certain medicines for seizures like phenobarbital, primidone  certain medicines for stomach problems like dicyclomine, hyoscyamine  certain medicines for travel sickness like scopolamine  general anesthetics like halothane, isoflurane, methoxyflurane, propofol  ipratropium  local anesthetics like lidocaine, pramoxine, tetracaine  medicines that relax muscles for surgery  narcotic medicines for pain  phenothiazines like chlorpromazine, mesoridazine, prochlorperazine, thioridazine  verapamil This list may not describe all possible interactions. Give your health care provider a list of all the medicines, herbs, non-prescription drugs, or dietary supplements you use. Also tell them if you smoke, drink alcohol, or use illegal drugs. Some items may interact with your medicine. What should I watch for while using this medicine? Tell your doctor or health care professional if your symptoms do not start to get better or if they get worse. You may get drowsy or dizzy. Do not drive, use machinery, or do anything that needs mental alertness until you know how this medicine affects you. Do not stand or sit up quickly, especially if you are an older patient. This reduces the risk of dizzy or fainting spells. Alcohol may interfere with the effect of this medicine. Avoid alcoholic drinks. If you are taking another medicine that also causes drowsiness, you may have more side effects. Give your health care provider a list of all medicines you use. Your doctor will tell you how much medicine to take. Do not take more medicine than directed. Call emergency for help if you have problems breathing or unusual sleepiness.  Your mouth may get dry. Chewing sugarless gum or sucking hard candy, and drinking plenty of water may help. Contact your  doctor if the problem does not go away or is severe. What side effects may I notice from receiving this medicine? Side effects that you should report to your doctor or health care professional as soon as possible:  allergic reactions like skin rash, itching or hives, swelling of the face, lips, or tongue  breathing problems  chest pain  fast, irregular heartbeat  hallucinations  seizures  unusually weak or tired Side effects that usually do not require medical attention (report to your doctor or health care professional if they continue or are bothersome):  headache  nausea, vomiting This list may not describe all possible side effects. Call your doctor for medical advice about side effects. You may report side effects to FDA at 1-800-FDA-1088. Where should I keep my medicine? Keep out of the reach of children. Store at room temperature between 15 and 30 degrees C (59 and 86 degrees F). Keep container tightly closed. Throw away any unused medicine after the expiration date. NOTE: This sheet is a summary. It may not cover all possible information. If you have questions about this medicine, talk to your doctor, pharmacist, or health care provider.  2020 Elsevier/Gold Standard (2018-09-27 12:49:26) DASH Eating Plan DASH stands for "Dietary Approaches to Stop Hypertension." The DASH eating plan is a healthy eating plan that has been shown to reduce high blood pressure (hypertension). It may also reduce your risk for type 2 diabetes, heart disease, and stroke. The DASH eating plan may also help with weight loss. What are tips for following this plan?  General guidelines  Avoid eating more than 2,300 mg (milligrams) of salt (sodium) a day. If you have hypertension, you may need to reduce your sodium intake to 1,500 mg a day.  Limit alcohol intake to no more than 1 drink a day for nonpregnant women and 2 drinks a day for men. One drink equals 12 oz of beer, 5 oz of wine, or 1 oz of  hard liquor.  Work with your health care provider to maintain a healthy body weight or to lose weight. Ask what an ideal weight is for you.  Get at least 30 minutes of exercise that causes your heart to beat faster (aerobic exercise) most days of the week. Activities may include walking, swimming, or biking.  Work with your health care provider or diet and nutrition specialist (dietitian) to adjust your eating plan to your individual calorie needs. Reading food labels   Check food labels for the amount of sodium per serving. Choose foods with less than 5 percent of the Daily Value of sodium. Generally, foods with less than 300 mg of sodium per serving fit into this eating plan.  To find whole grains, look for the word "whole" as the first word in the ingredient list. Shopping  Buy products labeled as "low-sodium" or "no salt added."  Buy fresh foods. Avoid canned foods and premade or frozen meals. Cooking  Avoid adding salt when cooking. Use salt-free seasonings or herbs instead of table salt or sea salt. Check with your health care provider or pharmacist before using salt substitutes.  Do not fry foods. Cook foods using healthy methods such as baking, boiling, grilling, and broiling instead.  Cook with heart-healthy oils, such as olive, canola, soybean, or sunflower oil. Meal planning  Eat a balanced diet that includes: ? 5 or more servings of fruits  and vegetables each day. At each meal, try to fill half of your plate with fruits and vegetables. ? Up to 6-8 servings of whole grains each day. ? Less than 6 oz of lean meat, poultry, or fish each day. A 3-oz serving of meat is about the same size as a deck of cards. One egg equals 1 oz. ? 2 servings of low-fat dairy each day. ? A serving of nuts, seeds, or beans 5 times each week. ? Heart-healthy fats. Healthy fats called Omega-3 fatty acids are found in foods such as flaxseeds and coldwater fish, like sardines, salmon, and  mackerel.  Limit how much you eat of the following: ? Canned or prepackaged foods. ? Food that is high in trans fat, such as fried foods. ? Food that is high in saturated fat, such as fatty meat. ? Sweets, desserts, sugary drinks, and other foods with added sugar. ? Full-fat dairy products.  Do not salt foods before eating.  Try to eat at least 2 vegetarian meals each week.  Eat more home-cooked food and less restaurant, buffet, and fast food.  When eating at a restaurant, ask that your food be prepared with less salt or no salt, if possible. What foods are recommended? The items listed may not be a complete list. Talk with your dietitian about what dietary choices are best for you. Grains Whole-grain or whole-wheat bread. Whole-grain or whole-wheat pasta. Brown rice. [place]. Bulgur. Whole-grain and low-sodium cereals. Pita bread. Low-fat, low-sodium crackers. Whole-wheat flour tortillas. Vegetables Fresh or frozen vegetables (raw, steamed, roasted, or grilled). Low-sodium or reduced-sodium tomato and vegetable juice. Low-sodium or reduced-sodium tomato sauce and tomato paste. Low-sodium or reduced-sodium canned vegetables. Fruits All fresh, dried, or frozen fruit. Canned fruit in natural juice (without added sugar). Meat and other protein foods Skinless chicken or [place]. Ground chicken or [place]. Pork with fat trimmed off. Fish and seafood. Egg whites. Dried beans, peas, or lentils. Unsalted nuts, nut butters, and seeds. Unsalted canned beans. Lean cuts of beef with fat trimmed off. Low-sodium, lean deli meat. Dairy Low-fat (1%) or fat-free (skim) milk. Fat-free, low-fat, or reduced-fat cheeses. Nonfat, low-sodium ricotta or cottage cheese. Low-fat or nonfat yogurt. Low-fat, low-sodium cheese. Fats and oils Soft margarine without trans fats. Vegetable oil. Low-fat, reduced-fat, or light mayonnaise and salad dressings (reduced-sodium). Canola, safflower, olive, soybean, and  sunflower oils. Avocado. Seasoning and other foods Herbs. Spices. Seasoning mixes without salt. Unsalted popcorn and pretzels. Fat-free sweets. What foods are not recommended? The items listed may not be a complete list. Talk with your dietitian about what dietary choices are best for you. Grains Baked goods made with fat, such as croissants, muffins, or some breads. Dry pasta or rice meal packs. Vegetables Creamed or fried vegetables. Vegetables in a cheese sauce. Regular canned vegetables (not low-sodium or reduced-sodium). Regular canned tomato sauce and paste (not low-sodium or reduced-sodium). Regular tomato and vegetable juice (not low-sodium or reduced-sodium). Angie Fava. Olives. Fruits Canned fruit in a light or heavy syrup. Fried fruit. Fruit in cream or butter sauce. Meat and other protein foods Fatty cuts of meat. Ribs. Fried meat. Berniece Salines. Sausage. Bologna and other processed lunch meats. Salami. Fatback. Hotdogs. Bratwurst. Salted nuts and seeds. Canned beans with added salt. Canned or smoked fish. Whole eggs or egg yolks. Chicken or [place] with skin. Dairy Whole or 2% milk, cream, and half-and-half. Whole or full-fat cream cheese. Whole-fat or sweetened yogurt. Full-fat cheese. Nondairy creamers. Whipped toppings. Processed cheese and cheese spreads. Fats and  oils Butter. Stick margarine. Lard. Shortening. Ghee. Bacon fat. Tropical oils, such as coconut, palm kernel, or palm oil. Seasoning and other foods Salted popcorn and pretzels. Onion salt, garlic salt, seasoned salt, table salt, and sea salt. Worcestershire sauce. Tartar sauce. Barbecue sauce. Teriyaki sauce. Soy sauce, including reduced-sodium. Steak sauce. Canned and packaged gravies. Fish sauce. Oyster sauce. Cocktail sauce. Horseradish that you find on the shelf. Ketchup. Mustard. Meat flavorings and tenderizers. Bouillon cubes. Hot sauce and Tabasco sauce. Premade or packaged marinades. Premade or packaged taco seasonings.  Relishes. Regular salad dressings. Where to find more information:  National Heart, Lung, and [place]: https://wilson-eaton.com/  American Heart Association: www.heart.org Summary  The DASH eating plan is a healthy eating plan that has been shown to reduce high blood pressure (hypertension). It may also reduce your risk for type 2 diabetes, heart disease, and stroke.  With the DASH eating plan, you should limit salt (sodium) intake to 2,300 mg a day. If you have hypertension, you may need to reduce your sodium intake to 1,500 mg a day.  When on the DASH eating plan, aim to eat more fresh fruits and vegetables, whole grains, lean proteins, low-fat dairy, and heart-healthy fats.  Work with your health care provider or diet and nutrition specialist (dietitian) to adjust your eating plan to your individual calorie needs. This information is not intended to replace advice given to you by your health care provider. Make sure you discuss any questions you have with your health care provider. Document Released: 10/14/2011 Document Revised: 10/07/2017 Document Reviewed: 10/18/2016 Elsevier Patient Education  [place]. Chronic Back Pain When back pain lasts longer than 3 months, it is called chronic back pain. Pain may get worse at certain times (flare-ups). There are things you can do at home to manage your pain. Follow these instructions at home: Activity      Avoid bending and other activities that make pain worse.  When standing: ? Keep your upper back and neck straight. ? Keep your shoulders pulled back. ? Avoid slouching.  When sitting: ? Keep your back straight. ? Relax your shoulders. Do not round your shoulders or pull them backward.  Do not sit or stand in one place for long periods of time.  Take short rest breaks during the day. Lying down or standing is usually better than sitting. Resting can help relieve pain.  When sitting or lying down for a long time, do  some mild activity or stretching. This will help to prevent stiffness and pain.  Get regular exercise. Ask your doctor what activities are safe for you.  Do not lift anything that is heavier than 10 lb (4.5 kg). To prevent injury when you lift things: ? Bend your knees. ? Keep the weight close to your body. ? Avoid twisting. Managing pain  If told, put ice on the painful area. Your doctor may tell you to use ice for 24-48 hours after a flare-up starts. ? Put ice in a plastic bag. ? Place a towel between your skin and the bag. ? Leave the ice on for 20 minutes, 2-3 times a day.  If told, put heat on the painful area as often as told by your doctor. Use the heat source that your doctor recommends, such as a moist heat pack or a heating pad. ? Place a towel between your skin and the heat source. ? Leave the heat on for 20-30 minutes. ? Remove the heat if your skin turns bright red.  This is especially important if you are unable to feel pain, heat, or cold. You may have a greater risk of getting burned.  Soak in a warm bath. This can help relieve pain.  Take over-the-counter and prescription medicines only as told by your doctor. General instructions  Sleep on a firm mattress. Try lying on your side with your knees slightly bent. If you lie on your back, put a pillow under your knees.  Keep all follow-up visits as told by your doctor. This is important. Contact a doctor if:  You have pain that does not get better with rest or medicine. Get help right away if:  One or both of your arms or legs feel weak.  One or both of your arms or legs lose feeling (numbness).  You have trouble controlling when you poop (bowel movement) or pee (urinate).  You feel sick to your stomach (nauseous).  You throw up (vomit).  You have belly (abdominal) pain.  You have shortness of breath.  You pass out (faint). Summary  When back pain lasts longer than 3 months, it is called chronic back  pain.  Pain may get worse at certain times (flare-ups).  Use ice and heat as told by your doctor. Your doctor may tell you to use ice after flare-ups. This information is not intended to replace advice given to you by your health care provider. Make sure you discuss any questions you have with your health care provider. Document Released: 04/12/2008 Document Revised: 02/15/2019 Document Reviewed: 06/09/2017 Elsevier Patient Education  [place]. Ibuprofen tablets and capsules What is this medicine? IBUPROFEN (eye BYOO proe fen) is a non-steroidal anti-inflammatory drug (NSAID). It is used for dental pain, fever, headaches or migraines, osteoarthritis, rheumatoid arthritis, or painful monthly periods. It can also relieve minor aches and pains caused by a cold, flu, or sore throat. This medicine may be used for other purposes; ask your health care provider or pharmacist if you have questions. COMMON BRAND NAME(S): Advil, Advil Junior Strength, Advil Migraine, Genpril, Ibren, IBU, Midol, Midol Cramps and Body Aches, Motrin, Motrin IB, Motrin Junior Strength, Motrin Migraine Pain, Samson-8, Toxicology Saliva Collection What should I tell my health care provider before I take this medicine? They need to know if you have any of these conditions:  cigarette smoker  coronary artery bypass graft (CABG) surgery within the past 2 weeks  drink more than 3 alcohol-containing drinks a day  heart disease  high blood pressure  history of stomach bleeding  kidney disease  liver disease  lung or breathing disease, like asthma  an unusual or allergic reaction to ibuprofen, aspirin, other NSAIDs, other medicines, foods, dyes, or preservatives  pregnant or trying to get pregnant  breast-feeding How should I use this medicine? Take this medicine by mouth with a glass of water. Follow the directions on the prescription label. Take this medicine with food if your stomach gets upset. Try to  not lie down for at least 10 minutes after you take the medicine. Take your medicine at regular intervals. Do not take your medicine more often than directed. A special MedGuide will be given to you by the pharmacist with each prescription and refill. Be sure to read this information carefully each time. Talk to your pediatrician regarding the use of this medicine in children. Special care may be needed. Overdosage: If you think you have taken too much of this medicine contact a poison control center or emergency room at once. NOTE: This medicine  is only for you. Do not share this medicine with others. What if I miss a dose? If you miss a dose, take it as soon as you can. If it is almost time for your next dose, take only that dose. Do not take double or extra doses. What may interact with this medicine? Do not take this medicine with any of the following medications:  cidofovir  ketorolac  methotrexate  pemetrexed This medicine may also interact with the following medications:  alcohol  aspirin  diuretics  lithium  other drugs for inflammation like prednisone  warfarin This list may not describe all possible interactions. Give your health care provider a list of all the medicines, herbs, non-prescription drugs, or dietary supplements you use. Also tell them if you smoke, drink alcohol, or use illegal drugs. Some items may interact with your medicine. What should I watch for while using this medicine? Tell your doctor or healthcare provider if your symptoms do not start to get better or if they get worse. This medicine may cause serious skin reactions. They can happen weeks to months after starting the medicine. Contact your healthcare provider right away if you notice fevers or flu-like symptoms with a rash. The rash may be red or purple and then turn into blisters or peeling of the skin. Or, you might notice a red rash with swelling of the face, lips or lymph nodes in your neck or  under your arms. This medicine does not prevent heart attack or stroke. In fact, this medicine may increase the chance of a heart attack or stroke. The chance may increase with longer use of this medicine and in people who have heart disease. If you take aspirin to prevent heart attack or stroke, talk with your doctor or healthcare provider. Do not take other medicines that contain aspirin, ibuprofen, or naproxen with this medicine. Side effects such as stomach upset, nausea, or ulcers may be more likely to occur. Many medicines available without a prescription should not be taken with this medicine. This medicine can cause ulcers and bleeding in the stomach and intestines at any time during treatment. Ulcers and bleeding can happen without warning symptoms and can cause death. To reduce your risk, do not smoke cigarettes or drink alcohol while you are taking this medicine. You may get drowsy or dizzy. Do not drive, use machinery, or do anything that needs mental alertness until you know how this medicine affects you. Do not stand or sit up quickly, especially if you are an older patient. This reduces the risk of dizzy or fainting spells. This medicine can cause you to bleed more easily. Try to avoid damage to your teeth and gums when you brush or floss your teeth. This medicine may be used to treat migraines. If you take migraine medicines for 10 or more days a month, your migraines may get worse. Keep a diary of headache days and medicine use. Contact your healthcare provider if your migraine attacks occur more frequently. What side effects may I notice from receiving this medicine? Side effects that you should report to your doctor or health care professional as soon as possible:  allergic reactions like skin rash, itching or hives, swelling of the face, lips, or tongue  redness, blistering, peeling or loosening of the skin, including inside the mouth  severe stomach pain  signs and symptoms of  bleeding such as bloody or black, tarry stools; red or dark-brown urine; spitting up blood or brown material that looks like  coffee grounds; red spots on the skin; unusual bruising or bleeding from the eye, gums, or nose  signs and symptoms of a blood clot such as changes in vision; chest pain; severe, sudden headache; trouble speaking; sudden numbness or weakness of the face, arm, or leg  unexplained weight gain or swelling  unusually weak or tired  yellowing of eyes or skin Side effects that usually do not require medical attention (report to your doctor or health care professional if they continue or are bothersome):  bruising  diarrhea  dizziness, drowsiness  headache  nausea, vomiting This list may not describe all possible side effects. Call your doctor for medical advice about side effects. You may report side effects to FDA at 1-800-FDA-1088. Where should I keep my medicine? Keep out of the reach of children. Store at room temperature between 15 and 30 degrees C (59 and 86 degrees F). Keep container tightly closed. Throw away any unused medicine after the expiration date. NOTE: This sheet is a summary. It may not cover all possible information. If you have questions about this medicine, talk to your doctor, pharmacist, or health care provider.  2020 Elsevier/Gold Standard (2019-01-10 14:11:00) Allergies, Adult An allergy is when your body's defense system (immune system) overreacts to an otherwise harmless substance (allergen) that you breathe in or eat or something that touches your skin. When you come into contact with something that you are allergic to, your immune system produces certain proteins (antibodies). These proteins cause cells to release chemicals (histamines) that trigger the symptoms of an allergic reaction. Allergies often affect the nasal passages (allergic rhinitis), eyes (allergic conjunctivitis), skin (atopic dermatitis), and stomach. Allergies can be mild or  severe. Allergies cannot spread from person to person (are not contagious). They can develop at any age and may be outgrown. What increases the risk? You may be at greater risk of allergies if other people in your family have allergies. What are the signs or symptoms? Symptoms depend on what type of allergy you have. They may include:  Runny, stuffy nose.  Sneezing.  Itchy mouth, ears, or throat.  Postnasal drip.  Sore throat.  Itchy, red, watery, or puffy eyes.  Skin rash or hives.  Stomach pain.  Vomiting.  Diarrhea.  Bloating.  Wheezing or coughing. People with a severe allergy to food, medicine, or an insect bite may have a life-threatening allergic reaction (anaphylaxis). Symptoms of anaphylaxis include:  Hives.  Itching.  Flushed face.  Swollen lips, tongue, or mouth.  Tight or swollen throat.  Chest pain or tightness in the chest.  Trouble breathing or shortness of breath.  Rapid heartbeat.  Dizziness or fainting.  Vomiting.  Diarrhea.  Pain in the abdomen. How is this diagnosed? This condition is diagnosed based on:  Your symptoms.  Your family and medical history.  A physical exam. You may need to see a health care provider who specializes in treating allergies (allergist). You may also have tests, including:  Skin tests to see which allergens are causing your symptoms, such as: ? Skin prick test. In this test, your skin is pricked with a tiny needle and exposed to small amounts of possible allergens to see if your skin reacts. ? Intradermal skin test. In this test, a small amount of allergen is injected under your skin to see if your skin reacts. ? Patch test. In this test, a small amount of allergen is placed on your skin and then your skin is covered with a bandage. Your health  care provider will check your skin after a couple of days to see if a rash has developed.  Blood tests.  Challenges tests. In this test, you inhale a small  amount of allergen by mouth to see if you have an allergic reaction. You may also be asked to:  Keep a food diary. A food diary is a record of all the foods and drinks you have in a day and any symptoms you experience.  Practice an elimination diet. An elimination diet involves eliminating specific foods from your diet and then adding them back in one by one to find out if a certain food causes an allergic reaction. How is this treated? Treatment for allergies depends on your symptoms. Treatment may include:  Cold compresses to soothe itching and swelling.  Eye drops.  Nasal sprays.  Using a saline spray or container (neti pot) to flush out the nose (nasal irrigation). These methods can help clear away mucus and keep the nasal passages moist.  Using a humidifier.  Oral antihistamines or other medicines to block allergic reaction and inflammation.  Skin creams to treat rashes or itching.  Diet changes to eliminate food allergy triggers.  Repeated exposure to tiny amounts of allergens to build up a tolerance and prevent future allergic reactions (immunotherapy). These include: ? Allergy shots. ? Oral treatment. This involves taking small doses of an allergen under the tongue (sublingual immunotherapy).  Emergency epinephrine injection (auto-injector) in case of an allergic emergency. This is a self-injectable, pre-measured medicine that must be given within the first few minutes of a serious allergic reaction. Follow these instructions at home:         Avoid known allergens whenever possible.  If you suffer from airborne allergens, wash out your nose daily. You can do this with a saline spray or a neti pot to flush out your nose (nasal irrigation).  Take over-the-counter and prescription medicines only as told by your health care provider.  Keep all follow-up visits as told by your health care provider. This is important.  If you are at risk of a severe allergic reaction  (anaphylaxis), keep your auto-injector with you at all times.  If you have ever had anaphylaxis, wear a medical alert bracelet or necklace that states you have a severe allergy. Contact a health care provider if:  Your symptoms do not improve with treatment. Get help right away if:  You have symptoms of anaphylaxis, such as: ? Swollen mouth, tongue, or throat. ? Pain or tightness in your chest. ? Trouble breathing or shortness of breath. ? Dizziness or fainting. ? Severe abdominal pain, vomiting, or diarrhea. This information is not intended to replace advice given to you by your health care provider. Make sure you discuss any questions you have with your health care provider. Document Released: 01/18/2003 Document Revised: 01/18/2018 Document Reviewed: 05/12/2016 Elsevier Patient Education  [place]. Olopatadine eye solution What is this medicine? OLOPATADINE (oh loe pa TA deen) drops are used in the eye to treat symptoms caused by allergies. This medicine may be used for other purposes; ask your health care provider or pharmacist if you have questions. COMMON BRAND NAME(S): Pataday, Patanol, Pazeo What should I tell my health care provider before I take this medicine? They need to know if you have any of these conditions:  wear contact lenses  an unusual or allergic reaction to olopatadine, other medicines, foods, dyes, or preservatives  pregnant or trying to get pregnant  breast-feeding How should I  use this medicine? This medicine is only for use in the eye. Do not take by mouth. Follow the directions on the prescription label. Wash hands before and after use. Tilt the head back slightly and pull down the lower lid with the index finger to form a pouch. Try not to touch the tip of the dropper to your eye, fingertips, or any other surface. Place the prescribed number of drops into the pouch. Close the eye gently to spread the drops. Your vision may blur for a few  minutes. Use your doses at regular intervals. Do not use your medicine more often than directed. Talk to your pediatrician regarding the use of this medicine in children. While this drug may be prescribed for children as young as 49 years of age for selected conditions, precautions do apply. Overdosage: If you think you have taken too much of this medicine contact a poison control center or emergency room at once. NOTE: This medicine is only for you. Do not share this medicine with others. What if I miss a dose? If you miss a dose, use it as soon as you can. If it is almost time for your next dose, use only that dose. Do not use double or extra doses. What may interact with this medicine? Interactions are not expected. Do not use any other eye products without telling your doctor or health care professional. This list may not describe all possible interactions. Give your health care provider a list of all the medicines, herbs, non-prescription drugs, or dietary supplements you use. Also tell them if you smoke, drink alcohol, or use illegal drugs. Some items may interact with your medicine. What should I watch for while using this medicine? Tell your doctor or healthcare professional if your symptoms do not start to get better or if they get worse. Stop using this medicine if your eyes get swollen, painful, or have a discharge, and see your doctor or health care professional as soon as you can. Do not wear contact lenses if your eyes are red. This medicine should not be used to treat irritation that is caused by contact lenses. Remove soft contact lenses before using this medicine. Contact lenses may be inserted 10 minutes after putting the medicine in the eye. What side effects may I notice from receiving this medicine? Side effects that you should report to your doctor or health care professional as soon as possible:  allergic reactions like skin rash, itching or hives, swelling of the face, lips, or  tongue  eye pain, swelling, or increased redness Side effects that usually do not require medical attention (report to your doctor or health care professional if they continue or are bothersome):  burning, discomfort, stinging, or tearing immediately after use  dry eyes  headache  runny or stuffy nose This list may not describe all possible side effects. Call your doctor for medical advice about side effects. You may report side effects to FDA at 1-800-FDA-1088. Where should I keep my medicine? Keep out of reach of children. Store between 4 and 25 degrees C (39 and 77 degrees F). Keep container tightly closed when not in use. Protect from light. Throw away any unused medicine after the expiration date. NOTE: This sheet is a summary. It may not cover all possible information. If you have questions about this medicine, talk to your doctor, pharmacist, or health care provider.  2020 Elsevier/Gold Standard (2016-06-23 15:45:49)

## 2019-09-03 ENCOUNTER — Other Ambulatory Visit: Payer: Self-pay | Admitting: Family Medicine

## 2019-09-03 DIAGNOSIS — Z1231 Encounter for screening mammogram for malignant neoplasm of breast: Secondary | ICD-10-CM

## 2019-09-03 MED FILL — LISINOPRIL-HCTZ 20-12.5 MG: 20-12.5 | 30 days supply | Qty: 30 | Fill #3

## 2019-09-03 MED FILL — AMLODIPINE BESYLATE 5 MG TA: 5 | 30 days supply | Qty: 30 | Fill #3

## 2019-09-04 ENCOUNTER — Telehealth: Payer: Self-pay | Admitting: Family Medicine

## 2019-09-04 ENCOUNTER — Other Ambulatory Visit: Payer: Self-pay

## 2019-09-04 DIAGNOSIS — I10 Essential (primary) hypertension: Secondary | ICD-10-CM

## 2019-09-04 MED ORDER — LISINOPRIL-HYDROCHLOROTHIAZIDE 20-12.5 MG PO TABS: 1.0000 | ORAL_TABLET | Freq: Every day | ORAL | 6 refills | Status: DC

## 2019-09-06 NOTE — Telephone Encounter (Signed)
done

## 2019-09-21 MED FILL — LATANOPROST 0.005% EYE DRP: 0.005 | 18 days supply | Qty: 3 | Fill #1

## 2019-09-27 ENCOUNTER — Other Ambulatory Visit: Payer: Self-pay | Admitting: Family Medicine

## 2019-09-27 DIAGNOSIS — I10 Essential (primary) hypertension: Secondary | ICD-10-CM

## 2019-09-27 MED FILL — OMEPRAZOLE DR 40 MG CAPSULE: 40 | 30 days supply | Qty: 30 | Fill #1

## 2019-09-27 MED FILL — LISINOPRIL-HCTZ 20-12.5 MG: 20-12.5 | 30 days supply | Qty: 30 | Fill #0

## 2019-09-28 MED FILL — AMLODIPINE BESYLATE 5 MG TA: 5 | 30 days supply | Qty: 30 | Fill #0

## 2019-10-19 DIAGNOSIS — H1045 Other chronic allergic conjunctivitis: Secondary | ICD-10-CM | POA: Diagnosis not present

## 2019-10-19 DIAGNOSIS — H0288B Meibomian gland dysfunction left eye, upper and lower eyelids: Secondary | ICD-10-CM | POA: Diagnosis not present

## 2019-10-19 DIAGNOSIS — H401131 Primary open-angle glaucoma, bilateral, mild stage: Secondary | ICD-10-CM | POA: Diagnosis not present

## 2019-10-19 DIAGNOSIS — H0288A Meibomian gland dysfunction right eye, upper and lower eyelids: Secondary | ICD-10-CM | POA: Diagnosis not present

## 2019-10-22 ENCOUNTER — Other Ambulatory Visit: Payer: Self-pay

## 2019-10-22 ENCOUNTER — Ambulatory Visit
Admission: RE | Admit: 2019-10-22 | Discharge: 2019-10-22 | Disposition: A | Discharge status: Home / Self Care | Payer: Medicare HMO | Source: Ambulatory Visit | Attending: Family Medicine | Admitting: Family Medicine

## 2019-10-22 DIAGNOSIS — Z1231 Encounter for screening mammogram for malignant neoplasm of breast: Secondary | ICD-10-CM | POA: Diagnosis not present

## 2019-10-29 ENCOUNTER — Other Ambulatory Visit: Payer: Self-pay | Admitting: Family Medicine

## 2019-10-29 DIAGNOSIS — K2100 Gastro-esophageal reflux disease with esophagitis, without bleeding: Secondary | ICD-10-CM

## 2019-10-29 MED FILL — LATANOPROST 0.005% EYE DRP: 0.005 | 18 days supply | Qty: 3 | Fill #2

## 2019-10-29 MED FILL — AMLODIPINE BESYLATE 5 MG TA: 5 | 90 days supply | Qty: 90 | Fill #1

## 2019-10-29 MED FILL — LISINOPRIL-HCTZ 20-12.5 MG: 20-12.5 | 90 days supply | Qty: 90 | Fill #1

## 2019-10-30 MED FILL — OMEPRAZOLE DR 40 MG CAPSULE: 40 | 30 days supply | Qty: 30 | Fill #0

## 2019-11-20 MED FILL — IBUPROFEN 800 MG TABLET: 800 | 10 days supply | Qty: 30 | Fill #1

## 2019-12-03 MED FILL — OMEPRAZOLE DR 40 MG CAPSULE: 40 | 30 days supply | Qty: 30 | Fill #1

## 2019-12-12 MED FILL — LATANOPROST 0.005% EYE DRP: 0.005 | 18 days supply | Qty: 3 | Fill #3

## 2019-12-26 MED FILL — LATANOPROST 0.005% EYE DRP: 0.005 | 18 days supply | Qty: 3 | Fill #3

## 2019-12-31 MED FILL — OMEPRAZOLE DR 40 MG CAPSULE: 40 | 30 days supply | Qty: 30 | Fill #2

## 2020-01-31 MED FILL — OMEPRAZOLE DR 40 MG CAPSULE: 40 | 30 days supply | Qty: 30 | Fill #3

## 2020-01-31 MED FILL — LATANOPROST 0.005% EYE DRP: 0.005 | 18 days supply | Qty: 3 | Fill #4

## 2020-01-31 MED FILL — LISINOPRIL-HCTZ 20-12.5 MG: 20-12.5 | 90 days supply | Qty: 90 | Fill #2

## 2020-02-01 ENCOUNTER — Other Ambulatory Visit: Payer: Self-pay

## 2020-02-01 DIAGNOSIS — I10 Essential (primary) hypertension: Secondary | ICD-10-CM

## 2020-02-01 MED ORDER — AMLODIPINE BESYLATE 5 MG PO TABS: 5.0000 mg | ORAL_TABLET | Freq: Every day | ORAL | 3 refills | Status: DC

## 2020-02-01 MED FILL — AMLODIPINE BESYLATE 5 MG TA: 5 | 90 days supply | Qty: 90 | Fill #0

## 2020-02-19 ENCOUNTER — Ambulatory Visit: Payer: Medicare HMO | Admitting: Family Medicine

## 2020-03-04 ENCOUNTER — Ambulatory Visit: Payer: Medicare HMO | Admitting: Family Medicine

## 2020-03-05 ENCOUNTER — Other Ambulatory Visit: Payer: Self-pay | Admitting: Family Medicine

## 2020-03-05 DIAGNOSIS — K2100 Gastro-esophageal reflux disease with esophagitis, without bleeding: Secondary | ICD-10-CM

## 2020-03-06 MED FILL — OMEPRAZOLE DR 40 MG CAPSULE: 40 | 30 days supply | Qty: 30 | Fill #0

## 2020-03-12 ENCOUNTER — Ambulatory Visit (INDEPENDENT_AMBULATORY_CARE_PROVIDER_SITE_OTHER): Payer: Medicare HMO | Admitting: Family Medicine

## 2020-03-12 ENCOUNTER — Other Ambulatory Visit: Payer: Self-pay | Admitting: Family Medicine

## 2020-03-12 ENCOUNTER — Encounter: Payer: Self-pay | Admitting: Family Medicine

## 2020-03-12 ENCOUNTER — Other Ambulatory Visit: Payer: Self-pay

## 2020-03-12 ENCOUNTER — Ambulatory Visit (HOSPITAL_COMMUNITY)
Admission: RE | Admit: 2020-03-12 | Discharge: 2020-03-12 | Disposition: A | Discharge status: Home / Self Care | Payer: Medicare HMO | Source: Ambulatory Visit | Attending: Family Medicine | Admitting: Family Medicine

## 2020-03-12 VITALS — BP 133/84 | HR 81 | Temp 98.0°F | Ht 61.0 in | Wt 223.2 lb

## 2020-03-12 DIAGNOSIS — G629 Polyneuropathy, unspecified: Secondary | ICD-10-CM

## 2020-03-12 DIAGNOSIS — Z0000 Encounter for general adult medical examination without abnormal findings: Secondary | ICD-10-CM | POA: Diagnosis not present

## 2020-03-12 DIAGNOSIS — M7989 Other specified soft tissue disorders: Secondary | ICD-10-CM | POA: Diagnosis not present

## 2020-03-12 DIAGNOSIS — M5442 Lumbago with sciatica, left side: Secondary | ICD-10-CM | POA: Diagnosis not present

## 2020-03-12 DIAGNOSIS — K219 Gastro-esophageal reflux disease without esophagitis: Secondary | ICD-10-CM

## 2020-03-12 DIAGNOSIS — N951 Menopausal and female climacteric states: Secondary | ICD-10-CM | POA: Diagnosis not present

## 2020-03-12 DIAGNOSIS — G8929 Other chronic pain: Secondary | ICD-10-CM

## 2020-03-12 DIAGNOSIS — Z09 Encounter for follow-up examination after completed treatment for conditions other than malignant neoplasm: Secondary | ICD-10-CM

## 2020-03-12 DIAGNOSIS — M25531 Pain in right wrist: Secondary | ICD-10-CM | POA: Insufficient documentation

## 2020-03-12 DIAGNOSIS — I10 Essential (primary) hypertension: Secondary | ICD-10-CM | POA: Diagnosis not present

## 2020-03-12 DIAGNOSIS — T7840XA Allergy, unspecified, initial encounter: Secondary | ICD-10-CM

## 2020-03-12 DIAGNOSIS — J302 Other seasonal allergic rhinitis: Secondary | ICD-10-CM

## 2020-03-12 DIAGNOSIS — M79672 Pain in left foot: Secondary | ICD-10-CM

## 2020-03-12 DIAGNOSIS — E66813 Obesity, class 3: Secondary | ICD-10-CM

## 2020-03-12 DIAGNOSIS — Z6841 Body Mass Index (BMI) 40.0 and over, adult: Secondary | ICD-10-CM

## 2020-03-12 DIAGNOSIS — R21 Rash and other nonspecific skin eruption: Secondary | ICD-10-CM

## 2020-03-12 DIAGNOSIS — M5441 Lumbago with sciatica, right side: Secondary | ICD-10-CM | POA: Diagnosis not present

## 2020-03-12 LAB — POCT URINALYSIS DIPSTICK
Bilirubin, UA: NEGATIVE
Blood, UA: NEGATIVE
Glucose, UA: NEGATIVE
Ketones, UA: NEGATIVE
Leukocytes, UA: NEGATIVE
Nitrite, UA: NEGATIVE
Protein, UA: NEGATIVE
Spec Grav, UA: 1.025 (ref 1.010–1.025)
Urobilinogen, UA: 0.2 E.U./dL
pH, UA: 5.5 (ref 5.0–8.0)

## 2020-03-12 LAB — POCT GLYCOSYLATED HEMOGLOBIN (HGB A1C): Hemoglobin A1C: 5.4 % (ref 4.0–5.6)

## 2020-03-12 LAB — GLUCOSE, POCT (MANUAL RESULT ENTRY): POC Glucose: 108 mg/dl — AB (ref 70–99)

## 2020-03-12 MED ORDER — AMLODIPINE BESYLATE 5 MG PO TABS: 5.0000 mg | ORAL_TABLET | Freq: Every day | ORAL | 6 refills | Status: DC

## 2020-03-12 MED ORDER — ACETAMINOPHEN 500 MG PO TABS: 500.0000 mg | ORAL_TABLET | Freq: Four times a day (QID) | ORAL | 6 refills | Status: DC | PRN

## 2020-03-12 MED ORDER — LISINOPRIL-HYDROCHLOROTHIAZIDE 20-12.5 MG PO TABS: 1.0000 | ORAL_TABLET | Freq: Every day | ORAL | 6 refills | Status: DC

## 2020-03-12 MED ORDER — DIPHENHYDRAMINE HCL 2 % EX CREA: TOPICAL_CREAM | Freq: Three times a day (TID) | CUTANEOUS | 6 refills | Status: DC | PRN

## 2020-03-12 MED ORDER — OMEPRAZOLE 40 MG PO CPDR: 40.0000 mg | DELAYED_RELEASE_CAPSULE | Freq: Every day | ORAL | 6 refills | Status: DC

## 2020-03-12 MED ORDER — CYCLOBENZAPRINE HCL 10 MG PO TABS: 10.0000 mg | ORAL_TABLET | Freq: Three times a day (TID) | ORAL | 6 refills | Status: DC | PRN

## 2020-03-12 MED ORDER — GABAPENTIN 300 MG PO CAPS: 300.0000 mg | ORAL_CAPSULE | Freq: Two times a day (BID) | ORAL | 6 refills | Status: DC

## 2020-03-12 MED ORDER — CETIRIZINE HCL 10 MG PO TABS: 10.0000 mg | ORAL_TABLET | Freq: Every day | ORAL | 11 refills | Status: DC

## 2020-03-12 MED ORDER — OLOPATADINE HCL 0.2 % OP SOLN: OPHTHALMIC | 6 refills | Status: DC

## 2020-03-12 MED FILL — OLOPATADINE HCL 0.2 % SOLN: 0.2 | 18 days supply | Qty: 3 | Fill #0

## 2020-03-12 MED FILL — CYCLOBENZAPRINE 10 MG TAB: 10 | 10 days supply | Qty: 30 | Fill #0

## 2020-03-12 MED FILL — GABAPENTIN 300 MG CAPSULE: 300 | 30 days supply | Qty: 60 | Fill #0

## 2020-03-12 NOTE — Progress Notes (Signed)
Patient [facility]   Established Patient Office Visit  Subjective:  Patient ID: Deanna Floyd, female    DOB: 03-30-1961  Age: 59 y.o. MRN: [id number]  CC:  Chief Complaint  Patient presents with  . Follow-up    HTM  . Pain    Pain all over the body    HPI Deanna Floyd is a 59 year old female who presents for Follow Up today.   Past Medical History:  Diagnosis Date  . Hypertension   . Stroke [facility]) 2007   Current Status: Since her last office visit, she is concerned about her recent weight gain. She admits to including increased carbs in her diet lately. She is currently on thewaiting list for [employer] exercise group. She has c/o recent hands and wrist joints stiffness and pain. She denies visual changes, chest pain, cough, shortness of breath, heart palpitations, and falls. She has occasional headaches and dizziness with position changes. Denies severe headaches, confusion, seizures, double vision, and blurred vision, nausea and vomiting. She denies fevers, chills, fatigue, recent infections, weight loss, and night sweats. Denies GI problems such as diarrhea, and constipation. She has no reports of blood in stools, dysuria and hematuria. No depression or anxiety reported today. She denies suicidal ideations, homicidal ideations, or auditory hallucinations. She is taking all medications as prescribed.   Past Surgical History:  Procedure Laterality Date  . FOOT SURGERY  2015   left side had 2 surgeries in 2015  . TRIGGER FINGER RELEASE  2010    Family History  Problem Relation Age of Onset  . Hypertension Sister   . Colon cancer Neg Hx   . Stomach cancer Neg Hx     Social History   Socioeconomic History  . Marital status: Single    Spouse name: Not on file  . Number of children: Not on file  . Years of education: Not on file  . Highest education level: Not on file  Occupational History  . Not on file  Tobacco Use   . Smoking status: Former Research scientist (life sciences)  . Smokeless tobacco: Never Used  Substance and Sexual Activity  . Alcohol use: Yes    Comment: occ wine   . Drug use: No  . Sexual activity: Not Currently  Other Topics Concern  . Not on file  Social History Narrative  . Not on file   Social Determinants of Health   Financial Resource Strain:   . Difficulty of Paying Living Expenses:   Food Insecurity:   . Worried About Charity fundraiser in the Last Year:   . Arboriculturist in the Last Year:   Transportation Needs:   . Film/video editor (Medical):   Marland Kitchen Lack of Transportation (Non-Medical):   Physical Activity:   . Days of Exercise per Week:   . Minutes of Exercise per Session:   Stress:   . Feeling of Stress :   Social Connections:   . Frequency of Communication with Friends and Family:   . Frequency of Social Gatherings with Friends and Family:   . Attends Religious Services:   . Active Member of Clubs or Organizations:   . Attends Archivist Meetings:   Marland Kitchen Marital Status:   Intimate Partner Violence:   . Fear of Current or Ex-Partner:   . Emotionally Abused:   Marland Kitchen Physically Abused:   . Sexually Abused:     Outpatient Medications Prior to Visit  Medication Sig Dispense Refill  .  Multiple Vitamin (MULTIVITAMIN WITH MINERALS) TABS tablet Take 1 tablet by mouth daily.    Marland Kitchen acetaminophen (TYLENOL) 500 MG tablet Take 1 tablet (500 mg total) by mouth every 6 (six) hours as needed. 30 tablet 3  . amLODipine (NORVASC) 5 MG tablet Take 1 tablet (5 mg total) by mouth daily. 30 tablet 3  . cetirizine (ZYRTEC) 10 MG tablet Take 1 tablet (10 mg total) by mouth daily. 30 tablet 11  . diphenhydrAMINE (BENADRYL) 2 % cream Apply topically 3 (three) times daily as needed for itching. 30 g 3  . gabapentin (NEURONTIN) 300 MG capsule Take 1 capsule (300 mg total) by mouth at bedtime. As needed. 30 capsule 3  . lisinopril-hydrochlorothiazide (ZESTORETIC) 20-12.5 MG tablet Take 1 tablet by  mouth daily. 30 tablet 6  . Olopatadine HCl 0.2 % SOLN One drop into each eye daily, as needed. 2.5 mL 6  . omeprazole (PRILOSEC) 40 MG capsule TAKE 1 CAPSULE (40 MG TOTAL) BY MOUTH DAILY. 30 capsule 3  . diclofenac sodium (VOLTAREN) 1 % GEL Apply 2 g topically 4 (four) times daily. Rub into affected area of foot 2 to 4 times daily (Patient not taking: Reported on 03/12/2020) 100 g 3  . ibuprofen (ADVIL) 800 MG tablet Take 1 tablet (800 mg total) by mouth every 8 (eight) hours as needed. (Patient not taking: Reported on 03/12/2020) 30 tablet 3  . cyclobenzaprine (FLEXERIL) 10 MG tablet Take 1 tablet (10 mg total) by mouth 3 (three) times daily as needed for muscle spasms. (Patient not taking: Reported on 03/12/2020) 30 tablet 3   Facility-Administered Medications Prior to Visit  Medication Dose Route Frequency Provider Last Rate Last Admin  . 0.9 %  sodium chloride infusion  500 mL Intravenous Once Doran Stabler, MD        Allergies  Allergen Reactions  . Hydrocortisone Hives  . Lyrica [Pregabalin] Rash    ROS Review of Systems  Constitutional: Negative.   HENT: Negative.   Eyes: Negative.   Respiratory: Negative.   Cardiovascular: Negative.   Gastrointestinal: Positive for abdominal distention.  Endocrine: Negative.   Genitourinary: Negative.   Musculoskeletal: Positive for arthralgias (generalized joint pain; wrist pain) and back pain (chronic).  Skin: Negative.   Allergic/Immunologic: Negative.   Neurological: Positive for dizziness (occasional ) and headaches (occasional).  Hematological: Negative.   Psychiatric/Behavioral: Negative.    Objective:    Physical Exam  Constitutional: She is oriented to person, place, and time. She appears well-developed and well-nourished.  HENT:  Head: Normocephalic and atraumatic.  Eyes: Conjunctivae are normal.  Cardiovascular: Normal rate, regular rhythm, normal heart sounds and intact distal pulses.  Pulmonary/Chest: Effort normal and  breath sounds normal.  Abdominal: Soft. Bowel sounds are normal.  Musculoskeletal:     Cervical back: Normal range of motion and neck supple.     Comments: Limited ROM in hand joints.   Neurological: She is alert and oriented to person, place, and time. She has normal reflexes.  Skin: Skin is warm and dry.  Psychiatric: She has a normal mood and affect. Her behavior is normal. Judgment and thought content normal.  Nursing note and vitals reviewed.   BP 133/84   Pulse 81   Temp 98 F (36.7 C)   Ht 5\' 1"  (1.549 m)   Wt 223 lb 3.2 oz (101.2 kg)   SpO2 99%   BMI 42.17 kg/m  Wt Readings from Last 3 Encounters:  03/12/20 223 lb 3.2 oz (101.2 kg)  08/21/19 218 lb 12.8 oz (99.2 kg)  05/08/19 215 lb (97.5 kg)     Health Maintenance Due  Topic Date Due  . COVID-19 Vaccine (1) Never done  . PAP SMEAR-Modifier  08/11/2019    There are no preventive care reminders to display for this patient.  Lab Results  Component Value Date   TSH 1.180 03/12/2020   Lab Results  Component Value Date   WBC 4.3 03/12/2020   HGB 11.8 03/12/2020   HCT 35.2 03/12/2020   MCV 96 03/12/2020   PLT 289 03/12/2020   Lab Results  Component Value Date   NA 138 03/12/2020   K 4.0 03/12/2020   CO2 22 03/12/2020   GLUCOSE 99 03/12/2020   BUN 12 03/12/2020   CREATININE 0.75 03/12/2020   BILITOT 0.3 03/12/2020   ALKPHOS 66 03/12/2020   AST 16 03/12/2020   ALT 16 03/12/2020   PROT 7.4 03/12/2020   ALBUMIN 4.4 03/12/2020   CALCIUM 9.7 03/12/2020   ANIONGAP 6 06/04/2015   Lab Results  Component Value Date   CHOL 192 03/12/2020   Lab Results  Component Value Date   HDL 70 03/12/2020   Lab Results  Component Value Date   LDLCALC 104 (H) 03/12/2020   Lab Results  Component Value Date   TRIG 103 03/12/2020   Lab Results  Component Value Date   CHOLHDL 2.7 03/12/2020   Lab Results  Component Value Date   HGBA1C 5.4 03/12/2020      Assessment & Plan:   We will increase Gabapentin  to 300 mg BID today.   1. Right wrist pain - DG Forearm Right; Future  2. Chronic foot pain, left - cyclobenzaprine (FLEXERIL) 10 MG tablet; Take 1 tablet (10 mg total) by mouth 3 (three) times daily as needed for muscle spasms.  Dispense: 30 tablet; Refill: 6  3. Chronic bilateral low back pain with bilateral sciatica - Rheumatoid Arthritis Profile - cyclobenzaprine (FLEXERIL) 10 MG tablet; Take 1 tablet (10 mg total) by mouth 3 (three) times daily as needed for muscle spasms.  Dispense: 30 tablet; Refill: 6  4. Neuropathy We will increase Gabapentin today. . - acetaminophen (TYLENOL) 500 MG tablet; Take 1 tablet (500 mg total) by mouth every 6 (six) hours as needed.  Dispense: 30 tablet; Refill: 6 - gabapentin (NEURONTIN) 300 MG capsule; Take 1 capsule (300 mg total) by mouth 2 (two) times daily. As needed.  Dispense: 60 capsule; Refill: 6  5. Essential hypertension The current medical regimen is effective; blood prssure is stable at 133/84 today; continue present plan and medications as prescribed. He will continue to take medications as prescribed, to decrease high sodium intake, excessive alcohol intake, increase potassium intake, smoking cessation, and increase physical activity of at least 30 minutes of cardio activity daily. He will continue to follow Heart Healthy or DASH diet. - amLODipine (NORVASC) 5 MG tablet; Take 1 tablet (5 mg total) by mouth daily.  Dispense: 30 tablet; Refill: 6 - lisinopril-hydrochlorothiazide (ZESTORETIC) 20-12.5 MG tablet; Take 1 tablet by mouth daily.  Dispense: 30 tablet; Refill: 6  6. Class 3 severe obesity due to excess calories with serious comorbidity and body mass index (BMI) of 40.0 to 44.9 in adult [facility]) Body mass index is 42.17 kg/m. Goal BMI  is <30. Encouraged efforts to reduce weight include engaging in physical activity as tolerated with goal of 150 minutes per week. Improve dietary choices and eat a meal regimen consistent with a  Mediterranean or DASH  diet. Reduce simple carbohydrates. Do not skip meals and eat healthy snacks throughout the day to avoid over-eating at dinner. Set a goal weight loss that is achievable for you.  7. Hot flashes due to menopause - gabapentin (NEURONTIN) 300 MG capsule; Take 1 capsule (300 mg total) by mouth 2 (two) times daily. As needed.  Dispense: 60 capsule; Refill: 6  8. Seasonal allergies - cetirizine (ZYRTEC) 10 MG tablet; Take 1 tablet (10 mg total) by mouth daily.  Dispense: 30 tablet; Refill: 11 - Olopatadine HCl 0.2 % SOLN; One drop into each eye daily, as needed.  Dispense: 2.5 mL; Refill: 6  9. Rash due to allergy - diphenhydrAMINE (BENADRYL) 2 % cream; Apply topically 3 (three) times daily as needed for itching.  Dispense: 30 g; Refill: 6  10. Gastroesophageal reflux disease with esophagitis - omeprazole (PRILOSEC) 40 MG capsule; Take 1 capsule (40 mg total) by mouth daily.  Dispense: 30 capsule; Refill: 6  11. Healthcare maintenance - POCT urinalysis dipstick - POCT glycosylated hemoglobin (Hb A1C) - POCT glucose (manual entry) - CBC with Differential - Comprehensive metabolic panel - Lipid Panel - TSH - Vitamin B12 - Vitamin D, 25-hydroxy  12. Follow up She will follow up in 6 months.   Meds ordered this encounter  Medications  . acetaminophen (TYLENOL) 500 MG tablet    Sig: Take 1 tablet (500 mg total) by mouth every 6 (six) hours as needed.    Dispense:  30 tablet    Refill:  6  . amLODipine (NORVASC) 5 MG tablet    Sig: Take 1 tablet (5 mg total) by mouth daily.    Dispense:  30 tablet    Refill:  6  . cetirizine (ZYRTEC) 10 MG tablet    Sig: Take 1 tablet (10 mg total) by mouth daily.    Dispense:  30 tablet    Refill:  11  . diphenhydrAMINE (BENADRYL) 2 % cream    Sig: Apply topically 3 (three) times daily as needed for itching.    Dispense:  30 g    Refill:  6  . gabapentin (NEURONTIN) 300 MG capsule    Sig: Take 1 capsule (300 mg total) by  mouth 2 (two) times daily. As needed.    Dispense:  60 capsule    Refill:  6  . lisinopril-hydrochlorothiazide (ZESTORETIC) 20-12.5 MG tablet    Sig: Take 1 tablet by mouth daily.    Dispense:  30 tablet    Refill:  6  . Olopatadine HCl 0.2 % SOLN    Sig: One drop into each eye daily, as needed.    Dispense:  2.5 mL    Refill:  6  . omeprazole (PRILOSEC) 40 MG capsule    Sig: Take 1 capsule (40 mg total) by mouth daily.    Dispense:  30 capsule    Refill:  6  . cyclobenzaprine (FLEXERIL) 10 MG tablet    Sig: Take 1 tablet (10 mg total) by mouth 3 (three) times daily as needed for muscle spasms.    Dispense:  30 tablet    Refill:  6    Orders Placed This Encounter  Procedures  . DG Forearm Right  . Rheumatoid Arthritis Profile  . CBC with Differential  . Comprehensive metabolic panel  . Lipid Panel  . TSH  . Vitamin B12  . Vitamin D, 25-hydroxy  . POCT urinalysis dipstick  . POCT glycosylated hemoglobin (Hb A1C)  . POCT glucose (manual entry)  Referral Orders  No referral(s) requested today    Kathe Becton,  MSN, FNP-BC [place], [place] [address] [phone] fax  Problem List Items Addressed This Visit      Cardiovascular and Mediastinum   Essential hypertension   Relevant Medications   amLODipine (NORVASC) 5 MG tablet   lisinopril-hydrochlorothiazide (ZESTORETIC) 20-12.5 MG tablet     Digestive   Gastroesophageal reflux disease with esophagitis   Relevant Medications   omeprazole (PRILOSEC) 40 MG capsule     Nervous and Auditory   Neuropathy   Relevant Medications   acetaminophen (TYLENOL) 500 MG tablet   gabapentin (NEURONTIN) 300 MG capsule     Other   Chronic foot pain, left   Relevant Medications   acetaminophen (TYLENOL) 500 MG tablet   gabapentin (NEURONTIN) 300 MG capsule   cyclobenzaprine (FLEXERIL) 10 MG tablet   Hot flashes  due to menopause   Relevant Medications   gabapentin (NEURONTIN) 300 MG capsule   Low back pain   Relevant Medications   acetaminophen (TYLENOL) 500 MG tablet   cyclobenzaprine (FLEXERIL) 10 MG tablet   Other Relevant Orders   Rheumatoid Arthritis Profile (Completed)   Obesity   Seasonal allergies   Relevant Medications   cetirizine (ZYRTEC) 10 MG tablet   Olopatadine HCl 0.2 % SOLN    Other Visit Diagnoses    Right wrist pain    -  Primary   Relevant Orders   DG Forearm Right (Completed)   Rash due to allergy       Relevant Medications   diphenhydrAMINE (BENADRYL) 2 % cream   Healthcare maintenance       Relevant Orders   POCT urinalysis dipstick (Completed)   POCT glycosylated hemoglobin (Hb A1C) (Completed)   POCT glucose (manual entry) (Completed)   CBC with Differential (Completed)   Comprehensive metabolic panel (Completed)   Lipid Panel (Completed)   TSH (Completed)   Vitamin B12 (Completed)   Vitamin D, 25-hydroxy (Completed)   Follow up          Meds ordered this encounter  Medications  . acetaminophen (TYLENOL) 500 MG tablet    Sig: Take 1 tablet (500 mg total) by mouth every 6 (six) hours as needed.    Dispense:  30 tablet    Refill:  6  . amLODipine (NORVASC) 5 MG tablet    Sig: Take 1 tablet (5 mg total) by mouth daily.    Dispense:  30 tablet    Refill:  6  . cetirizine (ZYRTEC) 10 MG tablet    Sig: Take 1 tablet (10 mg total) by mouth daily.    Dispense:  30 tablet    Refill:  11  . diphenhydrAMINE (BENADRYL) 2 % cream    Sig: Apply topically 3 (three) times daily as needed for itching.    Dispense:  30 g    Refill:  6  . gabapentin (NEURONTIN) 300 MG capsule    Sig: Take 1 capsule (300 mg total) by mouth 2 (two) times daily. As needed.    Dispense:  60 capsule    Refill:  6  . lisinopril-hydrochlorothiazide (ZESTORETIC) 20-12.5 MG tablet    Sig: Take 1 tablet by mouth daily.    Dispense:  30 tablet    Refill:  6  . Olopatadine HCl 0.2 %  SOLN    Sig: One drop into each eye daily, as needed.  Dispense:  2.5 mL    Refill:  6  . omeprazole (PRILOSEC) 40 MG capsule    Sig: Take 1 capsule (40 mg total) by mouth daily.    Dispense:  30 capsule    Refill:  6  . cyclobenzaprine (FLEXERIL) 10 MG tablet    Sig: Take 1 tablet (10 mg total) by mouth 3 (three) times daily as needed for muscle spasms.    Dispense:  30 tablet    Refill:  6    Follow-up: No follow-ups on file.    Azzie Glatter, FNP

## 2020-03-14 LAB — LIPID PANEL
Chol/HDL Ratio: 2.7 ratio (ref 0.0–4.4)
Cholesterol, Total: 192 mg/dL (ref 100–199)
HDL: 70 mg/dL (ref >39)
LDL Chol Calc (NIH): 104 mg/dL — ABNORMAL HIGH (ref 0–99)
Triglycerides: 103 mg/dL (ref 0–149)
VLDL Cholesterol Cal: 18 mg/dL (ref 5–40)

## 2020-03-14 LAB — CBC WITH DIFFERENTIAL/PLATELET
Basophils Absolute: 0 10*3/uL (ref 0.0–0.2)
Basos: 1 %
EOS (ABSOLUTE): 0.1 10*3/uL (ref 0.0–0.4)
Eos: 3 %
Hematocrit: 35.2 % (ref 34.0–46.6)
Hemoglobin: 11.8 g/dL (ref 11.1–15.9)
Immature Grans (Abs): 0 10*3/uL (ref 0.0–0.1)
Immature Granulocytes: 0 %
Lymphocytes Absolute: 1.7 10*3/uL (ref 0.7–3.1)
Lymphs: 40 %
MCH: 32.1 pg (ref 26.6–33.0)
MCHC: 33.5 g/dL (ref 31.5–35.7)
MCV: 96 fL (ref 79–97)
Monocytes Absolute: 0.5 10*3/uL (ref 0.1–0.9)
Monocytes: 12 %
Neutrophils Absolute: 1.9 10*3/uL (ref 1.4–7.0)
Neutrophils: 44 %
Platelets: 289 10*3/uL (ref 150–450)
RBC: 3.68 x10E6/uL — ABNORMAL LOW (ref 3.77–5.28)
RDW: 13.1 % (ref 11.7–15.4)
WBC: 4.3 10*3/uL (ref 3.4–10.8)

## 2020-03-14 LAB — COMPREHENSIVE METABOLIC PANEL
ALT: 16 IU/L (ref 0–32)
AST: 16 IU/L (ref 0–40)
Albumin/Globulin Ratio: 1.5 (ref 1.2–2.2)
Albumin: 4.4 g/dL (ref 3.8–4.9)
Alkaline Phosphatase: 66 IU/L (ref 39–117)
BUN/Creatinine Ratio: 16 (ref 12–28)
BUN: 12 mg/dL (ref 8–27)
Bilirubin Total: 0.3 mg/dL (ref 0.0–1.2)
CO2: 22 mmol/L (ref 20–29)
Calcium: 9.7 mg/dL (ref 8.7–10.3)
Chloride: 101 mmol/L (ref 96–106)
Creatinine, Ser: 0.75 mg/dL (ref 0.57–1.00)
GFR calc Af Amer: 100 mL/min/{1.73_m2} (ref >59)
GFR calc non Af Amer: 87 mL/min/{1.73_m2} (ref >59)
Globulin, Total: 3 g/dL (ref 1.5–4.5)
Glucose: 99 mg/dL (ref 65–99)
Potassium: 4 mmol/L (ref 3.5–5.2)
Sodium: 138 mmol/L (ref 134–144)
Total Protein: 7.4 g/dL (ref 6.0–8.5)

## 2020-03-14 LAB — VITAMIN D 25 HYDROXY (VIT D DEFICIENCY, FRACTURES): Vit D, 25-Hydroxy: 23.1 ng/mL — ABNORMAL LOW (ref 30.0–100.0)

## 2020-03-14 LAB — RHEUMATOID ARTHRITIS PROFILE
Cyclic Citrullin Peptide Ab: 2 units (ref 0–19)
Rheumatoid fact SerPl-aCnc: 17.2 IU/mL — ABNORMAL HIGH (ref 0.0–13.9)

## 2020-03-14 LAB — VITAMIN B12: Vitamin B-12: 649 pg/mL (ref 232–1245)

## 2020-03-14 LAB — TSH: TSH: 1.18 u[IU]/mL (ref 0.450–4.500)

## 2020-03-18 ENCOUNTER — Other Ambulatory Visit: Payer: Self-pay | Admitting: Optometry

## 2020-03-18 DIAGNOSIS — H1045 Other chronic allergic conjunctivitis: Secondary | ICD-10-CM | POA: Diagnosis not present

## 2020-03-18 DIAGNOSIS — H0288B Meibomian gland dysfunction left eye, upper and lower eyelids: Secondary | ICD-10-CM | POA: Diagnosis not present

## 2020-03-18 DIAGNOSIS — H524 Presbyopia: Secondary | ICD-10-CM | POA: Diagnosis not present

## 2020-03-18 DIAGNOSIS — H401131 Primary open-angle glaucoma, bilateral, mild stage: Secondary | ICD-10-CM | POA: Diagnosis not present

## 2020-03-18 DIAGNOSIS — H0288A Meibomian gland dysfunction right eye, upper and lower eyelids: Secondary | ICD-10-CM | POA: Diagnosis not present

## 2020-03-21 ENCOUNTER — Telehealth: Payer: Self-pay

## 2020-03-21 NOTE — Telephone Encounter (Signed)
Patient has requested a referral to Gynecology.

## 2020-03-22 ENCOUNTER — Other Ambulatory Visit: Payer: Self-pay | Admitting: Family Medicine

## 2020-03-24 ENCOUNTER — Other Ambulatory Visit: Payer: Self-pay | Admitting: Family Medicine

## 2020-03-24 DIAGNOSIS — R109 Unspecified abdominal pain: Secondary | ICD-10-CM

## 2020-03-26 ENCOUNTER — Other Ambulatory Visit: Payer: Self-pay

## 2020-03-26 ENCOUNTER — Ambulatory Visit (INDEPENDENT_AMBULATORY_CARE_PROVIDER_SITE_OTHER): Payer: Medicare HMO | Admitting: Family Medicine

## 2020-03-26 ENCOUNTER — Encounter: Payer: Self-pay | Admitting: Family Medicine

## 2020-03-26 VITALS — BP 133/90 | HR 78 | Temp 98.3°F | Ht 61.0 in | Wt 221.8 lb

## 2020-03-26 DIAGNOSIS — I10 Essential (primary) hypertension: Secondary | ICD-10-CM

## 2020-03-26 DIAGNOSIS — Z09 Encounter for follow-up examination after completed treatment for conditions other than malignant neoplasm: Secondary | ICD-10-CM

## 2020-03-26 DIAGNOSIS — M25531 Pain in right wrist: Secondary | ICD-10-CM

## 2020-03-26 DIAGNOSIS — N951 Menopausal and female climacteric states: Secondary | ICD-10-CM

## 2020-03-26 DIAGNOSIS — G8929 Other chronic pain: Secondary | ICD-10-CM

## 2020-03-26 DIAGNOSIS — Z6841 Body Mass Index (BMI) 40.0 and over, adult: Secondary | ICD-10-CM

## 2020-03-26 DIAGNOSIS — E66813 Obesity, class 3: Secondary | ICD-10-CM

## 2020-03-26 MED ORDER — DICLOFENAC SODIUM 1 % EX GEL: 4.0000 g | Freq: Four times a day (QID) | CUTANEOUS | 6 refills | Status: DC

## 2020-03-26 MED FILL — DICLOFENAC SODIUM 1% GEL: 1 | 12 days supply | Qty: 100 | Fill #0

## 2020-03-26 NOTE — Patient Instructions (Signed)
RICE Therapy for Routine Care of Injuries Many injuries can be cared for with rest, ice, compression, and elevation (RICE therapy). This includes:  Resting the injured part.  Putting ice on the injury.  Putting pressure (compression) on the injury.  Raising the injured part (elevation). Using RICE therapy can help to lessen pain and swelling. Supplies needed:  Ice.  Plastic bag.  Towel.  Elastic bandage.  Pillow or pillows to raise (elevate) your injured body part. How to care for your injury with RICE therapy Rest Limit your normal activities, and try not to use the injured part of your body. You can go back to your normal activities when your doctor says it is okay to do them and you feel okay. Ask your doctor if you should do exercises to help your injury get better. Ice Put ice on the injured area. Do not put ice on your bare skin.  Put ice in a plastic bag.  Place a towel between your skin and the bag.  Leave the ice on for 20 minutes, 2-3 times a day. Use ice on as many days as told by your doctor.  Compression Compression means putting pressure on the injured area. This can be done with an elastic bandage. If an elastic bandage has been put on your injury:  Do not wrap the bandage too tight. Wrap the bandage more loosely if part of your body away from the bandage is blue, swollen, cold, painful, or loses feeling (gets numb).  Take off the bandage and put it on again. Do this every 3-4 hours or as told by your doctor.  See your doctor if the bandage seems to make your problems worse.  Elevation Elevation means keeping the injured area raised. If you can, raise the injured area above your heart or the center of your chest. Contact a doctor if:  You keep having pain and swelling.  Your symptoms get worse. Get help right away if:  You have sudden bad pain at your injury or lower than your injury.  You have redness or more swelling around your injury.  You  have tingling or numbness at your injury or lower than your injury, and it does not go away when you take off the bandage. Summary  Many injuries can be cared for using rest, ice, compression, and elevation (RICE therapy).  You can go back to your normal activities when you feel okay and your doctor says it is okay.  Put ice on the injured area as told by your doctor.  Get help if your symptoms get worse or if you keep having pain and swelling. This information is not intended to replace advice given to you by your health care provider. Make sure you discuss any questions you have with your health care provider. Document Revised: 07/15/2017 Document Reviewed: 07/15/2017 Elsevier Patient Education  2020 [employer]  

## 2020-03-26 NOTE — Progress Notes (Signed)
[facility]   Established Patient Office Visit  Subjective:  Patient ID: Deanna Floyd, female    DOB: 1961/07/04  Age: 60 y.o. MRN: [id number]  CC:  Chief Complaint  Patient presents with  . Wrist Pain    right wrist pain  . Joint Swelling    knot on right elbow    HPI Deanna Floyd is a 59 year old female who presents for Follow Up today.   Past Medical History:  Diagnosis Date  . Hypertension   . Stroke [facility]) 2007   Patient Active Problem List   Diagnosis Date Noted  . Neuropathy 08/21/2019  . Chronic foot pain, left 08/21/2019  . Seasonal allergies 08/21/2019  . Right upper quadrant pain 05/08/2019  . Gastroesophageal reflux disease with esophagitis 05/08/2019  . Hot flashes due to menopause 05/08/2019  . Carpal tunnel syndrome on both sides 11/19/2016  . Metabolic syndrome [id number]  . Essential hypertension 11/19/2016  . Allergic dermatitis 11/19/2016  . Allergy status to unspecified drugs, medicaments and biological substances status 11/19/2016  . BP check 09/22/2016  . Post-operative state 06/02/2016  . Obesity 01/08/2016  . Low back pain 10/06/2015  . Pain in joint, ankle and foot 10/06/2015   Current Status: Since her last office visit, she is doing well with no complaints. She continues to have chronic right wrist pain. She has lost a few pounds since her last office visit on 03/12/2020. She is choosing healthier meals and increasing her physical activity. She denies visual changes, chest pain, cough, shortness of breath, heart palpitations, and falls. She has occasional headaches and dizziness with position changes. Denies severe headaches, confusion, seizures, double vision, and blurred vision, nausea and vomiting. She denies fevers, chills, fatigue, recent infections, weight loss, and night sweats. Denies GI problems such as nausea, vomiting, diarrhea, and constipation. She has no reports of blood in stools,  dysuria and hematuria. No depression or anxiety, and denies suicidal ideations, homicidal ideations, or auditory hallucinations. She is taking all medications as prescribed.   Past Surgical History:  Procedure Laterality Date  . FOOT SURGERY  2015   left side had 2 surgeries in 2015  . TRIGGER FINGER RELEASE  2010    Family History  Problem Relation Age of Onset  . Hypertension Sister   . Colon cancer Neg Hx   . Stomach cancer Neg Hx     Social History   Socioeconomic History  . Marital status: Single    Spouse name: Not on file  . Number of children: Not on file  . Years of education: Not on file  . Highest education level: Not on file  Occupational History  . Not on file  Tobacco Use  . Smoking status: Former Research scientist (life sciences)  . Smokeless tobacco: Never Used  Substance and Sexual Activity  . Alcohol use: Yes    Comment: occ wine   . Drug use: No  . Sexual activity: Not Currently  Other Topics Concern  . Not on file  Social History Narrative  . Not on file   Social Determinants of Health   Financial Resource Strain:   . Difficulty of Paying Living Expenses:   Food Insecurity:   . Worried About Charity fundraiser in the Last Year:   . Arboriculturist in the Last Year:   Transportation Needs:   . Film/video editor (Medical):   Marland Kitchen Lack of Transportation (Non-Medical):   Physical Activity:   .  Days of Exercise per Week:   . Minutes of Exercise per Session:   Stress:   . Feeling of Stress :   Social Connections:   . Frequency of Communication with Friends and Family:   . Frequency of Social Gatherings with Friends and Family:   . Attends Religious Services:   . Active Member of Clubs or Organizations:   . Attends Archivist Meetings:   Marland Kitchen Marital Status:   Intimate Partner Violence:   . Fear of Current or Ex-Partner:   . Emotionally Abused:   Marland Kitchen Physically Abused:   . Sexually Abused:     Outpatient Medications Prior to Visit  Medication Sig  Dispense Refill  . acetaminophen (TYLENOL) 500 MG tablet Take 1 tablet (500 mg total) by mouth every 6 (six) hours as needed. 30 tablet 6  . amLODipine (NORVASC) 5 MG tablet Take 1 tablet (5 mg total) by mouth daily. 30 tablet 6  . cetirizine (ZYRTEC) 10 MG tablet Take 1 tablet (10 mg total) by mouth daily. 30 tablet 11  . cyclobenzaprine (FLEXERIL) 10 MG tablet Take 1 tablet (10 mg total) by mouth 3 (three) times daily as needed for muscle spasms. 30 tablet 6  . diphenhydrAMINE (BENADRYL) 2 % cream Apply topically 3 (three) times daily as needed for itching. 30 g 6  . gabapentin (NEURONTIN) 300 MG capsule Take 1 capsule (300 mg total) by mouth 2 (two) times daily. As needed. 60 capsule 6  . lisinopril-hydrochlorothiazide (ZESTORETIC) 20-12.5 MG tablet Take 1 tablet by mouth daily. 30 tablet 6  . Multiple Vitamin (MULTIVITAMIN WITH MINERALS) TABS tablet Take 1 tablet by mouth daily.    . Olopatadine HCl 0.2 % SOLN One drop into each eye daily, as needed. 2.5 mL 6  . diclofenac sodium (VOLTAREN) 1 % GEL Apply 2 g topically 4 (four) times daily. Rub into affected area of foot 2 to 4 times daily 100 g 3  . ibuprofen (ADVIL) 800 MG tablet Take 1 tablet (800 mg total) by mouth every 8 (eight) hours as needed. (Patient not taking: Reported on 03/12/2020) 30 tablet 3  . omeprazole (PRILOSEC) 40 MG capsule Take 1 capsule (40 mg total) by mouth daily. 30 capsule 6   Facility-Administered Medications Prior to Visit  Medication Dose Route Frequency Provider Last Rate Last Admin  . 0.9 %  sodium chloride infusion  500 mL Intravenous Once Doran Stabler, MD        Allergies  Allergen Reactions  . Hydrocortisone Hives  . Lyrica [Pregabalin] Rash    ROS Review of Systems  Constitutional: Negative.   HENT: Negative.   Eyes: Negative.   Respiratory: Negative.   Cardiovascular: Negative.   Gastrointestinal: Positive for abdominal distention.  Endocrine: Negative.   Genitourinary: Negative.    Musculoskeletal: Positive for arthralgias (generalized joint pain).  Skin: Negative.   Allergic/Immunologic: Negative.   Neurological: Positive for dizziness (occasional ) and headaches (occasional ).  Hematological: Negative.   Psychiatric/Behavioral: Negative.       Objective:    Physical Exam  Constitutional: She is oriented to person, place, and time. She appears well-developed and well-nourished.  HENT:  Head: Normocephalic and atraumatic.  Eyes: Conjunctivae are normal.  Cardiovascular: Normal rate, regular rhythm, normal heart sounds and intact distal pulses.  Pulmonary/Chest: Effort normal and breath sounds normal.  Abdominal: Soft. Bowel sounds are normal. She exhibits distension (obese).  Musculoskeletal:        General: Normal range of motion.  Cervical back: Normal range of motion and neck supple.  Neurological: She is alert and oriented to person, place, and time.  Skin: Skin is warm and dry.  Psychiatric: She has a normal mood and affect. Her behavior is normal. Judgment and thought content normal.  Nursing note and vitals reviewed.   BP 133/90   Pulse 78   Temp 98.3 F (36.8 C)   Ht 5\' 1"  (1.549 m)   Wt 221 lb 12.8 oz (100.6 kg)   SpO2 98%   BMI 41.91 kg/m  Wt Readings from Last 3 Encounters:  03/26/20 221 lb 12.8 oz (100.6 kg)  03/12/20 223 lb 3.2 oz (101.2 kg)  08/21/19 218 lb 12.8 oz (99.2 kg)     Health Maintenance Due  Topic Date Due  . COVID-19 Vaccine (1) Never done  . PAP SMEAR-Modifier  08/11/2019    There are no preventive care reminders to display for this patient.  Lab Results  Component Value Date   TSH 1.180 03/12/2020   Lab Results  Component Value Date   WBC 4.3 03/12/2020   HGB 11.8 03/12/2020   HCT 35.2 03/12/2020   MCV 96 03/12/2020   PLT 289 03/12/2020   Lab Results  Component Value Date   NA 138 03/12/2020   K 4.0 03/12/2020   CO2 22 03/12/2020   GLUCOSE 99 03/12/2020   BUN 12 03/12/2020   CREATININE 0.75  03/12/2020   BILITOT 0.3 03/12/2020   ALKPHOS 66 03/12/2020   AST 16 03/12/2020   ALT 16 03/12/2020   PROT 7.4 03/12/2020   ALBUMIN 4.4 03/12/2020   CALCIUM 9.7 03/12/2020   ANIONGAP 6 06/04/2015   Lab Results  Component Value Date   CHOL 192 03/12/2020   Lab Results  Component Value Date   HDL 70 03/12/2020   Lab Results  Component Value Date   LDLCALC 104 (H) 03/12/2020   Lab Results  Component Value Date   TRIG 103 03/12/2020   Lab Results  Component Value Date   CHOLHDL 2.7 03/12/2020   Lab Results  Component Value Date   HGBA1C 5.4 03/12/2020      Assessment & Plan:   1. Chronic pain of right wrist - diclofenac Sodium (VOLTAREN) 1 % GEL; Apply 4 g topically 4 (four) times daily.  Dispense: 4 g; Refill: 6  2. Right wrist pain  3. Class 3 severe obesity due to excess calories with serious comorbidity and body mass index (BMI) of 40.0 to 44.9 in adult [facility]) Recent moderate weight loss. Body mass index is 41.91 kg/m. Goal BMI  is <30. Encouraged efforts to reduce weight include engaging in physical activity as tolerated with goal of 150 minutes per week. Improve dietary choices and eat a meal regimen consistent with a Mediterranean or DASH diet. Reduce simple carbohydrates. Do not skip meals and eat healthy snacks throughout the day to avoid over-eating at dinner. Set a goal weight loss that is achievable for you.  4. Essential hypertension The current medical regimen is effective; blood pressure is stable at 133/90 today; continue present plan and medications as prescribed. She will continue to take medications as prescribed, to decrease high sodium intake, excessive alcohol intake, increase potassium intake, smoking cessation, and increase physical activity of at least 30 minutes of cardio activity daily. She will continue to follow Heart Healthy or DASH diet.  5. Hot flashes due to menopause Stable. Not worsening.   6. Follow up She will keep follow up  appointment as  scheduled.   Meds ordered this encounter  Medications  . diclofenac Sodium (VOLTAREN) 1 % GEL    Sig: Apply 4 g topically 4 (four) times daily.    Dispense:  4 g    Refill:  6    No orders of the defined types were placed in this encounter.   Referral Orders  No referral(s) requested today    Kathe Becton,  MSN, FNP-BC [facility] [address] [place], [place] [id number] [phone] fax    Problem List Items Addressed This Visit      Cardiovascular and Mediastinum   Essential hypertension     Other   Hot flashes due to menopause   Obesity    Other Visit Diagnoses    Chronic pain of right wrist    -  Primary   Relevant Medications   diclofenac Sodium (VOLTAREN) 1 % GEL   Right wrist pain       Follow up          Meds ordered this encounter  Medications  . diclofenac Sodium (VOLTAREN) 1 % GEL    Sig: Apply 4 g topically 4 (four) times daily.    Dispense:  4 g    Refill:  6    Follow-up: No follow-ups on file.    Azzie Glatter, FNP

## 2020-03-27 DIAGNOSIS — M25531 Pain in right wrist: Secondary | ICD-10-CM | POA: Insufficient documentation

## 2020-04-04 MED FILL — OMEPRAZOLE DR 40 MG CAPSULE: 40 | 30 days supply | Qty: 30 | Fill #1

## 2020-04-30 ENCOUNTER — Ambulatory Visit: Payer: Medicare HMO | Admitting: Gastroenterology

## 2020-04-30 MED FILL — LISINOPRIL-HCTZ 20-12.5 MG: 20-12.5 | 30 days supply | Qty: 30 | Fill #0

## 2020-04-30 MED FILL — OLOPATADINE HCL 0.2 % SOLN: 0.2 | 18 days supply | Qty: 3 | Fill #1

## 2020-04-30 MED FILL — OMEPRAZOLE DR 40 MG CAPSULE: 40 | 30 days supply | Qty: 30 | Fill #2

## 2020-04-30 MED FILL — VYZULTA 0.024 % SOLN: 0.024 | 18 days supply | Qty: 3 | Fill #1

## 2020-06-02 MED FILL — OMEPRAZOLE DR 40 MG CAPSULE: 40 | 30 days supply | Qty: 30 | Fill #3

## 2020-06-02 MED FILL — LISINOPRIL-HCTZ 20-12.5 MG: 20-12.5 | 90 days supply | Qty: 90 | Fill #1

## 2020-06-02 MED FILL — IBUPROFEN 800 MG TABLET: 800 | 10 days supply | Qty: 30 | Fill #2

## 2020-06-13 ENCOUNTER — Ambulatory Visit: Payer: Medicare HMO | Admitting: Family Medicine

## 2020-06-18 DIAGNOSIS — H401131 Primary open-angle glaucoma, bilateral, mild stage: Secondary | ICD-10-CM | POA: Diagnosis not present

## 2020-07-04 ENCOUNTER — Other Ambulatory Visit: Payer: Self-pay

## 2020-07-04 ENCOUNTER — Encounter: Payer: Self-pay | Admitting: Family Medicine

## 2020-07-04 ENCOUNTER — Ambulatory Visit (INDEPENDENT_AMBULATORY_CARE_PROVIDER_SITE_OTHER): Payer: Medicare HMO | Admitting: Family Medicine

## 2020-07-04 VITALS — BP 133/90 | HR 80 | Temp 97.7°F | Resp 17 | Ht 61.0 in | Wt 216.4 lb

## 2020-07-04 DIAGNOSIS — G5603 Carpal tunnel syndrome, bilateral upper limbs: Secondary | ICD-10-CM

## 2020-07-04 DIAGNOSIS — I10 Essential (primary) hypertension: Secondary | ICD-10-CM

## 2020-07-04 DIAGNOSIS — M25531 Pain in right wrist: Secondary | ICD-10-CM

## 2020-07-04 DIAGNOSIS — Z6841 Body Mass Index (BMI) 40.0 and over, adult: Secondary | ICD-10-CM

## 2020-07-04 DIAGNOSIS — M5442 Lumbago with sciatica, left side: Secondary | ICD-10-CM | POA: Diagnosis not present

## 2020-07-04 DIAGNOSIS — M79672 Pain in left foot: Secondary | ICD-10-CM

## 2020-07-04 DIAGNOSIS — Z09 Encounter for follow-up examination after completed treatment for conditions other than malignant neoplasm: Secondary | ICD-10-CM

## 2020-07-04 DIAGNOSIS — R634 Abnormal weight loss: Secondary | ICD-10-CM

## 2020-07-04 DIAGNOSIS — M5441 Lumbago with sciatica, right side: Secondary | ICD-10-CM

## 2020-07-04 DIAGNOSIS — N951 Menopausal and female climacteric states: Secondary | ICD-10-CM | POA: Diagnosis not present

## 2020-07-04 DIAGNOSIS — E66813 Obesity, class 3: Secondary | ICD-10-CM

## 2020-07-04 DIAGNOSIS — G8929 Other chronic pain: Secondary | ICD-10-CM

## 2020-07-04 MED ORDER — IBUPROFEN 800 MG PO TABS: 800.0000 mg | ORAL_TABLET | Freq: Three times a day (TID) | ORAL | 6 refills | Status: DC | PRN

## 2020-07-04 MED FILL — IBUPROFEN 800 MG TABLET: 800 | 10 days supply | Qty: 30 | Fill #0

## 2020-07-04 NOTE — Progress Notes (Signed)
Patient [facility]  Established Patient Office Visit  Subjective:  Patient ID: Deanna Floyd, female    DOB: February 28, 1961  Age: 59 y.o. MRN: [id number]  CC:  Chief Complaint  Patient presents with  . Follow-up    Pt states a big lump on her R wrisit the one she had surgery 7-8 yrs ago.Pt stated the lump went away 4-5 days later. Pt states that is the hand she use all the time. Pt states now she is pain.    HPI Deanna Floyd is a 59 year old female who presents for Follow Up today.  Past Medical History:  Diagnosis Date  . Hypertension   . Stroke [facility]) 2007    Past Surgical History:  Procedure Laterality Date  . FOOT SURGERY  2015   left side had 2 surgeries in 2015  . TRIGGER FINGER RELEASE  2010    Family History  Problem Relation Age of Onset  . Hypertension Sister   . Colon cancer Neg Hx   . Stomach cancer Neg Hx    Current Status: Since her last office visit, she has c/o chronic right wrist pain, which has increased within the last few weeks now. She reports that she has been using extremity more recently. She is currently taking Motrin, Diclofenac Gel and CBD cream for pain relief. She denies fevers, chills, fatigue, recent infections, weight loss, and night sweats. She has not had any headaches, visual changes, dizziness, and falls. No chest pain, heart palpitations, cough and shortness of breath reported. Denies GI problems such as nausea, vomiting, diarrhea, and constipation. She has no reports of blood in stools, dysuria and hematuria. No depression or anxiety reported today.   Social History   Socioeconomic History  . Marital status: Single    Spouse name: Not on file  . Number of children: Not on file  . Years of education: Not on file  . Highest education level: Not on file  Occupational History  . Not on file  Tobacco Use  . Smoking status: Former Research scientist (life sciences)  . Smokeless tobacco: Never Used  Vaping Use  .  Vaping Use: Never used  Substance and Sexual Activity  . Alcohol use: Yes    Comment: occ wine   . Drug use: No  . Sexual activity: Not Currently  Other Topics Concern  . Not on file  Social History Narrative  . Not on file   Social Determinants of Health   Financial Resource Strain:   . Difficulty of Paying Living Expenses: Not on file  Food Insecurity:   . Worried About Charity fundraiser in the Last Year: Not on file  . Ran Out of Food in the Last Year: Not on file  Transportation Needs:   . Lack of Transportation (Medical): Not on file  . Lack of Transportation (Non-Medical): Not on file  Physical Activity:   . Days of Exercise per Week: Not on file  . Minutes of Exercise per Session: Not on file  Stress:   . Feeling of Stress : Not on file  Social Connections:   . Frequency of Communication with Friends and Family: Not on file  . Frequency of Social Gatherings with Friends and Family: Not on file  . Attends Religious Services: Not on file  . Active Member of Clubs or Organizations: Not on file  . Attends Archivist Meetings: Not on file  . Marital Status: Not on file  Intimate Partner  Violence:   . Fear of Current or Ex-Partner: Not on file  . Emotionally Abused: Not on file  . Physically Abused: Not on file  . Sexually Abused: Not on file    Outpatient Medications Prior to Visit  Medication Sig Dispense Refill  . amLODipine (NORVASC) 5 MG tablet Take 1 tablet (5 mg total) by mouth daily. 30 tablet 6  . cyclobenzaprine (FLEXERIL) 10 MG tablet Take 1 tablet (10 mg total) by mouth 3 (three) times daily as needed for muscle spasms. 30 tablet 6  . diclofenac Sodium (VOLTAREN) 1 % GEL Apply 4 g topically 4 (four) times daily. 4 g 6  . diphenhydrAMINE (BENADRYL) 2 % cream Apply topically 3 (three) times daily as needed for itching. 30 g 6  . lisinopril-hydrochlorothiazide (ZESTORETIC) 20-12.5 MG tablet Take 1 tablet by mouth daily. 30 tablet 6  . Multiple  Vitamin (MULTIVITAMIN WITH MINERALS) TABS tablet Take 1 tablet by mouth daily.    . Olopatadine HCl 0.2 % SOLN One drop into each eye daily, as needed. 2.5 mL 6  . omeprazole (PRILOSEC) 40 MG capsule Take 1 capsule (40 mg total) by mouth daily. 30 capsule 6  . ibuprofen (ADVIL) 800 MG tablet Take 1 tablet (800 mg total) by mouth every 8 (eight) hours as needed. 30 tablet 3  . acetaminophen (TYLENOL) 500 MG tablet Take 1 tablet (500 mg total) by mouth every 6 (six) hours as needed. (Patient not taking: Reported on 07/04/2020) 30 tablet 6  . cetirizine (ZYRTEC) 10 MG tablet Take 1 tablet (10 mg total) by mouth daily. (Patient not taking: Reported on 07/04/2020) 30 tablet 11  . gabapentin (NEURONTIN) 300 MG capsule Take 1 capsule (300 mg total) by mouth 2 (two) times daily. As needed. (Patient not taking: Reported on 07/04/2020) 60 capsule 6   Facility-Administered Medications Prior to Visit  Medication Dose Route Frequency Provider Last Rate Last Admin  . 0.9 %  sodium chloride infusion  500 mL Intravenous Once Doran Stabler, MD        Allergies  Allergen Reactions  . Hydrocortisone Hives  . Lyrica [Pregabalin] Rash    ROS Review of Systems  Constitutional: Negative.   HENT: Negative.   Eyes: Negative.   Respiratory: Negative.   Cardiovascular: Negative.   Gastrointestinal: Positive for abdominal distention.  Endocrine: Negative.   Genitourinary: Negative.   Musculoskeletal: Positive for arthralgias (chronic right wrist pain) and back pain (chronic).  Skin: Negative.   Allergic/Immunologic: Negative.   Neurological: Positive for dizziness (occasional ) and headaches (occasional ).  Hematological: Negative.   Psychiatric/Behavioral: Negative.       Objective:    Physical Exam Vitals and nursing note reviewed.  Constitutional:      Appearance: Normal appearance.  HENT:     Head: Normocephalic and atraumatic.  Cardiovascular:     Rate and Rhythm: Normal rate and regular  rhythm.     Pulses: Normal pulses.     Heart sounds: Normal heart sounds.  Pulmonary:     Effort: Pulmonary effort is normal.     Breath sounds: Normal breath sounds.  Abdominal:     General: Bowel sounds are normal. There is distension.     Palpations: Abdomen is soft.  Musculoskeletal:     Cervical back: Normal range of motion and neck supple.     Comments: Limited ROM in spine and right wrist  Skin:    General: Skin is warm and dry.  Neurological:  General: No focal deficit present.     Mental Status: She is alert and oriented to person, place, and time.     Motor: Weakness (right wrist) present.  Psychiatric:        Mood and Affect: Mood normal.        Behavior: Behavior normal.     BP 133/90 (BP Location: Left Arm, Patient Position: Sitting, Cuff Size: Normal)   Pulse 80   Temp 97.7 F (36.5 C)   Resp 17   Ht 5\' 1"  (1.549 m)   Wt 216 lb 6.4 oz (98.2 kg)   SpO2 100%   BMI 40.89 kg/m  Wt Readings from Last 3 Encounters:  07/04/20 216 lb 6.4 oz (98.2 kg)  03/26/20 221 lb 12.8 oz (100.6 kg)  03/12/20 223 lb 3.2 oz (101.2 kg)     Health Maintenance Due  Topic Date Due  . COVID-19 Vaccine (1) Never done  . PAP SMEAR-Modifier  08/11/2019  . INFLUENZA VACCINE  06/08/2020    There are no preventive care reminders to display for this patient.  Lab Results  Component Value Date   TSH 1.180 03/12/2020   Lab Results  Component Value Date   WBC 4.3 03/12/2020   HGB 11.8 03/12/2020   HCT 35.2 03/12/2020   MCV 96 03/12/2020   PLT 289 03/12/2020   Lab Results  Component Value Date   NA 138 03/12/2020   K 4.0 03/12/2020   CO2 22 03/12/2020   GLUCOSE 99 03/12/2020   BUN 12 03/12/2020   CREATININE 0.75 03/12/2020   BILITOT 0.3 03/12/2020   ALKPHOS 66 03/12/2020   AST 16 03/12/2020   ALT 16 03/12/2020   PROT 7.4 03/12/2020   ALBUMIN 4.4 03/12/2020   CALCIUM 9.7 03/12/2020   ANIONGAP 6 06/04/2015   Lab Results  Component Value Date   CHOL 192  03/12/2020   Lab Results  Component Value Date   HDL 70 03/12/2020   Lab Results  Component Value Date   LDLCALC 104 (H) 03/12/2020   Lab Results  Component Value Date   TRIG 103 03/12/2020   Lab Results  Component Value Date   CHOLHDL 2.7 03/12/2020   Lab Results  Component Value Date   HGBA1C 5.4 03/12/2020      Assessment & Plan:   1. Carpal tunnel syndrome She will continue supportive pain medications. She will wear wrist brace for extra support, and continue RICE techniques as needed. Patient not interested in surgery at this time. Possible future referral to Orthopedics as needed.  2. Chronic pain of right wrist Pain > in right wrist today. Monitor.   3. Chronic bilateral low back pain with bilateral sciatica - ibuprofen (ADVIL) 800 MG tablet; Take 1 tablet (800 mg total) by mouth every 8 (eight) hours as needed.  Dispense: 30 tablet; Refill: 6  4. Chronic foot pain, left - ibuprofen (ADVIL) 800 MG tablet; Take 1 tablet (800 mg total) by mouth every 8 (eight) hours as needed.  Dispense: 30 tablet; Refill: 6  5. Essential hypertension The current medical regimen is effective; blood pressure is stable at 133/90 today; continue present plan and medications as prescribed. She will continue to take medications as prescribed, to decrease high sodium intake, excessive alcohol intake, increase potassium intake, smoking cessation, and increase physical activity of at least 30 minutes of cardio activity daily. She will continue to follow Heart Healthy or DASH diet.  6. Hot flashes due to menopause Stable.  7. Class 3  severe obesity due to excess calories with serious comorbidity and body mass index (BMI) of 40.0 to 44.9 in adult [facility]) Body mass index is 40.89 kg/m. Goal BMI  is <30. Encouraged efforts to reduce weight include engaging in physical activity as tolerated with goal of 150 minutes per week. Improve dietary choices and eat a meal regimen consistent with a  Mediterranean or DASH diet. Reduce simple carbohydrates. Do not skip meals and eat healthy snacks throughout the day to avoid over-eating at dinner. Set a goal weight loss that is achievable for you.  8. Weight loss She has had an 8 lb weight loss in 3 months.   9. Follow up She will follow up in 6 months.   Meds ordered this encounter  Medications  . ibuprofen (ADVIL) 800 MG tablet    Sig: Take 1 tablet (800 mg total) by mouth every 8 (eight) hours as needed.    Dispense:  30 tablet    Refill:  6    No orders of the defined types were placed in this encounter.   Referral Orders  No referral(s) requested today    Kathe Becton,  MSN, FNP-BC [facility] [address] [place], [place] [address] [phone]- fax   Problem List Items Addressed This Visit      Cardiovascular and Mediastinum   Essential hypertension     Other   Chronic foot pain, left   Relevant Medications   ibuprofen (ADVIL) 800 MG tablet   Hot flashes due to menopause   Low back pain   Relevant Medications   ibuprofen (ADVIL) 800 MG tablet   Obesity    Other Visit Diagnoses    Chronic pain of right wrist    -  Primary   Relevant Medications   ibuprofen (ADVIL) 800 MG tablet   Weight loss       Follow up          Meds ordered this encounter  Medications  . ibuprofen (ADVIL) 800 MG tablet    Sig: Take 1 tablet (800 mg total) by mouth every 8 (eight) hours as needed.    Dispense:  30 tablet    Refill:  6    Follow-up: No follow-ups on file.    Azzie Glatter, FNP

## 2020-07-06 ENCOUNTER — Encounter: Payer: Self-pay | Admitting: Family Medicine

## 2020-07-07 MED FILL — OMEPRAZOLE DR 40 MG CAPSULE: 40 | 30 days supply | Qty: 30 | Fill #0

## 2020-07-10 ENCOUNTER — Telehealth: Payer: Self-pay | Admitting: Family Medicine

## 2020-07-10 ENCOUNTER — Ambulatory Visit (INDEPENDENT_AMBULATORY_CARE_PROVIDER_SITE_OTHER): Payer: Medicare HMO | Admitting: Nurse Practitioner

## 2020-07-10 ENCOUNTER — Encounter: Payer: Self-pay | Admitting: Nurse Practitioner

## 2020-07-10 VITALS — BP 110/80 | HR 81 | Ht 61.0 in | Wt 216.0 lb

## 2020-07-10 DIAGNOSIS — K219 Gastro-esophageal reflux disease without esophagitis: Secondary | ICD-10-CM

## 2020-07-10 NOTE — Progress Notes (Signed)
      IMPRESSION and PLAN:    N/A  Patient is here for an appointment but is unsure why.  She ran out of her acid blockers many months ago, thinks the appointment was made at that time.  She has been able to get refills on her GERD medicine.  She has no GI complaints.  Says GERD symptoms are under control.  Bowel movements moving well.  We decided to just cancel today's visit. Her only complaint is that of wrist pain which PCP has been addressing.    Physical Exam:     Ht 5\' 1"  (1.549 m)   Wt 216 lb (98 kg)   BMI 40.81 kg/m   GENERAL:  Pleasant female in NAD PSYCH: : Cooperative, normal affect NEURO: Alert and oriented x 3, no focal neurologic deficits   Tye Savoy , NP 07/10/2020,

## 2020-07-10 NOTE — Telephone Encounter (Signed)
Error

## 2020-07-10 NOTE — Telephone Encounter (Signed)
error 

## 2020-07-15 ENCOUNTER — Other Ambulatory Visit: Payer: Self-pay

## 2020-07-15 ENCOUNTER — Emergency Department (HOSPITAL_COMMUNITY)
Admission: EM | Admit: 2020-07-15 | Discharge: 2020-07-15 | Disposition: A | Discharge status: Home / Self Care | Payer: Medicare HMO | Attending: Emergency Medicine | Admitting: Emergency Medicine

## 2020-07-15 ENCOUNTER — Encounter (HOSPITAL_COMMUNITY): Payer: Self-pay | Admitting: *Deleted

## 2020-07-15 DIAGNOSIS — M549 Dorsalgia, unspecified: Secondary | ICD-10-CM | POA: Diagnosis not present

## 2020-07-15 DIAGNOSIS — M542 Cervicalgia: Secondary | ICD-10-CM | POA: Insufficient documentation

## 2020-07-15 DIAGNOSIS — Z5321 Procedure and treatment not carried out due to patient leaving prior to being seen by health care provider: Secondary | ICD-10-CM | POA: Insufficient documentation

## 2020-07-15 DIAGNOSIS — R110 Nausea: Secondary | ICD-10-CM | POA: Diagnosis not present

## 2020-07-15 DIAGNOSIS — R42 Dizziness and giddiness: Secondary | ICD-10-CM | POA: Diagnosis not present

## 2020-07-15 NOTE — ED Triage Notes (Signed)
Per EMS-coming from home-has been feeling dizzy since waking up this am-worse when she stands and lays down-also complaining of a knot on the back of neck

## 2020-07-15 NOTE — ED Notes (Signed)
Patient has a raised area on the back of her neck on the left side.  No trauma.  No fevers.  Area is tender to touch.  Patient states she had onset of dizziness and pain related to this area today

## 2020-07-15 NOTE — ED Notes (Signed)
Pt gave her patient labels to the screeners and said that she was not going to stay.

## 2020-07-28 ENCOUNTER — Ambulatory Visit: Payer: Medicare HMO | Admitting: Family Medicine

## 2020-08-05 MED FILL — OMEPRAZOLE DR 40 MG CAPSULE: 40 | 30 days supply | Qty: 30 | Fill #1

## 2020-08-28 MED FILL — LISINOPRIL-HCTZ 20-12.5 MG: 20-12.5 | 90 days supply | Qty: 90 | Fill #2

## 2020-09-01 MED FILL — OMEPRAZOLE DR 40 MG CAPSULE: 40 | 30 days supply | Qty: 30 | Fill #2

## 2020-09-01 MED FILL — VYZULTA 0.024 % SOLN: 0.024 | 18 days supply | Qty: 3 | Fill #2

## 2020-09-26 MED FILL — VYZULTA 0.024 % SOLN: 0.024 | 18 days supply | Qty: 3 | Fill #3

## 2020-09-26 MED FILL — OMEPRAZOLE DR 40 MG CAPSULE: 40 | 30 days supply | Qty: 30 | Fill #3

## 2020-10-07 ENCOUNTER — Other Ambulatory Visit: Payer: Self-pay | Admitting: Family Medicine

## 2020-10-07 DIAGNOSIS — Z1231 Encounter for screening mammogram for malignant neoplasm of breast: Secondary | ICD-10-CM

## 2020-10-23 MED FILL — AMLODIPINE BESYLATE 5 MG TA: 5 | 30 days supply | Qty: 30 | Fill #1

## 2020-10-23 MED FILL — OLOPATADINE HCL 0.2 % SOLN: 0.2 | 25 days supply | Qty: 3 | Fill #2

## 2020-10-23 MED FILL — OMEPRAZOLE DR 40 MG CAPSULE: 40 | 30 days supply | Qty: 30 | Fill #4

## 2020-10-23 MED FILL — VYZULTA 0.024 % SOLN: 0.024 | 18 days supply | Qty: 3 | Fill #4

## 2020-10-25 IMAGING — MG DIGITAL SCREENING BILATERAL MAMMOGRAM WITH TOMO AND CAD
8 series · 8 of 24 positions shown · non-contrast
Comparison: Previous exam(s).

CLINICAL DATA: Screening.

EXAM:
DIGITAL SCREENING BILATERAL MAMMOGRAM WITH TOMO AND CAD

[L CC synth-2D]
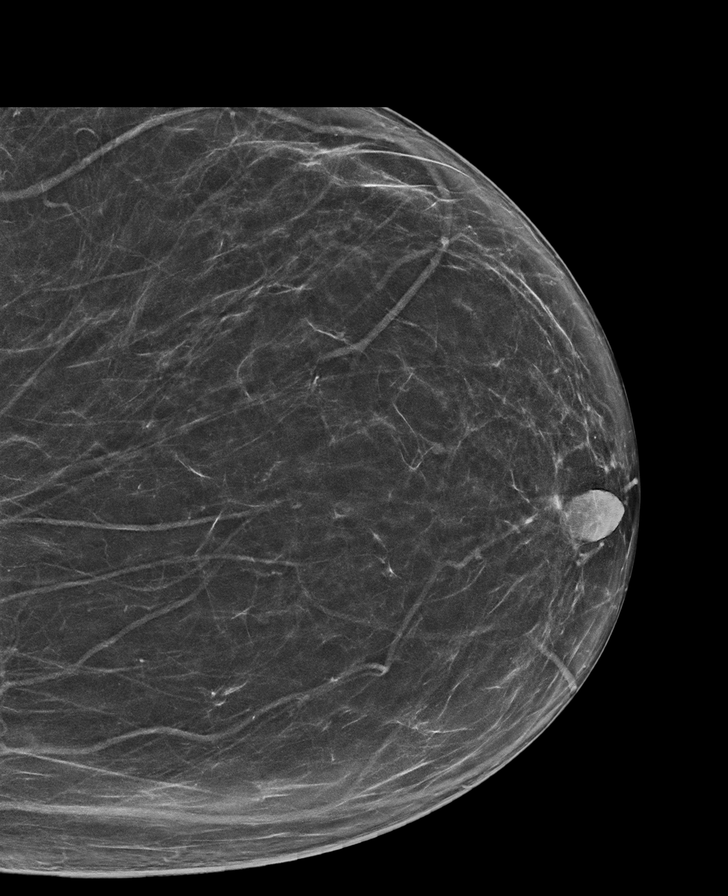

[R CC synth-2D]
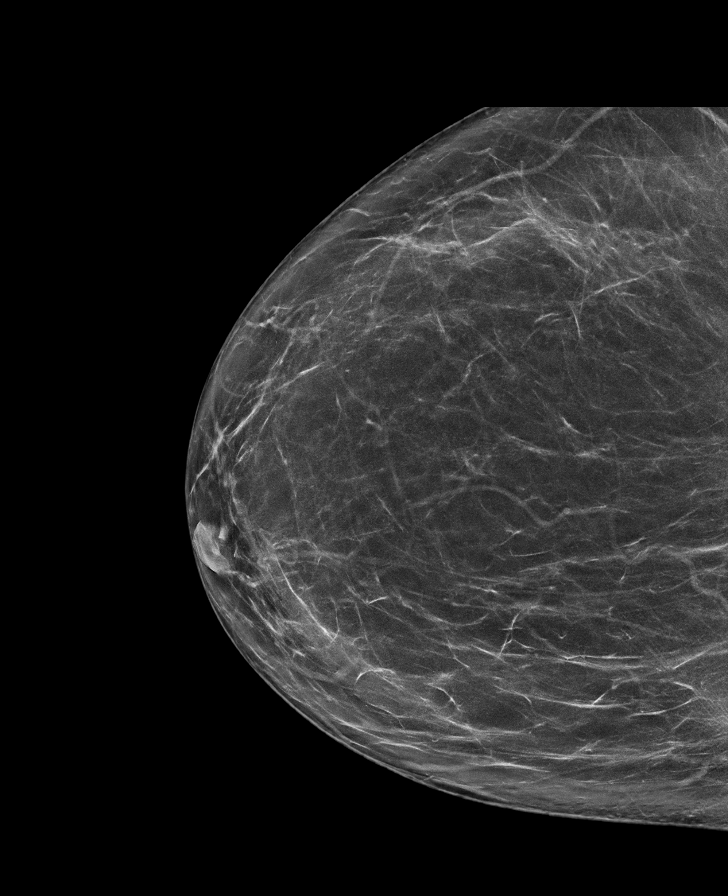

[R MLO synth-2D]
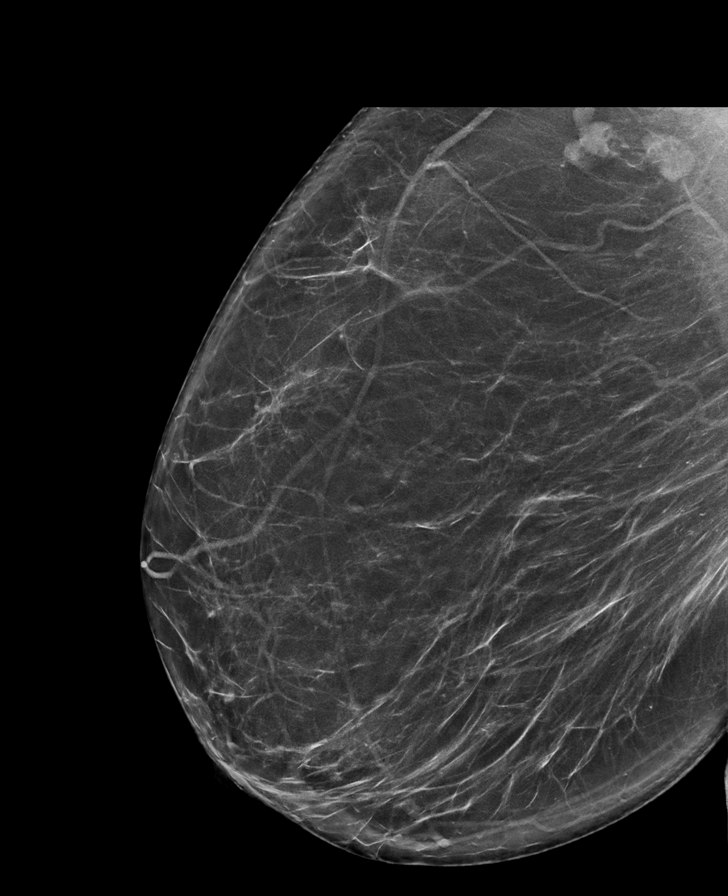

[L MLO synth-2D]
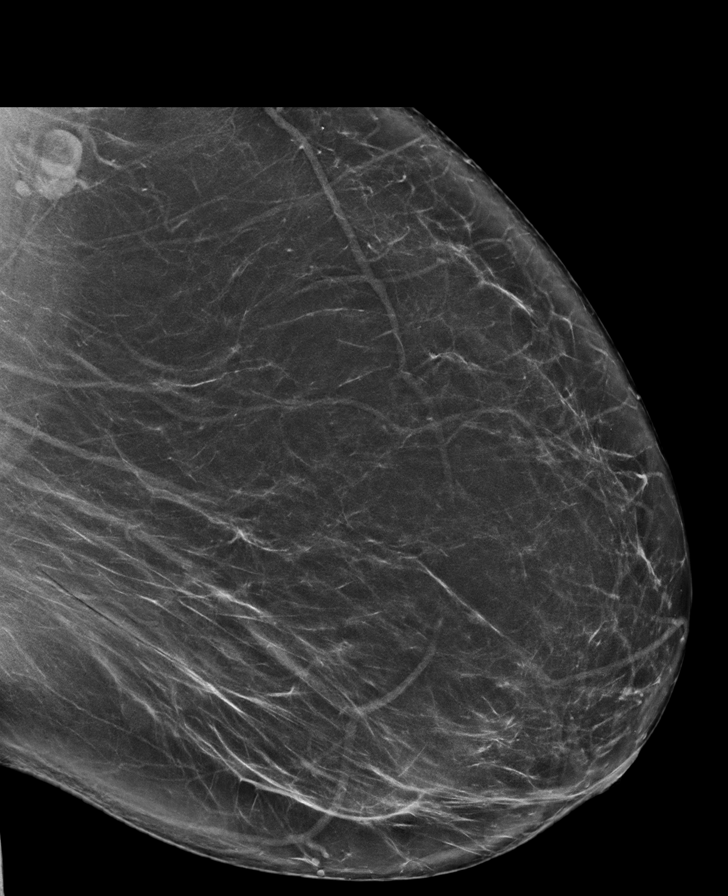

[L MLO tomo · tomo slice 46/91.0]
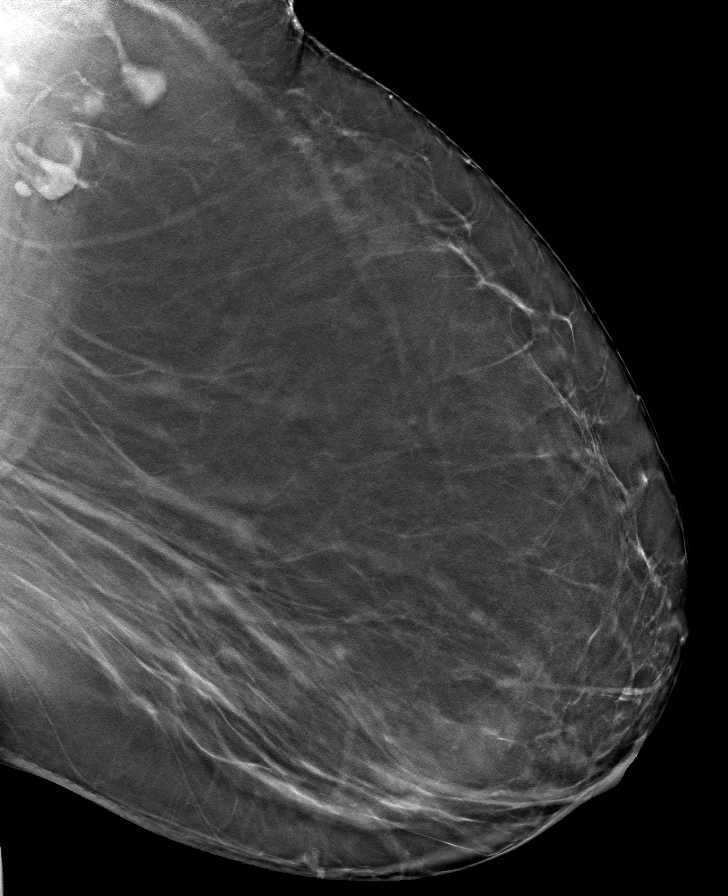

[R CC tomo · tomo slice 43/86.0]
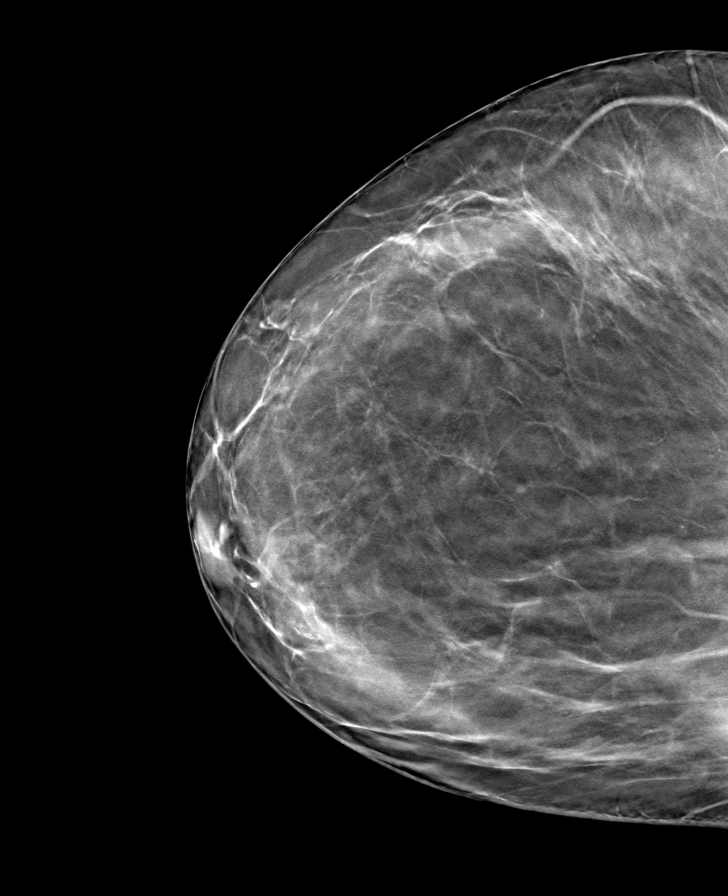

[R MLO tomo · tomo slice 45/88.0]
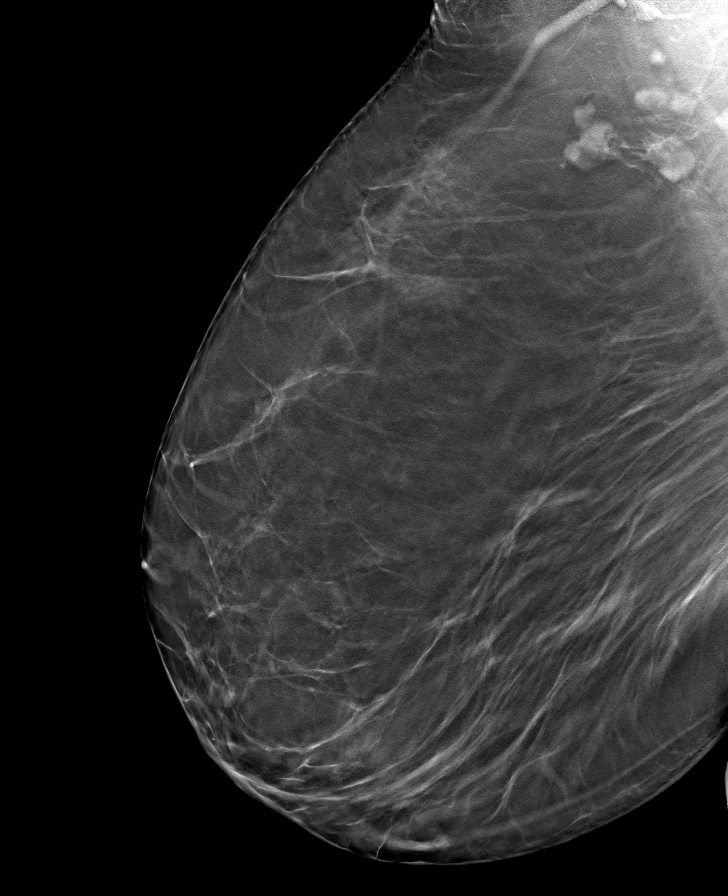

[L CC tomo · tomo slice 41/81.0]
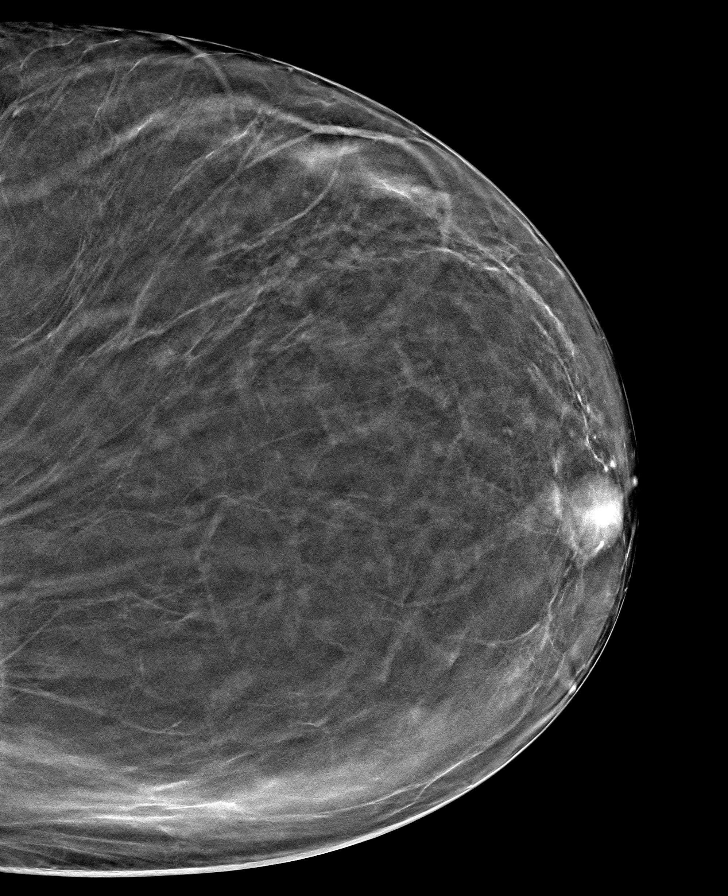

[8 of 24 positions shown; findings below may reference images not displayed]

ACR Breast Density Category b: There are scattered areas of
fibroglandular density.
FINDINGS: There are no findings suspicious for malignancy. Images were
processed with CAD.
IMPRESSION: No mammographic evidence of malignancy. A result letter of this
screening mammogram will be mailed directly to the patient.

RECOMMENDATION:
Screening mammogram in one year. (Code:[id number])

BI-RADS CATEGORY  1: Negative.

## 2020-11-19 ENCOUNTER — Ambulatory Visit: Payer: Medicare HMO

## 2020-11-25 ENCOUNTER — Other Ambulatory Visit: Payer: Self-pay | Admitting: Family Medicine

## 2020-11-25 DIAGNOSIS — I10 Essential (primary) hypertension: Secondary | ICD-10-CM

## 2020-11-25 MED FILL — OMEPRAZOLE DR 40 MG CAPSULE: 40 | 30 days supply | Qty: 30 | Fill #5

## 2020-11-25 MED FILL — LISINOPRIL-HCTZ 20-12.5 MG: 20-12.5 | 90 days supply | Qty: 90 | Fill #0

## 2020-11-25 MED FILL — AMLODIPINE BESYLATE 5 MG TA: 5 | 90 days supply | Qty: 90 | Fill #0

## 2020-12-11 DIAGNOSIS — H401131 Primary open-angle glaucoma, bilateral, mild stage: Secondary | ICD-10-CM | POA: Diagnosis not present

## 2020-12-11 DIAGNOSIS — H40033 Anatomical narrow angle, bilateral: Secondary | ICD-10-CM | POA: Diagnosis not present

## 2020-12-31 ENCOUNTER — Other Ambulatory Visit: Payer: Self-pay

## 2020-12-31 ENCOUNTER — Ambulatory Visit
Admission: RE | Admit: 2020-12-31 | Discharge: 2020-12-31 | Disposition: A | Discharge status: Home / Self Care | Payer: Medicare Other | Source: Ambulatory Visit | Attending: Family Medicine | Admitting: Family Medicine

## 2020-12-31 DIAGNOSIS — Z1231 Encounter for screening mammogram for malignant neoplasm of breast: Secondary | ICD-10-CM | POA: Diagnosis not present

## 2021-01-02 MED FILL — VYZULTA 0.024 % SOLN: 0.024 | 18 days supply | Qty: 3 | Fill #5

## 2021-01-02 MED FILL — OMEPRAZOLE DR 40 MG CAPSULE: 40 | 30 days supply | Qty: 30 | Fill #6

## 2021-01-05 ENCOUNTER — Ambulatory Visit (INDEPENDENT_AMBULATORY_CARE_PROVIDER_SITE_OTHER): Payer: Medicare Other | Admitting: Family Medicine

## 2021-01-05 ENCOUNTER — Other Ambulatory Visit: Payer: Self-pay

## 2021-01-05 ENCOUNTER — Encounter: Payer: Self-pay | Admitting: Family Medicine

## 2021-01-05 VITALS — BP 139/83 | HR 85 | Temp 97.3°F | Ht 61.0 in | Wt 220.0 lb

## 2021-01-05 DIAGNOSIS — I10 Essential (primary) hypertension: Secondary | ICD-10-CM | POA: Diagnosis not present

## 2021-01-05 DIAGNOSIS — M5442 Lumbago with sciatica, left side: Secondary | ICD-10-CM | POA: Diagnosis not present

## 2021-01-05 DIAGNOSIS — N951 Menopausal and female climacteric states: Secondary | ICD-10-CM

## 2021-01-05 DIAGNOSIS — G8929 Other chronic pain: Secondary | ICD-10-CM | POA: Diagnosis not present

## 2021-01-05 DIAGNOSIS — M79672 Pain in left foot: Secondary | ICD-10-CM

## 2021-01-05 DIAGNOSIS — Z6841 Body Mass Index (BMI) 40.0 and over, adult: Secondary | ICD-10-CM

## 2021-01-05 DIAGNOSIS — G5603 Carpal tunnel syndrome, bilateral upper limbs: Secondary | ICD-10-CM | POA: Diagnosis not present

## 2021-01-05 DIAGNOSIS — Z09 Encounter for follow-up examination after completed treatment for conditions other than malignant neoplasm: Secondary | ICD-10-CM

## 2021-01-05 DIAGNOSIS — M5441 Lumbago with sciatica, right side: Secondary | ICD-10-CM | POA: Diagnosis not present

## 2021-01-05 DIAGNOSIS — M25531 Pain in right wrist: Secondary | ICD-10-CM

## 2021-01-05 DIAGNOSIS — E66813 Obesity, class 3: Secondary | ICD-10-CM

## 2021-01-05 NOTE — Progress Notes (Signed)
Patient [facility]   Established Patient Office Visit  Subjective:  Patient ID: Deanna Floyd, female    DOB: [birth year]  Age: 60 y.o. MRN: [id number]  CC:  Chief Complaint  Patient presents with  . Follow-up    6 month follow up , htn , having pain in arms and hands    HPI Deanna Floyd is a 60 year old female who presents for Follow Up today.    Patient Active Problem List   Diagnosis Date Noted  . Right wrist pain 03/27/2020  . Neuropathy 08/21/2019  . Chronic foot pain, left 08/21/2019  . Seasonal allergies 08/21/2019  . Right upper quadrant pain 05/08/2019  . Gastroesophageal reflux disease with esophagitis 05/08/2019  . Hot flashes due to menopause 05/08/2019  . Carpal tunnel syndrome on both sides 11/19/2016  . Metabolic syndrome 19/62/2297  . Essential hypertension 11/19/2016  . Allergic dermatitis 11/19/2016  . Allergy status to unspecified drugs, medicaments and biological substances status 11/19/2016  . BP check 09/22/2016  . Post-operative state 06/02/2016  . Obesity 01/08/2016  . Low back pain 10/06/2015  . Pain in joint, ankle and foot 10/06/2015   Current Status: Since her last office visit, she has c/o chronic bilateral wrist pain r/t history of Carpal Tunnel Syndrome, which she uses wrist braces, gel, and CBD cream. She states that CBD creme is effective. She continues to work on her weight loss goal. She denies fevers, chills, fatigue, recent infections, weight loss, and night sweats. She has not had any headaches, visual changes, dizziness, and falls. No chest pain, heart palpitations, cough and shortness of breath reported. Denies GI problems such as nausea, vomiting, diarrhea, and constipation. She has no reports of blood in stools, dysuria and hematuria. No depression or anxiety reported today. She is taking all medications as prescribed.   Past Medical History:  Diagnosis Date  . GERD (gastroesophageal  reflux disease)   . Hypertension   . Stroke [facility]) 2007    Past Surgical History:  Procedure Laterality Date  . FOOT SURGERY  2015   left side had 2 surgeries in 2015  . TRIGGER FINGER RELEASE  2010    Family History  Problem Relation Age of Onset  . Hypertension Sister   . Colon cancer Neg Hx   . Stomach cancer Neg Hx     Social History   Socioeconomic History  . Marital status: Single    Spouse name: Not on file  . Number of children: Not on file  . Years of education: Not on file  . Highest education level: Not on file  Occupational History  . Not on file  Tobacco Use  . Smoking status: Former Research scientist (life sciences)  . Smokeless tobacco: Never Used  Vaping Use  . Vaping Use: Never used  Substance and Sexual Activity  . Alcohol use: Yes    Comment: occ wine   . Drug use: No  . Sexual activity: Not Currently  Other Topics Concern  . Not on file  Social History Narrative  . Not on file   Social Determinants of Health   Financial Resource Strain: Not on file  Food Insecurity: Not on file  Transportation Needs: Not on file  Physical Activity: Not on file  Stress: Not on file  Social Connections: Not on file  Intimate Partner Violence: Not on file    Outpatient Medications Prior to Visit  Medication Sig Dispense Refill  . acetaminophen (TYLENOL) 500 MG tablet Take  1 tablet (500 mg total) by mouth every 6 (six) hours as needed. 30 tablet 6  . amLODipine (NORVASC) 5 MG tablet Take 1 tablet (5 mg total) by mouth daily. 30 tablet 6  . cetirizine (ZYRTEC) 10 MG tablet Take 1 tablet (10 mg total) by mouth daily. 30 tablet 11  . cyclobenzaprine (FLEXERIL) 10 MG tablet Take 1 tablet (10 mg total) by mouth 3 (three) times daily as needed for muscle spasms. 30 tablet 6  . diclofenac Sodium (VOLTAREN) 1 % GEL Apply 4 g topically 4 (four) times daily. 4 g 6  . diphenhydrAMINE (BENADRYL) 2 % cream Apply topically 3 (three) times daily as needed for itching. 30 g 6  . gabapentin  (NEURONTIN) 300 MG capsule Take 1 capsule (300 mg total) by mouth 2 (two) times daily. As needed. 60 capsule 6  . ibuprofen (ADVIL) 800 MG tablet Take 1 tablet (800 mg total) by mouth every 8 (eight) hours as needed. 30 tablet 6  . lisinopril-hydrochlorothiazide (ZESTORETIC) 20-12.5 MG tablet TAKE 1 TABLET BY MOUTH DAILY. 90 tablet 3  . Multiple Vitamin (MULTIVITAMIN WITH MINERALS) TABS tablet Take 1 tablet by mouth daily.    . Olopatadine HCl 0.2 % SOLN One drop into each eye daily, as needed. 2.5 mL 6  . omeprazole (PRILOSEC) 40 MG capsule Take 1 capsule (40 mg total) by mouth daily. 30 capsule 6   Facility-Administered Medications Prior to Visit  Medication Dose Route Frequency Provider Last Rate Last Admin  . 0.9 %  sodium chloride infusion  500 mL Intravenous Once Doran Stabler, MD        Allergies  Allergen Reactions  . Hydrocortisone Hives  . Lyrica [Pregabalin] Rash    ROS Review of Systems  Constitutional: Negative.   HENT: Negative.   Eyes: Negative.   Respiratory: Negative.   Cardiovascular: Negative.   Gastrointestinal: Negative.   Endocrine: Negative.   Genitourinary: Negative.   Musculoskeletal: Positive for back pain (chronic).       Bilateral wrist pain r/t carpal tunnel   Skin: Negative.   Allergic/Immunologic: Negative.   Neurological: Positive for dizziness (occasional ) and headaches (occasional ).  Hematological: Negative.   Psychiatric/Behavioral: Negative.       Objective:    Physical Exam Vitals and nursing note reviewed.  Constitutional:      Appearance: Normal appearance.  HENT:     Head: Normocephalic and atraumatic.     Nose: Nose normal.     Mouth/Throat:     Mouth: Mucous membranes are moist.     Pharynx: Oropharynx is clear.  Cardiovascular:     Rate and Rhythm: Normal rate and regular rhythm.     Pulses: Normal pulses.     Heart sounds: Normal heart sounds.  Pulmonary:     Effort: Pulmonary effort is normal.     Breath  sounds: Normal breath sounds.  Abdominal:     General: Bowel sounds are normal.     Palpations: Abdomen is soft.  Musculoskeletal:        General: Normal range of motion.     Cervical back: Normal range of motion and neck supple.  Skin:    General: Skin is warm and dry.  Neurological:     General: No focal deficit present.     Mental Status: She is alert and oriented to person, place, and time.  Psychiatric:        Mood and Affect: Mood normal.  Behavior: Behavior normal.        Thought Content: Thought content normal.        Judgment: Judgment normal.     BP 139/83 (BP Location: Right Arm, Patient Position: Sitting, Cuff Size: Normal)   Pulse 85   Temp (!) 97.3 F (36.3 C) (Temporal)   Ht 5\' 1"  (1.549 m)   Wt 220 lb (99.8 kg)   SpO2 98%   BMI 41.57 kg/m  Wt Readings from Last 3 Encounters:  01/05/21 220 lb (99.8 kg)  07/15/20 214 lb (97.1 kg)  07/10/20 216 lb (98 kg)     Health Maintenance Due  Topic Date Due  . COVID-19 Vaccine (1) Never done  . PAP SMEAR-Modifier  08/11/2019  . INFLUENZA VACCINE  06/08/2020    There are no preventive care reminders to display for this patient.  Lab Results  Component Value Date   TSH 1.180 03/12/2020   Lab Results  Component Value Date   WBC 4.3 03/12/2020   HGB 11.8 03/12/2020   HCT 35.2 03/12/2020   MCV 96 03/12/2020   PLT 289 03/12/2020   Lab Results  Component Value Date   NA 138 03/12/2020   K 4.0 03/12/2020   CO2 22 03/12/2020   GLUCOSE 99 03/12/2020   BUN 12 03/12/2020   CREATININE 0.75 03/12/2020   BILITOT 0.3 03/12/2020   ALKPHOS 66 03/12/2020   AST 16 03/12/2020   ALT 16 03/12/2020   PROT 7.4 03/12/2020   ALBUMIN 4.4 03/12/2020   CALCIUM 9.7 03/12/2020   ANIONGAP 6 06/04/2015   Lab Results  Component Value Date   CHOL 192 03/12/2020   Lab Results  Component Value Date   HDL 70 03/12/2020   Lab Results  Component Value Date   LDLCALC 104 (H) 03/12/2020   Lab Results  Component  Value Date   TRIG 103 03/12/2020   Lab Results  Component Value Date   CHOLHDL 2.7 03/12/2020   Lab Results  Component Value Date   HGBA1C 5.4 03/12/2020      Assessment & Plan:   1. Essential hypertension The current medical regimen is effective; blood pressure is stable at 139/83 today; continue present plan and medications as prescribed. She will continue to take medications as prescribed, to decrease high sodium intake, excessive alcohol intake, increase potassium intake, smoking cessation, and increase physical activity of at least 30 minutes of cardio activity daily. She will continue to follow Heart Healthy or DASH diet.  2. Carpal tunnel syndrome on both sides - Ambulatory referral to Orthopedic Surgery  3. Chronic pain of right wrist - Ambulatory referral to Orthopedic Surgery  4. Chronic bilateral low back pain with bilateral sciatica - Ambulatory referral to Orthopedic Surgery  5. Hot flashes due to menopause  6. Chronic foot pain, left  7. Class 3 severe obesity due to excess calories with serious comorbidity and body mass index (BMI) of 40.0 to 44.9 in adult [facility]) Body mass index is 41.57 kg/m. Goal BMI  is <30. Encouraged efforts to reduce weight include engaging in physical activity as tolerated with goal of 150 minutes per week. Improve dietary choices and eat a meal regimen consistent with a Mediterranean or DASH diet. Reduce simple carbohydrates. Do not skip meals and eat healthy snacks throughout the day to avoid over-eating at dinner. Set a goal weight loss that is achievable for you. - Amb Ref to Medical Weight Management  8. Follow up She will follow up 03/2021 for Annual Physical  and Labwork.   No orders of the defined types were placed in this encounter.   Orders Placed This Encounter  Procedures  . Ambulatory referral to Orthopedic Surgery  . Amb Ref to Medical Weight Management     Referral Orders     Ambulatory referral to Orthopedic  Surgery     Amb Ref to Medical Weight Management   Kathe Becton, MSN, ANE, FNP-BC [facility] [address] [place], [place] [address] [phone]- fax    Problem List Items Addressed This Visit      Cardiovascular and Mediastinum   Essential hypertension - Primary   Hot flashes due to menopause     Nervous and Auditory   Carpal tunnel syndrome on both sides   Relevant Orders   Ambulatory referral to Orthopedic Surgery     Other   Chronic foot pain, left   Low back pain   Relevant Orders   Ambulatory referral to Orthopedic Surgery   Obesity   Relevant Orders   Amb Ref to Medical Weight Management    Other Visit Diagnoses    Chronic pain of right wrist       Relevant Orders   Ambulatory referral to Orthopedic Surgery   Follow up          No orders of the defined types were placed in this encounter.   Follow-up: No follow-ups on file.    Azzie Glatter, FNP

## 2021-01-05 NOTE — Patient Instructions (Signed)
Calorie Counting for Weight Loss Calories are units of energy. Your body needs a certain number of calories from food to keep going throughout the day. When you eat or drink more calories than your body needs, your body stores the extra calories mostly as fat. When you eat or drink fewer calories than your body needs, your body burns fat to get the energy it needs. Calorie counting means keeping track of how many calories you eat and drink each day. Calorie counting can be helpful if you need to lose weight. If you eat fewer calories than your body needs, you should lose weight. Ask your health care provider what a healthy weight is for you. For calorie counting to work, you will need to eat the right number of calories each day to lose a healthy amount of weight per week. A dietitian can help you figure out how many calories you need in a day and will suggest ways to reach your calorie goal.  A healthy amount of weight to lose each week is usually 1-2 lb (0.5-0.9 kg). This usually means that your daily calorie intake should be reduced by 500-750 calories.  Eating 1,200-1,500 calories a day can help most women lose weight.  Eating 1,500-1,800 calories a day can help most men lose weight. What do I need to know about calorie counting? Work with your health care provider or dietitian to determine how many calories you should get each day. To meet your daily calorie goal, you will need to:  Find out how many calories are in each food that you would like to eat. Try to do this before you eat.  Decide how much of the food you plan to eat.  Keep a food log. Do this by writing down what you ate and how many calories it had. To successfully lose weight, it is important to balance calorie counting with a healthy lifestyle that includes regular activity. Where do I find calorie information? The number of calories in a food can be found on a Nutrition Facts label. If a food does not have a Nutrition Facts  label, try to look up the calories online or ask your dietitian for help. Remember that calories are listed per serving. If you choose to have more than one serving of a food, you will have to multiply the calories per serving by the number of servings you plan to eat. For example, the label on a package of bread might say that a serving size is 1 slice and that there are 90 calories in a serving. If you eat 1 slice, you will have eaten 90 calories. If you eat 2 slices, you will have eaten 180 calories.   How do I keep a food log? After each time that you eat, record the following in your food log as soon as possible:  What you ate. Be sure to include toppings, sauces, and other extras on the food.  How much you ate. This can be measured in cups, ounces, or number of items.  How many calories were in each food and drink.  The total number of calories in the food you ate. Keep your food log near you, such as in a pocket-sized notebook or on an app or website on your mobile phone. Some programs will calculate calories for you and show you how many calories you have left to meet your daily goal. What are some portion-control tips?  Know how many calories are in a serving. This will   help you know how many servings you can have of a certain food.  Use a measuring cup to measure serving sizes. You could also try weighing out portions on a kitchen scale. With time, you will be able to estimate serving sizes for some foods.  Take time to put servings of different foods on your favorite plates or in your favorite bowls and cups so you know what a serving looks like.  Try not to eat straight from a food's packaging, such as from a bag or box. Eating straight from the package makes it hard to see how much you are eating and can lead to overeating. Put the amount you would like to eat in a cup or on a plate to make sure you are eating the right portion.  Use smaller plates, glasses, and bowls for smaller  portions and to prevent overeating.  Try not to multitask. For example, avoid watching TV or using your computer while eating. If it is time to eat, sit down at a table and enjoy your food. This will help you recognize when you are full. It will also help you be more mindful of what and how much you are eating. What are tips for following this plan? Reading food labels  Check the calorie count compared with the serving size. The serving size may be smaller than what you are used to eating.  Check the source of the calories. Try to choose foods that are high in protein, fiber, and vitamins, and low in saturated fat, trans fat, and sodium. Shopping  Read nutrition labels while you shop. This will help you make healthy decisions about which foods to buy.  Pay attention to nutrition labels for low-fat or fat-free foods. These foods sometimes have the same number of calories or more calories than the full-fat versions. They also often have added sugar, starch, or salt to make up for flavor that was removed with the fat.  Make a grocery list of lower-calorie foods and stick to it. Cooking  Try to cook your favorite foods in a healthier way. For example, try baking instead of frying.  Use low-fat dairy products. Meal planning  Use more fruits and vegetables. One-half of your plate should be fruits and vegetables.  Include lean proteins, such as chicken, turkey, and fish. Lifestyle Each week, aim to do one of the following:  150 minutes of moderate exercise, such as walking.  75 minutes of vigorous exercise, such as running. General information  Know how many calories are in the foods you eat most often. This will help you calculate calorie counts faster.  Find a way of tracking calories that works for you. Get creative. Try different apps or programs if writing down calories does not work for you. What foods should I eat?  Eat nutritious foods. It is better to have a nutritious,  high-calorie food, such as an avocado, than a food with few nutrients, such as a bag of potato chips.  Use your calories on foods and drinks that will fill you up and will not leave you hungry soon after eating. ? Examples of foods that fill you up are nuts and nut butters, vegetables, lean proteins, and high-fiber foods such as whole grains. High-fiber foods are foods with more than 5 g of fiber per serving.  Pay attention to calories in drinks. Low-calorie drinks include water and unsweetened drinks. The items listed above may not be a complete list of foods and beverages you can eat.   Contact a dietitian for more information.   What foods should I limit? Limit foods or drinks that are not good sources of vitamins, minerals, or protein or that are high in unhealthy fats. These include:  Candy.  Other sweets.  Sodas, specialty coffee drinks, alcohol, and juice. The items listed above may not be a complete list of foods and beverages you should avoid. Contact a dietitian for more information. How do I count calories when eating out?  Pay attention to portions. Often, portions are much larger when eating out. Try these tips to keep portions smaller: ? Consider sharing a meal instead of getting your own. ? If you get your own meal, eat only half of it. Before you start eating, ask for a container and put half of your meal into it. ? When available, consider ordering smaller portions from the menu instead of full portions.  Pay attention to your food and drink choices. Knowing the way food is cooked and what is included with the meal can help you eat fewer calories. ? If calories are listed on the menu, choose the lower-calorie options. ? Choose dishes that include vegetables, fruits, whole grains, low-fat dairy products, and lean proteins. ? Choose items that are boiled, broiled, grilled, or steamed. Avoid items that are buttered, battered, fried, or served with cream sauce. Items labeled as  crispy are usually fried, unless stated otherwise. ? Choose water, low-fat milk, unsweetened iced tea, or other drinks without added sugar. If you want an alcoholic beverage, choose a lower-calorie option, such as a glass of wine or light beer. ? Ask for dressings, sauces, and syrups on the side. These are usually high in calories, so you should limit the amount you eat. ? If you want a salad, choose a garden salad and ask for grilled meats. Avoid extra toppings such as bacon, cheese, or fried items. Ask for the dressing on the side, or ask for olive oil and vinegar or lemon to use as dressing.  Estimate how many servings of a food you are given. Knowing serving sizes will help you be aware of how much food you are eating at restaurants. Where to find more information  Centers for Disease Control and Prevention: www.cdc.gov  [place] Department of Agriculture: myplate.gov Summary  Calorie counting means keeping track of how many calories you eat and drink each day. If you eat fewer calories than your body needs, you should lose weight.  A healthy amount of weight to lose per week is usually 1-2 lb (0.5-0.9 kg). This usually means reducing your daily calorie intake by 500-750 calories.  The number of calories in a food can be found on a Nutrition Facts label. If a food does not have a Nutrition Facts label, try to look up the calories online or ask your dietitian for help.  Use smaller plates, glasses, and bowls for smaller portions and to prevent overeating.  Use your calories on foods and drinks that will fill you up and not leave you hungry shortly after a meal. This information is not intended to replace advice given to you by your health care provider. Make sure you discuss any questions you have with your health care provider. Document Revised: 12/06/2019 Document Reviewed: 12/06/2019 Elsevier Patient Education  2021 [employer] Inc.  

## 2021-01-19 ENCOUNTER — Other Ambulatory Visit: Payer: Self-pay

## 2021-01-19 ENCOUNTER — Ambulatory Visit (INDEPENDENT_AMBULATORY_CARE_PROVIDER_SITE_OTHER): Payer: Medicare Other | Admitting: Orthopaedic Surgery

## 2021-01-19 ENCOUNTER — Encounter: Payer: Self-pay | Admitting: Physician Assistant

## 2021-01-19 ENCOUNTER — Ambulatory Visit: Payer: Self-pay

## 2021-01-19 ENCOUNTER — Other Ambulatory Visit: Payer: Self-pay | Admitting: Physician Assistant

## 2021-01-19 DIAGNOSIS — M25531 Pain in right wrist: Secondary | ICD-10-CM

## 2021-01-19 DIAGNOSIS — M25522 Pain in left elbow: Secondary | ICD-10-CM | POA: Diagnosis not present

## 2021-01-19 MED ORDER — DICLOFENAC SODIUM 75 MG PO TBEC: 75.0000 mg | DELAYED_RELEASE_TABLET | Freq: Two times a day (BID) | ORAL | 2 refills | Status: DC | PRN

## 2021-01-19 NOTE — Progress Notes (Signed)
Office Visit Note   Patient: Deanna Floyd           Date of Birth: 09-02-1961           MRN: [id number] Visit Date: 01/19/2021              Requested by: Azzie Glatter, [address] [place],  [place] [id number] PCP: Azzie Glatter, FNP   Assessment & Plan: Visit Diagnoses:  1. Pain in right wrist   2. Pain in left elbow     Plan: Impression is right slack wrist and left medial epicondylitis.  In regards to the wrist, we have discussed treatment options to include NSAIDs versus wrist injection and bracing.  She is not interested in injections today.  We will try a course of anti-inflammatories as well as a wrist splint.  She will follow up with [place] as needed.  In regards to the elbow, we have also discussed cortisone injection for which she is not interested.  I provided her with a stretching program.  Follow-up with [place] as needed.  Follow-Up Instructions: Return if symptoms worsen or fail to improve.   Orders:  Orders Placed This Encounter  Procedures  . XR Wrist Complete Right  . XR Elbow Complete Left (3+View)   Meds ordered this encounter  Medications  . diclofenac (VOLTAREN) 75 MG EC tablet    Sig: Take 1 tablet (75 mg total) by mouth 2 (two) times daily as needed.    Dispense:  60 tablet    Refill:  2      Procedures: No procedures performed   Clinical Data: No additional findings.   Subjective: Chief Complaint  Patient presents with  . Right Wrist - Pain  . Left Elbow - Pain    HPI patient is a pleasant 60 year old right-hand-dominant female who comes in today with right wrist and left elbow pain.  The right wrist has been bothering her for several years and has progressively worsened.  The pain she has is primarily to the dorsum of the wrist and is worse with cold and rainy weather.  She has associated stiffness.  She has been using anti-inflammatory creams as well as hot compresses without significant relief.  She uses a glove at times which  does seem to help.  She denies any paresthesias.  In regards to the left elbow, she has had pain to the medial aspect after sustaining a fall about 3 months ago.  Her pain has slightly improved over time.  She does describe this as an ache which is tender to the touch.  No paresthesias.  Review of Systems as detailed in HPI.  All others reviewed and are negative.   Objective: Vital Signs: There were no vitals taken for this visit.  Physical Exam well-developed well-nourished female no acute distress.  Alert oriented x3.  Ortho Exam right wrist exam shows moderate and diffuse tenderness throughout.  She does not have any increased pain with range of motion about the wrist.  No focal weakness.  She is neurovascular intact distally.  Left elbow shows moderate tenderness to the medial epicondyle.  Slight increased pain with wrist flexion.  She is neurovascularly intact distally.  Specialty Comments:  No specialty comments available.  Imaging: XR Elbow Complete Left (3+View)  Result Date: 01/19/2021 No acute or structural abnormalities  XR Wrist Complete Right  Result Date: 01/19/2021 X-rays demonstrate scapholunate dissociation and degenerative changes consistent with [facility] wrist.    PMFS History: Patient Active  Problem List   Diagnosis Date Noted  . Right wrist pain 03/27/2020  . Neuropathy 08/21/2019  . Chronic foot pain, left 08/21/2019  . Seasonal allergies 08/21/2019  . Right upper quadrant pain 05/08/2019  . Gastroesophageal reflux disease with esophagitis 05/08/2019  . Hot flashes due to menopause 05/08/2019  . Carpal tunnel syndrome on both sides 11/19/2016  . Metabolic syndrome 72/62/0355  . Essential hypertension 11/19/2016  . Allergic dermatitis 11/19/2016  . Allergy status to unspecified drugs, medicaments and biological substances status 11/19/2016  . BP check 09/22/2016  . Post-operative state 06/02/2016  . Obesity 01/08/2016  . Low back pain 10/06/2015  . Pain  in joint, ankle and foot 10/06/2015   Past Medical History:  Diagnosis Date  . GERD (gastroesophageal reflux disease)   . Hypertension   . Stroke [facility]) 2007    Family History  Problem Relation Age of Onset  . Hypertension Sister   . Colon cancer Neg Hx   . Stomach cancer Neg Hx     Past Surgical History:  Procedure Laterality Date  . FOOT SURGERY  2015   left side had 2 surgeries in 2015  . TRIGGER FINGER RELEASE  2010   Social History   Occupational History  . Not on file  Tobacco Use  . Smoking status: Former Research scientist (life sciences)  . Smokeless tobacco: Never Used  Vaping Use  . Vaping Use: Never used  Substance and Sexual Activity  . Alcohol use: Yes    Comment: occ wine   . Drug use: No  . Sexual activity: Not Currently

## 2021-01-29 ENCOUNTER — Other Ambulatory Visit: Payer: Self-pay | Admitting: Family Medicine

## 2021-01-29 DIAGNOSIS — K219 Gastro-esophageal reflux disease without esophagitis: Secondary | ICD-10-CM

## 2021-01-30 ENCOUNTER — Other Ambulatory Visit: Payer: Self-pay | Admitting: Family Medicine

## 2021-01-30 MED FILL — OMEPRAZOLE DR 40 MG CAPSULE: 40 | 30 days supply | Qty: 30 | Fill #0

## 2021-02-16 ENCOUNTER — Other Ambulatory Visit: Payer: Self-pay

## 2021-02-16 ENCOUNTER — Ambulatory Visit (INDEPENDENT_AMBULATORY_CARE_PROVIDER_SITE_OTHER): Payer: Medicare Other | Admitting: Family Medicine

## 2021-02-16 ENCOUNTER — Encounter: Payer: Self-pay | Admitting: Family Medicine

## 2021-02-16 VITALS — BP 132/88 | HR 84 | Ht 61.0 in | Wt 222.0 lb

## 2021-02-16 DIAGNOSIS — G5603 Carpal tunnel syndrome, bilateral upper limbs: Secondary | ICD-10-CM

## 2021-02-16 DIAGNOSIS — M25531 Pain in right wrist: Secondary | ICD-10-CM

## 2021-02-16 DIAGNOSIS — K219 Gastro-esophageal reflux disease without esophagitis: Secondary | ICD-10-CM | POA: Diagnosis not present

## 2021-02-16 DIAGNOSIS — N951 Menopausal and female climacteric states: Secondary | ICD-10-CM | POA: Diagnosis not present

## 2021-02-16 DIAGNOSIS — I10 Essential (primary) hypertension: Secondary | ICD-10-CM | POA: Diagnosis not present

## 2021-02-16 DIAGNOSIS — E66813 Obesity, class 3: Secondary | ICD-10-CM

## 2021-02-16 DIAGNOSIS — Z760 Encounter for issue of repeat prescription: Secondary | ICD-10-CM

## 2021-02-16 DIAGNOSIS — Z09 Encounter for follow-up examination after completed treatment for conditions other than malignant neoplasm: Secondary | ICD-10-CM

## 2021-02-16 DIAGNOSIS — M5442 Lumbago with sciatica, left side: Secondary | ICD-10-CM

## 2021-02-16 DIAGNOSIS — Z6841 Body Mass Index (BMI) 40.0 and over, adult: Secondary | ICD-10-CM

## 2021-02-16 DIAGNOSIS — M5441 Lumbago with sciatica, right side: Secondary | ICD-10-CM

## 2021-02-16 DIAGNOSIS — G8929 Other chronic pain: Secondary | ICD-10-CM

## 2021-02-16 DIAGNOSIS — Z0000 Encounter for general adult medical examination without abnormal findings: Secondary | ICD-10-CM | POA: Diagnosis not present

## 2021-02-16 LAB — POCT URINALYSIS DIPSTICK
Bilirubin, UA: NEGATIVE
Blood, UA: NEGATIVE
Glucose, UA: NEGATIVE
Ketones, UA: NEGATIVE
Leukocytes, UA: NEGATIVE
Nitrite, UA: NEGATIVE
Protein, UA: NEGATIVE
Spec Grav, UA: >=1.03 — AB (ref 1.010–1.025)
Urobilinogen, UA: 0.2 E.U./dL
pH, UA: 6 (ref 5.0–8.0)

## 2021-02-16 MED ORDER — CYCLOBENZAPRINE HCL 10 MG PO TABS
10.0000 mg | ORAL_TABLET | Freq: Three times a day (TID) | ORAL | 6 refills | Status: DC | PRN
Start: 2021-02-16 — End: 2021-08-19
  Filled 2021-02-16: qty 30, 10d supply, fill #0

## 2021-02-16 MED ORDER — GABAPENTIN 300 MG PO CAPS
300.0000 mg | ORAL_CAPSULE | Freq: Two times a day (BID) | ORAL | 3 refills | Status: DC
  Filled 2021-02-16: qty 60, 30d supply, fill #0

## 2021-02-16 MED ORDER — LISINOPRIL-HYDROCHLOROTHIAZIDE 20-12.5 MG PO TABS
1.0000 | ORAL_TABLET | Freq: Every day | ORAL | 3 refills | Status: DC
  Filled 2021-02-16: qty 30, 30d supply, fill #0
  Filled 2021-02-25: qty 90, 90d supply, fill #0
  Filled 2021-05-22: qty 90, 90d supply, fill #1

## 2021-02-16 MED ORDER — OMEPRAZOLE 40 MG PO CPDR
DELAYED_RELEASE_CAPSULE | Freq: Every day | ORAL | 11 refills | Status: DC
  Filled 2021-02-16: qty 30, fill #0
  Filled 2021-02-25: qty 30, 30d supply, fill #0
  Filled 2021-04-05: qty 30, 30d supply, fill #1
  Filled 2021-05-07: qty 30, 30d supply, fill #2
  Filled 2021-06-04: qty 90, 90d supply, fill #3

## 2021-02-16 MED ORDER — AMLODIPINE BESYLATE 5 MG PO TABS
ORAL_TABLET | Freq: Every day | ORAL | 3 refills | Status: DC
  Filled 2021-02-16: qty 30, 30d supply, fill #0
  Filled 2021-02-25: qty 90, 90d supply, fill #0
  Filled 2021-05-22: qty 90, 90d supply, fill #1

## 2021-02-16 MED ORDER — DICLOFENAC SODIUM 75 MG PO TBEC
DELAYED_RELEASE_TABLET | ORAL | 6 refills | Status: DC
  Filled 2021-02-16: qty 60, 30d supply, fill #0

## 2021-02-16 MED ORDER — CETIRIZINE HCL 10 MG PO TABS
10.0000 mg | ORAL_TABLET | Freq: Every day | ORAL | 11 refills | Status: DC
  Filled 2021-02-16: qty 30, 30d supply, fill #0

## 2021-02-16 MED ORDER — DICLOFENAC SODIUM 1 % EX GEL
4.0000 g | Freq: Four times a day (QID) | CUTANEOUS | 6 refills | Status: DC
  Filled 2021-02-16: qty 100, 6d supply, fill #0

## 2021-02-16 NOTE — Progress Notes (Signed)
Patient [facility]   Established Patient Office Visit  Subjective:  Patient ID: Deanna Floyd, female    DOB: May 26, 1961  Age: 60 y.o. MRN: [id number]  CC:  Chief Complaint  Patient presents with  . Annual Exam    HPI Deanna Floyd is a 60 year old female who presents for Follow Up today.   Patient Active Problem List   Diagnosis Date Noted  . Right wrist pain 03/27/2020  . Neuropathy 08/21/2019  . Chronic foot pain, left 08/21/2019  . Seasonal allergies 08/21/2019  . Right upper quadrant pain 05/08/2019  . Gastroesophageal reflux disease with esophagitis 05/08/2019  . Hot flashes due to menopause 05/08/2019  . Carpal tunnel syndrome on both sides 11/19/2016  . Metabolic syndrome 56/43/3295  . Essential hypertension 11/19/2016  . Allergic dermatitis 11/19/2016  . Allergy status to unspecified drugs, medicaments and biological substances status 11/19/2016  . BP check 09/22/2016  . Post-operative state 06/02/2016  . Obesity 01/08/2016  . Low back pain 10/06/2015  . Pain in joint, ankle and foot 10/06/2015   Current Status: Since her last office visit, she states that chronic bilateral wrist pain is moderate today. She denies fevers, chills, fatigue, recent infections, weight loss, and night sweats. She has not had any headaches, visual changes, dizziness, and falls. No chest pain, heart palpitations, cough and shortness of breath reported. Denies GI problems such as nausea, vomiting, diarrhea, and constipation. She has no reports of blood in stools, dysuria and hematuria. No depression or anxiety reported today. She is taking all medications as prescribed.   Past Medical History:  Diagnosis Date  . GERD (gastroesophageal reflux disease)   . Hypertension   . Stroke [facility]) 2007    Past Surgical History:  Procedure Laterality Date  . FOOT SURGERY  2015   left side had 2 surgeries in 2015  . TRIGGER FINGER RELEASE  2010     Family History  Problem Relation Age of Onset  . Hypertension Sister   . Colon cancer Neg Hx   . Stomach cancer Neg Hx     Social History   Socioeconomic History  . Marital status: Single    Spouse name: Not on file  . Number of children: Not on file  . Years of education: Not on file  . Highest education level: Not on file  Occupational History  . Not on file  Tobacco Use  . Smoking status: Former Research scientist (life sciences)  . Smokeless tobacco: Never Used  Vaping Use  . Vaping Use: Never used  Substance and Sexual Activity  . Alcohol use: Yes    Comment: occ wine   . Drug use: No  . Sexual activity: Not Currently  Other Topics Concern  . Not on file  Social History Narrative  . Not on file   Social Determinants of Health   Financial Resource Strain: Not on file  Food Insecurity: Not on file  Transportation Needs: Not on file  Physical Activity: Not on file  Stress: Not on file  Social Connections: Not on file  Intimate Partner Violence: Not on file    Outpatient Medications Prior to Visit  Medication Sig Dispense Refill  . acetaminophen (TYLENOL) 500 MG tablet Take 1 tablet (500 mg total) by mouth every 6 (six) hours as needed. 30 tablet 6  . diphenhydrAMINE (BENADRYL) 2 % cream Apply topically 3 (three) times daily as needed for itching. 30 g 6  . Latanoprostene Bunod 0.024 % SOLN  PLACE 1 DROP BOTH EYES EVERY EVENING 7.5 mL 4  . Multiple Vitamin (MULTIVITAMIN WITH MINERALS) TABS tablet Take 1 tablet by mouth daily.    . Olopatadine HCl 0.2 % SOLN One drop into each eye daily, as needed. 2.5 mL 6  . amLODipine (NORVASC) 5 MG tablet TAKE 1 TABLET (5 MG TOTAL) BY MOUTH DAILY. 30 tablet 6  . cetirizine (ZYRTEC) 10 MG tablet Take 1 tablet (10 mg total) by mouth daily. 30 tablet 11  . cyclobenzaprine (FLEXERIL) 10 MG tablet Take 1 tablet (10 mg total) by mouth 3 (three) times daily as needed for muscle spasms. 30 tablet 6  . diclofenac (VOLTAREN) 75 MG EC tablet TAKE 1 TABLET (75  MG TOTAL) BY MOUTH 2 (TWO) TIMES DAILY AS NEEDED. 60 tablet 2  . diclofenac Sodium (VOLTAREN) 1 % GEL Apply 4 g topically 4 (four) times daily. 4 g 6  . gabapentin (NEURONTIN) 300 MG capsule Take 1 capsule (300 mg total) by mouth 2 (two) times daily. As needed. 60 capsule 6  . ibuprofen (ADVIL) 800 MG tablet Take 1 tablet (800 mg total) by mouth every 8 (eight) hours as needed. 30 tablet 6  . lisinopril-hydrochlorothiazide (ZESTORETIC) 20-12.5 MG tablet TAKE 1 TABLET BY MOUTH DAILY. 90 tablet 3  . omeprazole (PRILOSEC) 40 MG capsule TAKE 1 CAPSULE (40 MG TOTAL) BY MOUTH DAILY. 30 capsule 6  . 0.9 %  sodium chloride infusion      No facility-administered medications prior to visit.    Allergies  Allergen Reactions  . Hydrocortisone Hives  . Lyrica [Pregabalin] Rash   ROS Review of Systems  Constitutional: Negative.   HENT: Negative.   Eyes: Negative.   Respiratory: Negative.   Cardiovascular: Negative.   Gastrointestinal: Positive for abdominal distention (obese).  Endocrine: Negative.   Genitourinary: Negative.   Musculoskeletal: Positive for arthralgias (generalized joint pain).       Bilateral wrist pain  Skin: Negative.   Allergic/Immunologic: Negative.   Neurological: Positive for dizziness (occasional ) and headaches (occasional ).  Hematological: Negative.   Psychiatric/Behavioral: Negative.     Objective:    Physical Exam Vitals and nursing note reviewed.  Constitutional:      Appearance: Normal appearance.  HENT:     Head: Normocephalic and atraumatic.     Nose: Nose normal.     Mouth/Throat:     Mouth: Mucous membranes are moist.     Pharynx: Oropharynx is clear.  Cardiovascular:     Rate and Rhythm: Normal rate and regular rhythm.     Pulses: Normal pulses.     Heart sounds: Normal heart sounds.  Pulmonary:     Effort: Pulmonary effort is normal.     Breath sounds: Normal breath sounds.  Abdominal:     General: Bowel sounds are normal.     Palpations:  Abdomen is soft.  Musculoskeletal:        General: Normal range of motion.     Cervical back: Normal range of motion and neck supple.  Skin:    General: Skin is warm and dry.  Neurological:     General: No focal deficit present.     Mental Status: She is alert and oriented to person, place, and time.  Psychiatric:        Mood and Affect: Mood normal.        Behavior: Behavior normal.        Thought Content: Thought content normal.        Judgment:  Judgment normal.    BP 132/88   Pulse 84   Ht 5\' 1"  (1.549 m)   Wt 222 lb (100.7 kg)   SpO2 100%   BMI 41.95 kg/m  Wt Readings from Last 3 Encounters:  02/16/21 222 lb (100.7 kg)  01/05/21 220 lb (99.8 kg)  07/15/20 214 lb (97.1 kg)    Health Maintenance Due  Topic Date Due  . COVID-19 Vaccine (1) Never done    There are no preventive care reminders to display for this patient.  Lab Results  Component Value Date   TSH 1.180 03/12/2020   Lab Results  Component Value Date   WBC 4.3 03/12/2020   HGB 11.8 03/12/2020   HCT 35.2 03/12/2020   MCV 96 03/12/2020   PLT 289 03/12/2020   Lab Results  Component Value Date   NA 138 03/12/2020   K 4.0 03/12/2020   CO2 22 03/12/2020   GLUCOSE 99 03/12/2020   BUN 12 03/12/2020   CREATININE 0.75 03/12/2020   BILITOT 0.3 03/12/2020   ALKPHOS 66 03/12/2020   AST 16 03/12/2020   ALT 16 03/12/2020   PROT 7.4 03/12/2020   ALBUMIN 4.4 03/12/2020   CALCIUM 9.7 03/12/2020   ANIONGAP 6 06/04/2015   Lab Results  Component Value Date   CHOL 192 03/12/2020   Lab Results  Component Value Date   HDL 70 03/12/2020   Lab Results  Component Value Date   LDLCALC 104 (H) 03/12/2020   Lab Results  Component Value Date   TRIG 103 03/12/2020   Lab Results  Component Value Date   CHOLHDL 2.7 03/12/2020   Lab Results  Component Value Date   HGBA1C 5.4 03/12/2020    Assessment & Plan:   1. Essential hypertension The current medical regimen is effective; blood pressure is  stable at 132/88 today; continue present plan and medications as prescribed. She will continue to take medications as prescribed, to decrease high sodium intake, excessive alcohol intake, increase potassium intake, smoking cessation, and increase physical activity of at least 30 minutes of cardio activity daily. She will continue to follow Heart Healthy or DASH diet. - POCT urinalysis dipstick - amLODipine (NORVASC) 5 MG tablet; TAKE 1 TABLET (5 MG TOTAL) BY MOUTH DAILY.  Dispense: 90 tablet; Refill: 3 - lisinopril-hydrochlorothiazide (ZESTORETIC) 20-12.5 MG tablet; TAKE 1 TABLET BY MOUTH DAILY.  Dispense: 90 tablet; Refill: 3  2. Carpal tunnel syndrome on both sides  3. Chronic pain of right wrist - diclofenac Sodium (VOLTAREN) 1 % GEL; Apply 4 g topically 4 (four) times daily.  Dispense: 4 g; Refill: 6  4. Hot flashes due to menopause - gabapentin (NEURONTIN) 300 MG capsule; Take 1 capsule (300 mg total) by mouth 2 (two) times daily. As needed.  Dispense: 180 capsule; Refill: 3  5. Class 3 severe obesity due to excess calories with serious comorbidity and body mass index (BMI) of 40.0 to 44.9 in adult [facility]) Body mass index is 41.95 kg/m. Goal BMI  is <30. Encouraged efforts to reduce weight include engaging in physical activity as tolerated with goal of 150 minutes per week. Improve dietary choices and eat a meal regimen consistent with a Mediterranean or DASH diet. Reduce simple carbohydrates. Do not skip meals and eat healthy snacks throughout the day to avoid over-eating at dinner. Set a goal weight loss that is achievable for you.  6. Medication refill - amLODipine (NORVASC) 5 MG tablet; TAKE 1 TABLET (5 MG TOTAL) BY MOUTH DAILY.  Dispense:  90 tablet; Refill: 3 - cetirizine (ZYRTEC) 10 MG tablet; Take 1 tablet (10 mg total) by mouth daily.  Dispense: 30 tablet; Refill: 11 - cyclobenzaprine (FLEXERIL) 10 MG tablet; Take 1 tablet (10 mg total) by mouth 3 (three) times daily as needed for  muscle spasms.  Dispense: 30 tablet; Refill: 6 - diclofenac (VOLTAREN) 75 MG EC tablet; TAKE 1 TABLET (75 MG TOTAL) BY MOUTH 2 (TWO) TIMES DAILY AS NEEDED.  Dispense: 60 tablet; Refill: 6 - diclofenac Sodium (VOLTAREN) 1 % GEL; Apply 4 g topically 4 (four) times daily.  Dispense: 4 g; Refill: 6 - gabapentin (NEURONTIN) 300 MG capsule; Take 1 capsule (300 mg total) by mouth 2 (two) times daily. As needed.  Dispense: 180 capsule; Refill: 3 - lisinopril-hydrochlorothiazide (ZESTORETIC) 20-12.5 MG tablet; TAKE 1 TABLET BY MOUTH DAILY.  Dispense: 90 tablet; Refill: 3 - omeprazole (PRILOSEC) 40 MG capsule; TAKE 1 CAPSULE (40 MG TOTAL) BY MOUTH DAILY.  Dispense: 30 capsule; Refill: 11  7. Chronic bilateral low back pain with bilateral sciatica - cyclobenzaprine (FLEXERIL) 10 MG tablet; Take 1 tablet (10 mg total) by mouth 3 (three) times daily as needed for muscle spasms.  Dispense: 30 tablet; Refill: 6  8. Gastroesophageal reflux disease without esophagitis - omeprazole (PRILOSEC) 40 MG capsule; TAKE 1 CAPSULE (40 MG TOTAL) BY MOUTH DAILY.  Dispense: 30 capsule; Refill: 11  9. Healthcare maintenance - CBC with Differential - Comprehensive metabolic panel; Future - TSH - Vitamin B12 - Vitamin D, 25-hydroxy - Lipid Panel  10. Follow up She will follow up in 6 months.   Meds ordered this encounter  Medications  . amLODipine (NORVASC) 5 MG tablet    Sig: TAKE 1 TABLET (5 MG TOTAL) BY MOUTH DAILY.    Dispense:  90 tablet    Refill:  3  . cetirizine (ZYRTEC) 10 MG tablet    Sig: Take 1 tablet (10 mg total) by mouth daily.    Dispense:  30 tablet    Refill:  11  . cyclobenzaprine (FLEXERIL) 10 MG tablet    Sig: Take 1 tablet (10 mg total) by mouth 3 (three) times daily as needed for muscle spasms.    Dispense:  30 tablet    Refill:  6  . diclofenac (VOLTAREN) 75 MG EC tablet    Sig: TAKE 1 TABLET (75 MG TOTAL) BY MOUTH 2 (TWO) TIMES DAILY AS NEEDED.    Dispense:  60 tablet    Refill:  6   . diclofenac Sodium (VOLTAREN) 1 % GEL    Sig: Apply 4 g topically 4 (four) times daily.    Dispense:  4 g    Refill:  6  . gabapentin (NEURONTIN) 300 MG capsule    Sig: Take 1 capsule (300 mg total) by mouth 2 (two) times daily. As needed.    Dispense:  180 capsule    Refill:  3  . lisinopril-hydrochlorothiazide (ZESTORETIC) 20-12.5 MG tablet    Sig: TAKE 1 TABLET BY MOUTH DAILY.    Dispense:  90 tablet    Refill:  3  . omeprazole (PRILOSEC) 40 MG capsule    Sig: TAKE 1 CAPSULE (40 MG TOTAL) BY MOUTH DAILY.    Dispense:  30 capsule    Refill:  11    Orders Placed This Encounter  Procedures  . CBC with Differential  . Comprehensive metabolic panel  . TSH  . Vitamin B12  . Vitamin D, 25-hydroxy  . Lipid Panel  . POCT urinalysis dipstick  Referral Orders  No referral(s) requested today    Kathe Becton, MSN, ANE, FNP-BC [facility] [address] [place], [place] [address] [phone] fax   Meds ordered this encounter  Medications  . amLODipine (NORVASC) 5 MG tablet    Sig: TAKE 1 TABLET (5 MG TOTAL) BY MOUTH DAILY.    Dispense:  90 tablet    Refill:  3  . cetirizine (ZYRTEC) 10 MG tablet    Sig: Take 1 tablet (10 mg total) by mouth daily.    Dispense:  30 tablet    Refill:  11  . cyclobenzaprine (FLEXERIL) 10 MG tablet    Sig: Take 1 tablet (10 mg total) by mouth 3 (three) times daily as needed for muscle spasms.    Dispense:  30 tablet    Refill:  6  . diclofenac (VOLTAREN) 75 MG EC tablet    Sig: TAKE 1 TABLET (75 MG TOTAL) BY MOUTH 2 (TWO) TIMES DAILY AS NEEDED.    Dispense:  60 tablet    Refill:  6  . diclofenac Sodium (VOLTAREN) 1 % GEL    Sig: Apply 4 g topically 4 (four) times daily.    Dispense:  4 g    Refill:  6  . gabapentin (NEURONTIN) 300 MG capsule    Sig: Take 1 capsule (300 mg total) by mouth 2 (two) times daily. As needed.     Dispense:  180 capsule    Refill:  3  . lisinopril-hydrochlorothiazide (ZESTORETIC) 20-12.5 MG tablet    Sig: TAKE 1 TABLET BY MOUTH DAILY.    Dispense:  90 tablet    Refill:  3  . omeprazole (PRILOSEC) 40 MG capsule    Sig: TAKE 1 CAPSULE (40 MG TOTAL) BY MOUTH DAILY.    Dispense:  30 capsule    Refill:  11   Orders Placed This Encounter  Procedures  . CBC with Differential  . Comprehensive metabolic panel  . TSH  . Vitamin B12  . Vitamin D, 25-hydroxy  . Lipid Panel  . POCT urinalysis dipstick    Referral Orders  No referral(s) requested today    Kathe Becton, MSN, ANE, FNP-BC [facility] [place], [place] [address] [phone] fax    Problem List Items Addressed This Visit      Cardiovascular and Mediastinum   Essential hypertension - Primary   Relevant Medications   amLODipine (NORVASC) 5 MG tablet   lisinopril-hydrochlorothiazide (ZESTORETIC) 20-12.5 MG tablet   Other Relevant Orders   POCT urinalysis dipstick (Completed)   Hot flashes due to menopause   Relevant Medications   amLODipine (NORVASC) 5 MG tablet   gabapentin (NEURONTIN) 300 MG capsule   lisinopril-hydrochlorothiazide (ZESTORETIC) 20-12.5 MG tablet     Nervous and Auditory   Carpal tunnel syndrome on both sides   Relevant Medications   cyclobenzaprine (FLEXERIL) 10 MG tablet   gabapentin (NEURONTIN) 300 MG capsule     Other   Low back pain   Relevant Medications   cyclobenzaprine (FLEXERIL) 10 MG tablet   diclofenac (VOLTAREN) 75 MG EC tablet   Obesity    Other Visit Diagnoses    Chronic pain of right wrist       Relevant Medications   cyclobenzaprine (FLEXERIL) 10 MG tablet   diclofenac (VOLTAREN) 75 MG EC tablet   diclofenac Sodium (VOLTAREN) 1 %  GEL   gabapentin (NEURONTIN) 300 MG capsule   Medication refill       Relevant Medications   amLODipine (NORVASC) 5  MG tablet   cetirizine (ZYRTEC) 10 MG tablet   cyclobenzaprine (FLEXERIL) 10 MG tablet   diclofenac (VOLTAREN) 75 MG EC tablet   diclofenac Sodium (VOLTAREN) 1 % GEL   gabapentin (NEURONTIN) 300 MG capsule   lisinopril-hydrochlorothiazide (ZESTORETIC) 20-12.5 MG tablet   omeprazole (PRILOSEC) 40 MG capsule   Gastroesophageal reflux disease without esophagitis       Relevant Medications   omeprazole (PRILOSEC) 40 MG capsule   Healthcare maintenance       Relevant Orders   CBC with Differential   Comprehensive metabolic panel   TSH   Vitamin B12   Vitamin D, 25-hydroxy   Lipid Panel   Follow up          Meds ordered this encounter  Medications  . amLODipine (NORVASC) 5 MG tablet    Sig: TAKE 1 TABLET (5 MG TOTAL) BY MOUTH DAILY.    Dispense:  90 tablet    Refill:  3  . cetirizine (ZYRTEC) 10 MG tablet    Sig: Take 1 tablet (10 mg total) by mouth daily.    Dispense:  30 tablet    Refill:  11  . cyclobenzaprine (FLEXERIL) 10 MG tablet    Sig: Take 1 tablet (10 mg total) by mouth 3 (three) times daily as needed for muscle spasms.    Dispense:  30 tablet    Refill:  6  . diclofenac (VOLTAREN) 75 MG EC tablet    Sig: TAKE 1 TABLET (75 MG TOTAL) BY MOUTH 2 (TWO) TIMES DAILY AS NEEDED.    Dispense:  60 tablet    Refill:  6  . diclofenac Sodium (VOLTAREN) 1 % GEL    Sig: Apply 4 g topically 4 (four) times daily.    Dispense:  4 g    Refill:  6  . gabapentin (NEURONTIN) 300 MG capsule    Sig: Take 1 capsule (300 mg total) by mouth 2 (two) times daily. As needed.    Dispense:  180 capsule    Refill:  3  . lisinopril-hydrochlorothiazide (ZESTORETIC) 20-12.5 MG tablet    Sig: TAKE 1 TABLET BY MOUTH DAILY.    Dispense:  90 tablet    Refill:  3  . omeprazole (PRILOSEC) 40 MG capsule    Sig: TAKE 1 CAPSULE (40 MG TOTAL) BY MOUTH DAILY.    Dispense:  30 capsule    Refill:  11    Follow-up: No follow-ups on file.    Azzie Glatter, FNP

## 2021-02-17 ENCOUNTER — Other Ambulatory Visit: Payer: Self-pay

## 2021-02-17 LAB — CBC WITH DIFFERENTIAL/PLATELET
Basophils Absolute: 0 10*3/uL (ref 0.0–0.2)
Basos: 0 %
EOS (ABSOLUTE): 0.1 10*3/uL (ref 0.0–0.4)
Eos: 3 %
Hematocrit: 36.4 % (ref 34.0–46.6)
Hemoglobin: 12.4 g/dL (ref 11.1–15.9)
Immature Grans (Abs): 0 10*3/uL (ref 0.0–0.1)
Immature Granulocytes: 0 %
Lymphocytes Absolute: 1.9 10*3/uL (ref 0.7–3.1)
Lymphs: 35 %
MCH: 33.1 pg — ABNORMAL HIGH (ref 26.6–33.0)
MCHC: 34.1 g/dL (ref 31.5–35.7)
MCV: 97 fL (ref 79–97)
Monocytes Absolute: 0.7 10*3/uL (ref 0.1–0.9)
Monocytes: 13 %
Neutrophils Absolute: 2.8 10*3/uL (ref 1.4–7.0)
Neutrophils: 49 %
Platelets: 258 10*3/uL (ref 150–450)
RBC: 3.75 x10E6/uL — ABNORMAL LOW (ref 3.77–5.28)
RDW: 12.9 % (ref 11.7–15.4)
WBC: 5.6 10*3/uL (ref 3.4–10.8)

## 2021-02-17 LAB — LIPID PANEL
Chol/HDL Ratio: 2.3 ratio (ref 0.0–4.4)
Cholesterol, Total: 192 mg/dL (ref 100–199)
HDL: 82 mg/dL (ref >39)
LDL Chol Calc (NIH): 97 mg/dL (ref 0–99)
Triglycerides: 72 mg/dL (ref 0–149)
VLDL Cholesterol Cal: 13 mg/dL (ref 5–40)

## 2021-02-17 LAB — VITAMIN D 25 HYDROXY (VIT D DEFICIENCY, FRACTURES): Vit D, 25-Hydroxy: 33.3 ng/mL (ref 30.0–100.0)

## 2021-02-17 LAB — VITAMIN B12: Vitamin B-12: 499 pg/mL (ref 232–1245)

## 2021-02-17 LAB — TSH: TSH: 1.36 u[IU]/mL (ref 0.450–4.500)

## 2021-02-18 ENCOUNTER — Other Ambulatory Visit: Payer: Self-pay

## 2021-02-19 ENCOUNTER — Other Ambulatory Visit: Payer: Self-pay

## 2021-02-25 ENCOUNTER — Other Ambulatory Visit: Payer: Self-pay

## 2021-02-26 ENCOUNTER — Other Ambulatory Visit: Payer: Self-pay

## 2021-03-09 ENCOUNTER — Encounter: Payer: Medicare Other | Admitting: Family Medicine

## 2021-04-07 ENCOUNTER — Other Ambulatory Visit: Payer: Self-pay

## 2021-04-08 ENCOUNTER — Other Ambulatory Visit: Payer: Self-pay

## 2021-05-07 ENCOUNTER — Other Ambulatory Visit: Payer: Self-pay

## 2021-05-22 ENCOUNTER — Other Ambulatory Visit: Payer: Self-pay

## 2021-05-25 ENCOUNTER — Other Ambulatory Visit: Payer: Self-pay

## 2021-05-26 ENCOUNTER — Other Ambulatory Visit: Payer: Self-pay

## 2021-05-26 MED ORDER — VYZULTA 0.024 % OP SOLN
OPHTHALMIC | 3 refills | Status: DC
  Filled 2021-05-26: qty 5, 37d supply, fill #0

## 2021-05-27 ENCOUNTER — Other Ambulatory Visit: Payer: Self-pay

## 2021-06-03 ENCOUNTER — Other Ambulatory Visit: Payer: Self-pay

## 2021-06-04 ENCOUNTER — Other Ambulatory Visit: Payer: Self-pay

## 2021-06-08 ENCOUNTER — Other Ambulatory Visit: Payer: Self-pay

## 2021-06-16 ENCOUNTER — Encounter (INDEPENDENT_AMBULATORY_CARE_PROVIDER_SITE_OTHER): Payer: Self-pay

## 2021-06-16 ENCOUNTER — Telehealth (HOSPITAL_COMMUNITY): Payer: Self-pay | Admitting: Nurse Practitioner

## 2021-06-16 NOTE — Telephone Encounter (Signed)
Pt came in to drop off docs for PCP to view before her upcoming appt. Pt states it's time to have an endoscopy scheduled and brought past result along W/ note from nurse stating pt is due for shingrix shot.

## 2021-06-17 NOTE — Telephone Encounter (Signed)
Received

## 2021-07-02 ENCOUNTER — Other Ambulatory Visit: Payer: Self-pay

## 2021-07-02 ENCOUNTER — Encounter: Payer: Self-pay | Admitting: Nurse Practitioner

## 2021-07-02 ENCOUNTER — Ambulatory Visit (INDEPENDENT_AMBULATORY_CARE_PROVIDER_SITE_OTHER): Payer: Medicare Other | Admitting: Nurse Practitioner

## 2021-07-02 VITALS — BP 123/82 | HR 92 | Temp 97.3°F | Ht 61.5 in | Wt 213.2 lb

## 2021-07-02 DIAGNOSIS — Z1211 Encounter for screening for malignant neoplasm of colon: Secondary | ICD-10-CM

## 2021-07-02 DIAGNOSIS — M5441 Lumbago with sciatica, right side: Secondary | ICD-10-CM

## 2021-07-02 DIAGNOSIS — M5442 Lumbago with sciatica, left side: Secondary | ICD-10-CM | POA: Diagnosis not present

## 2021-07-02 DIAGNOSIS — M79672 Pain in left foot: Secondary | ICD-10-CM | POA: Diagnosis not present

## 2021-07-02 DIAGNOSIS — J302 Other seasonal allergic rhinitis: Secondary | ICD-10-CM

## 2021-07-02 DIAGNOSIS — M25531 Pain in right wrist: Secondary | ICD-10-CM

## 2021-07-02 DIAGNOSIS — G8929 Other chronic pain: Secondary | ICD-10-CM

## 2021-07-02 DIAGNOSIS — I10 Essential (primary) hypertension: Secondary | ICD-10-CM | POA: Diagnosis not present

## 2021-07-02 LAB — POCT URINALYSIS DIP (CLINITEK)
Bilirubin, UA: NEGATIVE
Blood, UA: NEGATIVE
Glucose, UA: NEGATIVE mg/dL
Ketones, POC UA: NEGATIVE mg/dL
Leukocytes, UA: NEGATIVE
Nitrite, UA: NEGATIVE
POC PROTEIN,UA: NEGATIVE
Spec Grav, UA: 1.025 (ref 1.010–1.025)
Urobilinogen, UA: 0.2 E.U./dL
pH, UA: 6 (ref 5.0–8.0)

## 2021-07-02 MED ORDER — VYZULTA 0.024 % OP SOLN
1.0000 [drp] | Freq: Every morning | OPHTHALMIC | 3 refills | Status: AC
  Filled 2021-07-02 – 2021-11-19 (×2): qty 5, 40d supply, fill #0
  Filled 2021-12-24: qty 5, 40d supply, fill #1
  Filled 2022-02-22: qty 5, 40d supply, fill #2

## 2021-07-02 MED ORDER — IBUPROFEN 800 MG PO TABS
800.0000 mg | ORAL_TABLET | Freq: Three times a day (TID) | ORAL | 6 refills | Status: DC | PRN
  Filled 2021-07-02: qty 30, 10d supply, fill #0

## 2021-07-02 MED ORDER — OLOPATADINE HCL 0.2 % OP SOLN
1.0000 [drp] | Freq: Every day | OPHTHALMIC | 3 refills | Status: DC
  Filled 2021-07-02: qty 2.5, 20d supply, fill #0

## 2021-07-02 NOTE — Patient Instructions (Signed)
Managing Your Hypertension Hypertension, also called high blood pressure, is when the force of the blood pressing against the walls of the arteries is too strong. Arteries are blood vessels that carry blood from your heart throughout your body. Hypertension forces the heart to work harder to pump blood and may cause the arteries tobecome narrow or stiff. Understanding blood pressure readings Your personal target blood pressure may vary depending on your medical conditions, your age, and other factors. A blood pressure reading includes a higher number over a lower number. Ideally, your blood pressure should be below 120/80. You should know that: The first, or top, number is called the systolic pressure. It is a measure of the pressure in your arteries as your heart beats. The second, or bottom number, is called the diastolic pressure. It is a measure of the pressure in your arteries as the heart relaxes. Blood pressure is classified into four stages. Based on your blood pressure reading, your health care provider may use the following stages to determine what type of treatment you need, if any. Systolic pressure and diastolicpressure are measured in a unit called mmHg. Normal Systolic pressure: below 120. Diastolic pressure: below 80. Elevated Systolic pressure: 120-129. Diastolic pressure: below 80. Hypertension stage 1 Systolic pressure: 130-139. Diastolic pressure: 80-89. Hypertension stage 2 Systolic pressure: 140 or above. Diastolic pressure: 90 or above. How can this condition affect me? Managing your hypertension is an important responsibility. Over time, hypertension can damage the arteries and decrease blood flow to important parts of the body, including the brain, heart, and kidneys. Having untreated or uncontrolled hypertension can lead to: A heart attack. A stroke. A weakened blood vessel (aneurysm). Heart failure. Kidney damage. Eye damage. Metabolic syndrome. Memory and  concentration problems. Vascular dementia. What actions can I take to manage this condition? Hypertension can be managed by making lifestyle changes and possibly by taking medicines. Your health care provider will help you make a plan to bring yourblood pressure within a normal range. Nutrition  Eat a diet that is high in fiber and potassium, and low in salt (sodium), added sugar, and fat. An example eating plan is called the Dietary Approaches to Stop Hypertension (DASH) diet. To eat this way: Eat plenty of fresh fruits and vegetables. Try to fill one-half of your plate at each meal with fruits and vegetables. Eat whole grains, such as whole-wheat pasta, brown rice, or whole-grain bread. Fill about one-fourth of your plate with whole grains. Eat low-fat dairy products. Avoid fatty cuts of meat, processed or cured meats, and poultry with skin. Fill about one-fourth of your plate with lean proteins such as fish, chicken without skin, beans, eggs, and tofu. Avoid pre-made and processed foods. These tend to be higher in sodium, added sugar, and fat. Reduce your daily sodium intake. Most people with hypertension should eat less than 1,500 mg of sodium a day.  Lifestyle  Work with your health care provider to maintain a healthy body weight or to lose weight. Ask what an ideal weight is for you. Get at least 30 minutes of exercise that causes your heart to beat faster (aerobic exercise) most days of the week. Activities may include walking, swimming, or biking. Include exercise to strengthen your muscles (resistance exercise), such as weight lifting, as part of your weekly exercise routine. Try to do these types of exercises for 30 minutes at least 3 days a week. Do not use any products that contain nicotine or tobacco, such as cigarettes, e-cigarettes, and chewing   tobacco. If you need help quitting, ask your health care provider. Control any long-term (chronic) conditions you have, such as high  cholesterol or diabetes. Identify your sources of stress and find ways to manage stress. This may include meditation, deep breathing, or making time for fun activities.  Alcohol use Do not drink alcohol if: Your health care provider tells you not to drink. You are pregnant, may be pregnant, or are planning to become pregnant. If you drink alcohol: Limit how much you use to: 0-1 drink a day for women. 0-2 drinks a day for men. Be aware of how much alcohol is in your drink. In the [place] one drink equals one 12 oz bottle of beer (355 mL), one 5 oz glass of wine (148 mL), or one 1 oz glass of hard liquor (44 mL). Medicines Your health care provider may prescribe medicine if lifestyle changes are not enough to get your blood pressure under control and if: Your systolic blood pressure is 130 or higher. Your diastolic blood pressure is 80 or higher. Take medicines only as told by your health care provider. Follow the directions carefully. Blood pressure medicines must be taken as told by your health care provider. The medicine does not work as well when you skip doses. Skippingdoses also puts you at risk for problems. Monitoring Before you monitor your blood pressure: Do not smoke, drink caffeinated beverages, or exercise within 30 minutes before taking a measurement. Use the bathroom and empty your bladder (urinate). Sit quietly for at least 5 minutes before taking measurements. Monitor your blood pressure at home as told by your health care provider. To do this: Sit with your back straight and supported. Place your feet flat on the floor. Do not cross your legs. Support your arm on a flat surface, such as a table. Make sure your upper arm is at heart level. Each time you measure, take two or three readings one minute apart and record the results. You may also need to have your blood pressure checked regularly by your healthcare provider. General information Talk with your health care  provider about your diet, exercise habits, and other lifestyle factors that may be contributing to hypertension. Review all the medicines you take with your health care provider because there may be side effects or interactions. Keep all visits as told by your health care provider. Your health care provider can help you create and adjust your plan for managing your high blood pressure. Where to find more information National Heart, Lung, and Blood Institute: www.nhlbi.nih.gov American Heart Association: www.heart.org Contact a health care provider if: You think you are having a reaction to medicines you have taken. You have repeated (recurrent) headaches. You feel dizzy. You have swelling in your ankles. You have trouble with your vision. Get help right away if: You develop a severe headache or confusion. You have unusual weakness or numbness, or you feel faint. You have severe pain in your chest or abdomen. You vomit repeatedly. You have trouble breathing. These symptoms may represent a serious problem that is an emergency. Do not wait to see if the symptoms will go away. Get medical help right away. Call your local emergency services (911 in the [place] Do not drive yourself to the hospital. Summary Hypertension is when the force of blood pumping through your arteries is too strong. If this condition is not controlled, it may put you at risk for serious complications. Your personal target blood pressure may vary depending on your medical conditions,   your age, and other factors. For most people, a normal blood pressure is less than 120/80. Hypertension is managed by lifestyle changes, medicines, or both. Lifestyle changes to help manage hypertension include losing weight, eating a healthy, low-sodium diet, exercising more, stopping smoking, and limiting alcohol. This information is not intended to replace advice given to you by your health care provider. Make sure you discuss any questions  you have with your healthcare provider. Document Revised: 11/30/2019 Document Reviewed: 09/25/2019 Elsevier Patient Education  2022 Elsevier Inc.  

## 2021-07-02 NOTE — Progress Notes (Signed)
[facility] [place], [place]  [address] Phone:  [phone]   Fax:  [phone]   Established Patient Office Visit  Subjective:  Patient ID: Deanna Floyd, female    DOB: [birth year]  Age: 60 y.o. MRN: [id number]  CC:  Chief Complaint  Patient presents with   Follow-up    Pt is here today for her follow up visit. Pt states she has been having lower back pain x 1 month.    HPI Deanna Floyd presents for follow up. A former patient of NP [place].  She  has a past medical history of GERD (gastroesophageal reflux disease), Hypertension, and Stroke ([place]) (2007).  Back Pain Patient presents for evaluation of low back problems. Symptoms have been present for 1 month and include pain in back (aching in character. Initial inciting event: none. Symptoms are worse  varies . Alleviating factors identifiable by the patient are none. Aggravating factors identifiable by the patient varies . Treatments initiated by the patient:  IBM and muscle relaxer  She is out of her IBM. Previous lower back problems: in 2018. Previous work up:  [employer] . Previous treatments: IBM and muscle relaxer. She has inflammation in her wrist and ankles.   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 She reports the sun caused damage to her eyes.She reports of increased pressure and blood behind her eyes. She needs refills on her eye drops.   Past Medical History:  Diagnosis Date   GERD (gastroesophageal reflux disease)    Hypertension    Stroke ([place]) 2007    Past Surgical History:  Procedure Laterality Date   FOOT SURGERY  2015   left side had 2 surgeries in 2015   [place]  2010    Family History  Problem Relation Age of Onset   Hypertension Sister    Colon cancer Neg Hx    Stomach cancer Neg Hx     Social History   Socioeconomic History   Marital status: Single    Spouse name: Not on file   Number of children: Not on file   Years  of education: Not on file   Highest education level: Not on file  Occupational History   Not on file  Tobacco Use   Smoking status: Former   Smokeless tobacco: Never  Vaping Use   Vaping Use: Never used  Substance and Sexual Activity   Alcohol use: Yes    Comment: occ wine    Drug use: No   Sexual activity: Not Currently  Other Topics Concern   Not on file  Social History Narrative   Not on file   Social Determinants of Health   Financial Resource Strain: Not on file  Food Insecurity: Not on file  Transportation Needs: Not on file  Physical Activity: Not on file  Stress: Not on file  Social Connections: Not on file  Intimate Partner Violence: Not on file    Outpatient Medications Prior to Visit  Medication Sig Dispense Refill   amLODipine (NORVASC) 5 MG tablet TAKE 1 TABLET (5 MG TOTAL) BY MOUTH DAILY. 90 tablet 3   cetirizine (ZYRTEC) 10 MG tablet Take 1 tablet (10 mg total) by mouth daily. 30 tablet 11   cyclobenzaprine (FLEXERIL) 10 MG tablet Take 1 tablet (10 mg total) by mouth 3 (three) times daily as needed for muscle spasms. 30 tablet 6   diclofenac (VOLTAREN) 75 MG EC tablet TAKE 1  TABLET (75 MG TOTAL) BY MOUTH 2 (TWO) TIMES DAILY AS NEEDED. 60 tablet 6   diclofenac Sodium (VOLTAREN) 1 % GEL Apply 4 g topically 4 (four) times daily. 100 g 6   diphenhydrAMINE (BENADRYL) 2 % cream Apply topically 3 (three) times daily as needed for itching. 30 g 6   gabapentin (NEURONTIN) 300 MG capsule Take 1 capsule (300 mg total) by mouth 2 (two) times daily. As needed. 180 capsule 3   lisinopril-hydrochlorothiazide (ZESTORETIC) 20-12.5 MG tablet TAKE 1 TABLET BY MOUTH DAILY. 90 tablet 3   Multiple Vitamin (MULTIVITAMIN WITH MINERALS) TABS tablet Take 1 tablet by mouth daily.     omeprazole (PRILOSEC) 40 MG capsule TAKE 1 CAPSULE (40 MG TOTAL) BY MOUTH DAILY. 30 capsule 11   Latanoprostene Bunod (VYZULTA) 0.024 % SOLN place 1 drop into  Both Eyes every evening 5 mL 3   Olopatadine  HCl 0.2 % SOLN One drop into each eye daily, as needed. 2.5 mL 6   acetaminophen (TYLENOL) 500 MG tablet Take 1 tablet (500 mg total) by mouth every 6 (six) hours as needed. (Patient not taking: Reported on 07/02/2021) 30 tablet 6   No facility-administered medications prior to visit.    Allergies  Allergen Reactions   Hydrocortisone Hives   Lyrica [Pregabalin] Rash    ROS Review of Systems    Objective:    Physical Exam Constitutional:      Appearance: She is obese.  HENT:     Head: Normocephalic and atraumatic.  Cardiovascular:     Rate and Rhythm: Normal rate and regular rhythm.     Pulses: Normal pulses.  Pulmonary:     Effort: Pulmonary effort is normal.     Breath sounds: Normal breath sounds.  Abdominal:     General: Bowel sounds are normal.     Palpations: Abdomen is soft.  Musculoskeletal:        General: Normal range of motion.     Cervical back: Normal range of motion.  Skin:    General: Skin is warm.     Capillary Refill: Capillary refill takes less than 2 seconds.  Neurological:     General: No focal deficit present.     Mental Status: She is alert and oriented to person, place, and time.    BP 123/82 (BP Location: Left Arm, Patient Position: Sitting, Cuff Size: Normal)   Pulse 92   Temp (!) 97.3 F (36.3 C)   Ht 5' 1.5" (1.562 m)   Wt 213 lb 3.2 oz (96.7 kg)   SpO2 98%   BMI 39.63 kg/m  Wt Readings from Last 3 Encounters:  07/02/21 213 lb 3.2 oz (96.7 kg)  02/16/21 222 lb (100.7 kg)  01/05/21 220 lb (99.8 kg)     There are no preventive care reminders to display for this patient.   There are no preventive care reminders to display for this patient.  Lab Results  Component Value Date   TSH 1.360 02/16/2021   Lab Results  Component Value Date   WBC 5.6 02/16/2021   HGB 12.4 02/16/2021   HCT 36.4 02/16/2021   MCV 97 02/16/2021   PLT 258 02/16/2021   Lab Results  Component Value Date   NA 138 03/12/2020   K 4.0 03/12/2020   CO2  22 03/12/2020   GLUCOSE 99 03/12/2020   BUN 12 03/12/2020   CREATININE 0.75 03/12/2020   BILITOT 0.3 03/12/2020   ALKPHOS 66 03/12/2020   AST 16 03/12/2020  ALT 16 03/12/2020   PROT 7.4 03/12/2020   ALBUMIN 4.4 03/12/2020   CALCIUM 9.7 03/12/2020   ANIONGAP 6 06/04/2015   Lab Results  Component Value Date   CHOL 192 02/16/2021   Lab Results  Component Value Date   HDL 82 02/16/2021   Lab Results  Component Value Date   LDLCALC 97 02/16/2021   Lab Results  Component Value Date   TRIG 72 02/16/2021   Lab Results  Component Value Date   CHOLHDL 2.3 02/16/2021   Lab Results  Component Value Date   HGBA1C 5.4 03/12/2020      Assessment & Plan:   Problem List Items Addressed This Visit       Cardiovascular and Mediastinum   Essential hypertension - Primary Stable Encouraged on going compliance with current medication regimen Encouraged home monitoring and recording BP <130/80 Eating a heart-healthy diet with less salt Encouraged regular physical activity  Recommend Weight loss     Relevant Orders   POCT URINALYSIS DIP (CLINITEK) (Completed)     Other   Low back pain Persistent Refill ibuprofen continue with muscle relaxant cyclobenzaprine If ineffective will further evaluate   Relevant Medications   ibuprofen (ADVIL) 800 MG tablet   Chronic foot pain, left Persistent encourage patient to continue with gabapentin   Relevant Medications   ibuprofen (ADVIL) 800 MG tablet   Seasonal allergies   Relevant Medications   Olopatadine HCl 0.2 % SOLN   Other Visit Diagnoses     Chronic pain of right wrist       Relevant Medications   ibuprofen (ADVIL) 800 MG tablet   Screening for colon cancer       Relevant Orders   Ambulatory referral to Gastroenterology       Meds ordered this encounter  Medications   ibuprofen (ADVIL) 800 MG tablet    Sig: Take 1 tablet (800 mg total) by mouth every 8 (eight) hours as needed.    Dispense:  30 tablet     Refill:  6    Order Specific Question:   Supervising Provider    Answer:   Tresa Garter [id number]   Latanoprostene Bunod (VYZULTA) 0.024 % SOLN    Sig: Place 1 drop into both eyes every morning.    Dispense:  5 mL    Refill:  3    USE Rx DISCOUNT CARD: $448.23,  [id number],  [id number],  Group:[id number],  [id number]    Order Specific Question:   Supervising Provider    Answer:   Tresa Garter [id number]   Olopatadine HCl 0.2 % SOLN    Sig: Place 1 drop into both eyes daily. One drop into each eye daily, as needed.    Dispense:  4.5 mL    Refill:  3    Order Specific Question:   Supervising Provider    Answer:   Tresa Garter [id number]     Follow-up: No follow-ups on file.    Vevelyn Francois, NP

## 2021-07-03 ENCOUNTER — Other Ambulatory Visit: Payer: Self-pay

## 2021-07-06 ENCOUNTER — Other Ambulatory Visit: Payer: Self-pay

## 2021-07-27 ENCOUNTER — Telehealth: Payer: Self-pay

## 2021-07-27 NOTE — Telephone Encounter (Signed)
Attempted to reach patient- no answer or voice mail to leave a message.

## 2021-08-05 ENCOUNTER — Ambulatory Visit: Payer: Medicare Other

## 2021-08-05 DIAGNOSIS — Z0000 Encounter for general adult medical examination without abnormal findings: Secondary | ICD-10-CM

## 2021-08-06 ENCOUNTER — Ambulatory Visit (INDEPENDENT_AMBULATORY_CARE_PROVIDER_SITE_OTHER): Payer: Medicare Other

## 2021-08-06 DIAGNOSIS — Z0000 Encounter for general adult medical examination without abnormal findings: Secondary | ICD-10-CM

## 2021-08-06 NOTE — Progress Notes (Incomplete)
Subjective:   Deanna Floyd is a 60 y.o. female who presents for an Initial Medicare Annual Wellness Visit.  I connected with  Deanna Floyd on 08/06/21 by audio enabled telemedicine application and verified that I am speaking with the correct person using two identifiers.   I discussed the limitations of evaluation and management by telemedicine. The patient expressed understanding and agreed to proceed.   Location of Patient: Home Location of Provider: Office   List any persons and their roles That are participating in the visit with the patient. Lavell Luster, LPN    [name] , Thank you for taking time to come for your Medicare Wellness Visit. I appreciate your ongoing commitment to your health goals. Please review the following plan we discussed and let me know if I can assist you in the future.   These are the goals we discussed:  Goals   None     This is a list of the screening recommended for you and due dates:  Health Maintenance  Topic Date Due   Pap Smear  08/11/2019   COVID-19 Vaccine (4 - Booster for Pfizer series) 08/08/2021   Zoster (Shingles) Vaccine (1 of 2) 10/02/2021*   Flu Shot  02/05/2022*   Tetanus Vaccine  07/05/2027*   Colon Cancer Screening  12/05/2022   Mammogram  12/31/2022   Hepatitis C Screening: USPSTF Recommendation to screen - Ages 18-79 yo.  Completed   HIV Screening  Completed   HPV Vaccine  Aged Out  *Topic was postponed. The date shown is not the original due date.     Review of Systems    Defer to PCP       Objective:    There were no vitals filed for this visit. There is no height or weight on file to calculate BMI.  Advanced Directives 07/15/2020 08/10/2016 06/07/2016 10/06/2015 07/31/2015 06/04/2015  Does Patient Have a Medical Advance Directive? No No No No No No  Would patient like information on creating a medical advance directive? - No - patient declined information No - patient declined information No - patient  declined information No - patient declined information No - patient declined information    Current Medications (verified) Outpatient Encounter Medications as of 08/06/2021  Medication Sig   acetaminophen (TYLENOL) 500 MG tablet Take 1 tablet (500 mg total) by mouth every 6 (six) hours as needed. (Patient not taking: Reported on 07/02/2021)   amLODipine (NORVASC) 5 MG tablet TAKE 1 TABLET (5 MG TOTAL) BY MOUTH DAILY.   cetirizine (ZYRTEC) 10 MG tablet Take 1 tablet (10 mg total) by mouth daily.   cyclobenzaprine (FLEXERIL) 10 MG tablet Take 1 tablet (10 mg total) by mouth 3 (three) times daily as needed for muscle spasms.   diclofenac (VOLTAREN) 75 MG EC tablet TAKE 1 TABLET (75 MG TOTAL) BY MOUTH 2 (TWO) TIMES DAILY AS NEEDED.   diclofenac Sodium (VOLTAREN) 1 % GEL Apply 4 g topically 4 (four) times daily.   diphenhydrAMINE (BENADRYL) 2 % cream Apply topically 3 (three) times daily as needed for itching.   gabapentin (NEURONTIN) 300 MG capsule Take 1 capsule (300 mg total) by mouth 2 (two) times daily. As needed.   ibuprofen (ADVIL) 800 MG tablet Take 1 tablet (800 mg total) by mouth every 8 (eight) hours as needed.   Latanoprostene Bunod (VYZULTA) 0.024 % SOLN Place 1 drop into both eyes every morning.   lisinopril-hydrochlorothiazide (ZESTORETIC) 20-12.5 MG tablet TAKE 1 TABLET BY MOUTH DAILY.  Multiple Vitamin (MULTIVITAMIN WITH MINERALS) TABS tablet Take 1 tablet by mouth daily.   Olopatadine HCl 0.2 % SOLN Place 1 drop into both eyes daily. One drop into each eye daily, as needed.   omeprazole (PRILOSEC) 40 MG capsule TAKE 1 CAPSULE (40 MG TOTAL) BY MOUTH DAILY.   No facility-administered encounter medications on file as of 08/06/2021.    Allergies (verified) Hydrocortisone and Lyrica [pregabalin]   History: Past Medical History:  Diagnosis Date   GERD (gastroesophageal reflux disease)    Hypertension    Stroke ([place]) 2007   Past Surgical History:  Procedure Laterality Date    FOOT SURGERY  2015   left side had 2 surgeries in 2015   [place]  2010   Family History  Problem Relation Age of Onset   Hypertension Sister    Colon cancer Neg Hx    Stomach cancer Neg Hx    Social History   Socioeconomic History   Marital status: Single    Spouse name: Not on file   Number of children: Not on file   Years of education: Not on file   Highest education level: Not on file  Occupational History   Not on file  Tobacco Use   Smoking status: Former   Smokeless tobacco: Never  Vaping Use   Vaping Use: Never used  Substance and Sexual Activity   Alcohol use: Yes    Comment: occ wine    Drug use: No   Sexual activity: Not Currently  Other Topics Concern   Not on file  Social History Narrative   Not on file   Social Determinants of Health   Financial Resource Strain: Not on file  Food Insecurity: Not on file  Transportation Needs: Not on file  Physical Activity: Not on file  Stress: Not on file  Social Connections: Not on file    Tobacco Counseling Counseling given: Not Answered   Clinical Intake:                 Diabetic? No         Activities of Daily Living No flowsheet data found.  Patient Care Team: Vevelyn Francois, NP as PCP - General (Adult Health Nurse Practitioner)  Indicate any recent Medical Services you may have received from other than [facility] providers in the past year (date may be approximate).     Assessment:   This is a routine wellness examination for Deanna Floyd.  Hearing/Vision screen No results found.  Dietary issues and exercise activities discussed:     Goals Addressed   None   Depression Screen PHQ 2/9 Scores 07/02/2021 07/04/2020 05/08/2019 02/05/2019 11/06/2018 08/01/2018 02/01/2018  PHQ - 2 Score 0 0 0 0 0 0 2  PHQ- 9 Score - - - - - - 6  Exception Documentation - Medical reason - - - - -    Fall Risk Fall Risk  07/02/2021 07/04/2020 03/26/2020 03/12/2020 08/21/2019  Falls in the past  year? 1 1 - 1 0  Number falls in past yr: 1 0 0 0 0  Injury with Fall? 1 0 0 1 0  Risk for fall due to : - No Fall Risks - - -  Risk for fall due to: Comment - - - - -    [place]:  Any stairs in or around the home? Yes  If so, are there any without handrails? No  Home free of loose throw rugs in walkways,  pet beds, electrical cords, etc? Yes  Adequate lighting in your home to reduce risk of falls? Yes   ASSISTIVE DEVICES UTILIZED TO PREVENT FALLS:  Life alert? No  Use of a cane, walker or w/c? No  Grab bars in the bathroom? No  Shower chair or bench in shower? No  Elevated toilet seat or a handicapped toilet? No   TIMED UP AND GO:  Was the test performed?  N/A .  Length of time to ambulate 10 feet: N/A sec.     Cognitive Function:        Immunizations Immunization History  Administered Date(s) Administered   Influenza,inj,Quad PF,6+ Mos 10/06/2015, 08/06/2016, 07/04/2017, 08/01/2018, 08/21/2019   PFIZER(Purple Top)SARS-COV-2 Vaccination 04/12/2020, 05/03/2020, 04/08/2021    TDAP status: Up to date  Flu Vaccine status: Due, Education has been provided regarding the importance of this vaccine. Advised may receive this vaccine at local pharmacy or Health Dept. Aware to provide a copy of the vaccination record if obtained from local pharmacy or Health Dept. Verbalized acceptance and understanding.  Pneumococcal vaccine status: Due, Education has been provided regarding the importance of this vaccine. Advised may receive this vaccine at local pharmacy or Health Dept. Aware to provide a copy of the vaccination record if obtained from local pharmacy or Health Dept. Verbalized acceptance and understanding.  Covid-19 vaccine status: Information provided on how to obtain vaccines.   Qualifies for Shingles Vaccine? Yes   Zostavax completed No   Shingrix Completed?: No.    Education has been provided regarding the importance of this vaccine.  Patient has been advised to call insurance company to determine out of pocket expense if they have not yet received this vaccine. Advised may also receive vaccine at local pharmacy or Health Dept. Verbalized acceptance and understanding.  Screening Tests Health Maintenance  Topic Date Due   PAP SMEAR-Modifier  08/11/2019   COVID-19 Vaccine (4 - Booster for Pfizer series) 08/08/2021   Zoster Vaccines- Shingrix (1 of 2) 10/02/2021 (Originally 01/28/2010)   INFLUENZA VACCINE  02/05/2022 (Originally 06/08/2021)   TETANUS/TDAP  07/05/2027 (Originally 01/29/1979)   COLONOSCOPY (Pts 45-66yrs Insurance coverage will need to be confirmed)  12/05/2022   MAMMOGRAM  12/31/2022   Hepatitis C Screening  Completed   HIV Screening  Completed   HPV VACCINES  Aged Out    Health Maintenance  Health Maintenance Due  Topic Date Due   PAP SMEAR-Modifier  08/11/2019   COVID-19 Vaccine (4 - Booster for [place] series) 08/08/2021    Colorectal cancer screening: Type of screening: Colonoscopy. Completed 12/05/2017. Repeat every 5 years  Mammogram status: Completed 12/31/2020. Repeat every year    Lung Cancer Screening: (Low Dose CT Chest recommended if Age 8-80 years, 30 pack-year currently smoking OR have quit w/in 15years.) does qualify.   Lung Cancer Screening Referral: No  Additional Screening:  Hepatitis C Screening: does qualify; Completed 10/06/2015  Vision Screening: Recommended annual ophthalmology exams for early detection of glaucoma and other disorders of the eye. Is the patient up to date with their annual eye exam?  Yes  Who is the provider or what is the name of the office in which the patient attends annual eye exams? [facility] If pt is not established with a provider, would they like to be referred to a provider to establish care? No .   Dental Screening: Recommended annual dental exams for proper oral hygiene  Community Resource Referral / Chronic Care Management: CRR  required this visit?  No  CCM required this visit?  No      Plan:     I have personally reviewed and noted the following in the patients chart:   Medical and social history Use of alcohol, tobacco or illicit drugs  Current medications and supplements including opioid prescriptions. Patient is not currently taking opioid prescriptions. Functional ability and status Nutritional status Physical activity Advanced directives List of other physicians Hospitalizations, surgeries, and ER visits in previous 12 months Vitals Screenings to include cognitive, depression, and falls Referrals and appointments  In addition, I have reviewed and discussed with patient certain preventive protocols, quality metrics, and best practice recommendations. A written personalized care plan for preventive services as well as general preventive health recommendations were provided to patient.     Lavell Luster, LPN   11/10/7251   Nurse Notes: Non Face to Face 45 min visit.   Ms. Pinder , Thank you for taking time to come for your Medicare Wellness Visit. I appreciate your ongoing commitment to your health goals. Please review the following plan we discussed and let me know if I can assist you in the future.   These are the goals we discussed:  Goals   None     This is a list of the screening recommended for you and due dates:  Health Maintenance  Topic Date Due   Pap Smear  08/11/2019   COVID-19 Vaccine (4 - Booster for Pfizer series) 08/08/2021   Zoster (Shingles) Vaccine (1 of 2) 10/02/2021*   Flu Shot  02/05/2022*   Tetanus Vaccine  07/05/2027*   Colon Cancer Screening  12/05/2022   Mammogram  12/31/2022   Hepatitis C Screening: USPSTF Recommendation to screen - Ages 18-79 yo.  Completed   HIV Screening  Completed   HPV Vaccine  Aged Out  *Topic was postponed. The date shown is not the original due date.

## 2021-08-19 ENCOUNTER — Ambulatory Visit (HOSPITAL_COMMUNITY)
Admission: RE | Admit: 2021-08-19 | Discharge: 2021-08-19 | Disposition: A | Discharge status: Home / Self Care | Payer: Medicare Other | Source: Ambulatory Visit | Attending: Nurse Practitioner | Admitting: Nurse Practitioner

## 2021-08-19 ENCOUNTER — Other Ambulatory Visit: Payer: Self-pay

## 2021-08-19 ENCOUNTER — Ambulatory Visit (INDEPENDENT_AMBULATORY_CARE_PROVIDER_SITE_OTHER): Payer: Medicare Other | Admitting: Nurse Practitioner

## 2021-08-19 ENCOUNTER — Encounter: Payer: Self-pay | Admitting: Nurse Practitioner

## 2021-08-19 VITALS — BP 146/92 | HR 79 | Temp 97.0°F | Resp 20 | Ht 61.5 in | Wt 214.0 lb

## 2021-08-19 DIAGNOSIS — E8881 Metabolic syndrome: Secondary | ICD-10-CM

## 2021-08-19 DIAGNOSIS — G8929 Other chronic pain: Secondary | ICD-10-CM | POA: Insufficient documentation

## 2021-08-19 DIAGNOSIS — M25511 Pain in right shoulder: Secondary | ICD-10-CM | POA: Insufficient documentation

## 2021-08-19 DIAGNOSIS — M5442 Lumbago with sciatica, left side: Secondary | ICD-10-CM

## 2021-08-19 DIAGNOSIS — R10811 Right upper quadrant abdominal tenderness: Secondary | ICD-10-CM

## 2021-08-19 DIAGNOSIS — R0789 Other chest pain: Secondary | ICD-10-CM

## 2021-08-19 DIAGNOSIS — M25531 Pain in right wrist: Secondary | ICD-10-CM | POA: Diagnosis not present

## 2021-08-19 DIAGNOSIS — R1011 Right upper quadrant pain: Secondary | ICD-10-CM | POA: Diagnosis not present

## 2021-08-19 DIAGNOSIS — K219 Gastro-esophageal reflux disease without esophagitis: Secondary | ICD-10-CM | POA: Diagnosis not present

## 2021-08-19 DIAGNOSIS — M79672 Pain in left foot: Secondary | ICD-10-CM | POA: Diagnosis not present

## 2021-08-19 DIAGNOSIS — Z760 Encounter for issue of repeat prescription: Secondary | ICD-10-CM

## 2021-08-19 DIAGNOSIS — R079 Chest pain, unspecified: Secondary | ICD-10-CM | POA: Diagnosis not present

## 2021-08-19 DIAGNOSIS — M5441 Lumbago with sciatica, right side: Secondary | ICD-10-CM | POA: Diagnosis not present

## 2021-08-19 DIAGNOSIS — I10 Essential (primary) hypertension: Secondary | ICD-10-CM

## 2021-08-19 LAB — POCT URINALYSIS DIP (CLINITEK)
Bilirubin, UA: NEGATIVE
Blood, UA: NEGATIVE
Glucose, UA: NEGATIVE mg/dL
Ketones, POC UA: NEGATIVE mg/dL
Leukocytes, UA: NEGATIVE
Nitrite, UA: NEGATIVE
POC PROTEIN,UA: NEGATIVE
Spec Grav, UA: 1.01 (ref 1.010–1.025)
Urobilinogen, UA: 0.2 E.U./dL
pH, UA: 5.5 (ref 5.0–8.0)

## 2021-08-19 MED ORDER — IBUPROFEN 800 MG PO TABS
800.0000 mg | ORAL_TABLET | Freq: Three times a day (TID) | ORAL | 6 refills | Status: DC | PRN
  Filled 2021-08-19 – 2021-11-19 (×2): qty 30, 10d supply, fill #0

## 2021-08-19 MED ORDER — LISINOPRIL-HYDROCHLOROTHIAZIDE 20-12.5 MG PO TABS
1.0000 | ORAL_TABLET | Freq: Every day | ORAL | 3 refills | Status: DC
  Filled 2021-08-19 – 2021-11-19 (×2): qty 90, 90d supply, fill #0
  Filled 2022-02-22: qty 90, 90d supply, fill #1
  Filled 2022-05-21: qty 90, 90d supply, fill #2

## 2021-08-19 MED ORDER — GABAPENTIN 300 MG PO CAPS
300.0000 mg | ORAL_CAPSULE | Freq: Two times a day (BID) | ORAL | 3 refills | Status: DC
  Filled 2021-08-19: qty 180, 90d supply, fill #0

## 2021-08-19 MED ORDER — OMEPRAZOLE 40 MG PO CPDR
40.0000 mg | DELAYED_RELEASE_CAPSULE | Freq: Every day | ORAL | 3 refills | Status: DC
Start: 2021-08-19 — End: 2022-08-24
  Filled 2021-08-19 – 2021-11-19 (×2): qty 90, 90d supply, fill #0
  Filled 2022-02-22: qty 90, 90d supply, fill #1
  Filled 2022-05-20: qty 90, 90d supply, fill #2

## 2021-08-19 MED ORDER — AMLODIPINE BESYLATE 5 MG PO TABS
ORAL_TABLET | Freq: Every day | ORAL | 3 refills | Status: DC
  Filled 2021-08-19 – 2021-11-19 (×2): qty 90, 90d supply, fill #0
  Filled 2022-02-22: qty 90, 90d supply, fill #1
  Filled 2022-05-20: qty 90, 90d supply, fill #2

## 2021-08-19 MED ORDER — DICLOFENAC SODIUM 1 % EX GEL
4.0000 g | Freq: Four times a day (QID) | CUTANEOUS | 6 refills | Status: DC
Start: 2021-08-19 — End: 2021-12-21
  Filled 2021-08-19: qty 100, 6d supply, fill #0

## 2021-08-19 MED ORDER — CYCLOBENZAPRINE HCL 10 MG PO TABS
10.0000 mg | ORAL_TABLET | Freq: Three times a day (TID) | ORAL | 6 refills | Status: DC | PRN
  Filled 2021-08-19: qty 30, 10d supply, fill #0

## 2021-08-19 NOTE — Progress Notes (Signed)
[address] [place], [place]  [address] Phone:  [phone]   Fax:  [phone]   Established Patient Office Visit  Subjective:  Patient ID: Deanna Floyd, female    DOB: 14-May-1961  Age: 60 y.o. MRN: [id number]  CC:  Chief Complaint  Patient presents with   Chest Pain    HPI Deanna Floyd presents for chest pain. She  has a past medical history of GERD (gastroesophageal reflux disease), Hypertension, and Stroke ([place]) (2007).   Chest Pain Elgene Rolf complains of chest pain. Onset was 1 day ago. Symptoms have been stable since that time. The patient's pain is intermittent and is associated with activities. The patient describes the pain as sharp and  right upper chest . Patient rates pain as a 4/10 in intensity. Associated symptoms are: chest pressure/discomfort. Aggravating factors are: deep inspiration. Alleviating factors are: none. Patient's cardiac risk factors are: hypertension and obesity (BMI >= 30 kg/m2). Patient's risk factors for DVT/PE: none. Previous cardiac testing: none. She cooked dinner. She went up stairs. She denies any eating dinner.  She feels like hard gas. She has taken [employer] and ginger tea. She was able to sleep. The pain resumed when she woke up. She denies any previous injury or any cause. She reports this same pain one month ago with no known cause.   Past Medical History:  Diagnosis Date   GERD (gastroesophageal reflux disease)    Hypertension    Stroke ([place]) 2007    Past Surgical History:  Procedure Laterality Date   FOOT SURGERY  2015   left side had 2 surgeries in 2015   [place]  2010    Family History  Problem Relation Age of Onset   Hypertension Sister    Colon cancer Neg Hx    Stomach cancer Neg Hx     Social History   Socioeconomic History   Marital status: Single    Spouse name: Not on file   Number of children: Not on file   Years of education: Not on file   Highest  education level: Not on file  Occupational History   Not on file  Tobacco Use   Smoking status: Former    Packs/day: 0.25    Years: 10.00    Pack years: 2.50    Types: Cigarettes    Quit date: 2012    Years since quitting: 10.7   Smokeless tobacco: Never  Vaping Use   Vaping Use: Never used  Substance and Sexual Activity   Alcohol use: Yes    Comment: occ wine    Drug use: No   Sexual activity: Not Currently  Other Topics Concern   Not on file  Social History Narrative   Not on file   Social Determinants of Health   Financial Resource Strain: Low Risk    Difficulty of Paying Living Expenses: Not very hard  Food Insecurity: No Food Insecurity   Worried About Charity fundraiser in the Last Year: Never true   Ran Out of Food in the Last Year: Never true  Transportation Needs: No Transportation Needs   Lack of Transportation (Medical): No   Lack of Transportation (Non-Medical): No  Physical Activity: Insufficiently Active   Days of Exercise per Week: 1 day   Minutes of Exercise per Session: 20 min  Stress: No Stress Concern Present   Feeling of Stress : Only a little  Social Connections: Moderately Integrated   Frequency of  Communication with Friends and Family: More than three times a week   Frequency of Social Gatherings with Friends and Family: More than three times a week   Attends Religious Services: More than 4 times per year   Active Member of [employer] or Organizations: Yes   Attends Music therapist: More than 4 times per year   Marital Status: Never married  Human resources officer Violence: Not At Risk   Fear of Current or Ex-Partner: No   Emotionally Abused: No   Physically Abused: No   Sexually Abused: No    Outpatient Medications Prior to Visit  Medication Sig Dispense Refill   acetaminophen (TYLENOL) 500 MG tablet Take 1 tablet (500 mg total) by mouth every 6 (six) hours as needed. 30 tablet 6   cetirizine (ZYRTEC) 10 MG tablet Take 1 tablet (10  mg total) by mouth daily. 30 tablet 11   diclofenac (VOLTAREN) 75 MG EC tablet TAKE 1 TABLET (75 MG TOTAL) BY MOUTH 2 (TWO) TIMES DAILY AS NEEDED. 60 tablet 6   diphenhydrAMINE (BENADRYL) 2 % cream Apply topically 3 (three) times daily as needed for itching. (Patient not taking: Reported on 08/06/2021) 30 g 6   Latanoprostene Bunod (VYZULTA) 0.024 % SOLN Place 1 drop into both eyes every morning. 5 mL 3   Multiple Vitamin (MULTIVITAMIN WITH MINERALS) TABS tablet Take 1 tablet by mouth daily.     Olopatadine HCl 0.2 % SOLN Place 1 drop into both eyes daily. One drop into each eye daily, as needed. 4.5 mL 3   amLODipine (NORVASC) 5 MG tablet TAKE 1 TABLET (5 MG TOTAL) BY MOUTH DAILY. 90 tablet 3   cyclobenzaprine (FLEXERIL) 10 MG tablet Take 1 tablet (10 mg total) by mouth 3 (three) times daily as needed for muscle spasms. (Patient not taking: Reported on 08/06/2021) 30 tablet 6   diclofenac Sodium (VOLTAREN) 1 % GEL Apply 4 g topically 4 (four) times daily. 100 g 6   gabapentin (NEURONTIN) 300 MG capsule Take 1 capsule (300 mg total) by mouth 2 (two) times daily. As needed. 180 capsule 3   ibuprofen (ADVIL) 800 MG tablet Take 1 tablet (800 mg total) by mouth every 8 (eight) hours as needed. 30 tablet 6   lisinopril-hydrochlorothiazide (ZESTORETIC) 20-12.5 MG tablet TAKE 1 TABLET BY MOUTH DAILY. 90 tablet 3   omeprazole (PRILOSEC) 40 MG capsule TAKE 1 CAPSULE (40 MG TOTAL) BY MOUTH DAILY. 30 capsule 11   No facility-administered medications prior to visit.    Allergies  Allergen Reactions   Hydrocortisone Hives   Lyrica [Pregabalin] Rash    ROS Review of Systems    Objective:    Physical Exam Constitutional:      Appearance: She is obese.  HENT:     Head: Normocephalic and atraumatic.  Cardiovascular:     Rate and Rhythm: Normal rate and regular rhythm.     Heart sounds: Normal heart sounds.  Pulmonary:     Effort: Pulmonary effort is normal.     Breath sounds: Normal breath  sounds.     Comments: Pain reproduced with deep breath Chest:     Chest wall: No mass or tenderness.  Abdominal:     Comments: hypoactive  Musculoskeletal:     Cervical back: Normal range of motion and neck supple.     Comments: Decreased ROM right arm due to pain Tenderness underneath shoulder blade  Skin:    General: Skin is warm and dry.     Capillary Refill:  Capillary refill takes less than 2 seconds.  Neurological:     General: No focal deficit present.     Mental Status: She is alert.  Psychiatric:        Mood and Affect: Mood normal.        Behavior: Behavior normal.   BP (!) 146/92 (BP Location: Left Arm, Patient Position: Sitting, Cuff Size: Normal)   Pulse 79   Temp (!) 97 F (36.1 C) (Skin)   Resp 20   Ht 5' 1.5" (1.562 m)   Wt 214 lb (97.1 kg)   SpO2 98%   BMI 39.78 kg/m  Wt Readings from Last 3 Encounters:  08/19/21 214 lb (97.1 kg)  07/02/21 213 lb 3.2 oz (96.7 kg)  02/16/21 222 lb (100.7 kg)     There are no preventive care reminders to display for this patient.   There are no preventive care reminders to display for this patient.  Lab Results  Component Value Date   TSH 1.360 02/16/2021   Lab Results  Component Value Date   WBC 5.6 02/16/2021   HGB 12.4 02/16/2021   HCT 36.4 02/16/2021   MCV 97 02/16/2021   PLT 258 02/16/2021   Lab Results  Component Value Date   NA 138 03/12/2020   K 4.0 03/12/2020   CO2 22 03/12/2020   GLUCOSE 99 03/12/2020   BUN 12 03/12/2020   CREATININE 0.75 03/12/2020   BILITOT 0.3 03/12/2020   ALKPHOS 66 03/12/2020   AST 16 03/12/2020   ALT 16 03/12/2020   PROT 7.4 03/12/2020   ALBUMIN 4.4 03/12/2020   CALCIUM 9.7 03/12/2020   ANIONGAP 6 06/04/2015   Lab Results  Component Value Date   CHOL 192 02/16/2021   Lab Results  Component Value Date   HDL 82 02/16/2021   Lab Results  Component Value Date   LDLCALC 97 02/16/2021   Lab Results  Component Value Date   TRIG 72 02/16/2021   Lab Results   Component Value Date   CHOLHDL 2.3 02/16/2021   Lab Results  Component Value Date   HGBA1C 5.4 03/12/2020      Assessment & Plan:   Problem List Items Addressed This Visit       Cardiovascular and Mediastinum   Essential hypertension - Primary Stable Encouraged on going compliance with current medication regimen Encouraged home monitoring and recording BP <130/80 Eating a heart-healthy diet with less salt Encouraged regular physical activity  Recommend Weight loss      Relevant Medications   amLODipine (NORVASC) 5 MG tablet   lisinopril-hydrochlorothiazide (ZESTORETIC) 20-12.5 MG tablet   Other Relevant Orders   Comp. Metabolic Panel (12)   POCT URINALYSIS DIP (CLINITEK) (Completed)     Other   Low back pain   Relevant Medications   cyclobenzaprine (FLEXERIL) 10 MG tablet   ibuprofen (ADVIL) 800 MG tablet   Metabolic syndrome   Relevant Orders   Comp. Metabolic Panel (12)   Lipid panel   Chronic foot pain, left   Relevant Medications   cyclobenzaprine (FLEXERIL) 10 MG tablet   gabapentin (NEURONTIN) 300 MG capsule   ibuprofen (ADVIL) 800 MG tablet   Other Visit Diagnoses     Atypical chest pain    Persistent  Evaluation as below    Relevant Orders   EKG 12-Lead   DG Chest 2 View (Completed)   CBC with Differential/Platelet   Comp. Metabolic Panel (12)   Lipid panel   DG Shoulder Right (Completed)  Right upper quadrant abdominal tenderness without rebound tenderness    Persistent  Evaluation as below    Relevant Orders   DG Abd 2 Views (Completed)   Lipid panel   Amylase   Lipase   Wrist pain, chronic, right       Relevant Medications   cyclobenzaprine (FLEXERIL) 10 MG tablet   gabapentin (NEURONTIN) 300 MG capsule   ibuprofen (ADVIL) 800 MG tablet   Other Relevant Orders   Sedimentation rate   Chronic right shoulder pain     Persistent  Evaluation and treatment as below   Relevant Medications   cyclobenzaprine (FLEXERIL) 10 MG tablet    gabapentin (NEURONTIN) 300 MG capsule   ibuprofen (ADVIL) 800 MG tablet   Other Relevant Orders   DG Shoulder Right (Completed)   Medication refill       Relevant Medications   amLODipine (NORVASC) 5 MG tablet   cyclobenzaprine (FLEXERIL) 10 MG tablet   diclofenac Sodium (VOLTAREN) 1 % GEL   gabapentin (NEURONTIN) 300 MG capsule   lisinopril-hydrochlorothiazide (ZESTORETIC) 20-12.5 MG tablet   omeprazole (PRILOSEC) 40 MG capsule   Chronic pain of right wrist       Relevant Medications   cyclobenzaprine (FLEXERIL) 10 MG tablet   diclofenac Sodium (VOLTAREN) 1 % GEL   gabapentin (NEURONTIN) 300 MG capsule   ibuprofen (ADVIL) 800 MG tablet   Gastroesophageal reflux disease without esophagitis     Persistent    Relevant Medications   omeprazole (PRILOSEC) 40 MG capsule       Meds ordered this encounter  Medications   amLODipine (NORVASC) 5 MG tablet    Sig: TAKE 1 TABLET (5 MG TOTAL) BY MOUTH DAILY.    Dispense:  90 tablet    Refill:  3    Order Specific Question:   Supervising Provider    Answer:   Tresa Garter [[id number]]   cyclobenzaprine (FLEXERIL) 10 MG tablet    Sig: Take 1 tablet (10 mg total) by mouth 3 (three) times daily as needed for muscle spasms.    Dispense:  30 tablet    Refill:  6    Order Specific Question:   Supervising Provider    Answer:   Tresa Garter [[id number]]   diclofenac Sodium (VOLTAREN) 1 % GEL    Sig: Apply 4 g topically 4 (four) times daily.    Dispense:  100 g    Refill:  6    Order Specific Question:   Supervising Provider    Answer:   Tresa Garter [[id number]]   gabapentin (NEURONTIN) 300 MG capsule    Sig: Take 1 capsule (300 mg total) by mouth 2 (two) times daily. As needed.    Dispense:  180 capsule    Refill:  3    Order Specific Question:   Supervising Provider    Answer:   Tresa Garter [[id number]]   lisinopril-hydrochlorothiazide (ZESTORETIC) 20-12.5 MG tablet    Sig: TAKE 1 TABLET BY MOUTH DAILY.     Dispense:  90 tablet    Refill:  3    Order Specific Question:   Supervising Provider    Answer:   Tresa Garter [[id number]]   omeprazole (PRILOSEC) 40 MG capsule    Sig: Take 1 capsule (40 mg total) by mouth daily.    Dispense:  90 capsule    Refill:  3    Order Specific Question:   Supervising Provider    Answer:   Tresa Garter [id number]  ibuprofen (ADVIL) 800 MG tablet    Sig: Take 1 tablet (800 mg total) by mouth every 8 (eight) hours as needed.    Dispense:  30 tablet    Refill:  6    Order Specific Question:   Supervising Provider    Answer:   Tresa Garter [id number]    Follow-up: Return in about 6 months (around 02/17/2022) for Follow up HTN [id number] , Follow up HTN [id number].    Vevelyn Francois, NP

## 2021-08-19 NOTE — Patient Instructions (Signed)
Nonspecific Chest Pain Chest pain can be caused by many different conditions. Some causes of chest pain can be life-threatening. These will require treatment right away. Serious causes of chest pain include: Heart attack. A tear in the body's main blood vessel. Redness and swelling (inflammation) around your heart. Blood clot in your lungs. Other causes of chest pain may not be so serious. These include: Heartburn. Anxiety or stress. Damage to bones or muscles in your chest. Lung infections. Chest pain can feel like: Pain or discomfort in your chest. Crushing, pressure, aching, or squeezing pain. Burning or tingling. Dull or sharp pain that is worse when you move, cough, or take a deep breath. Pain or discomfort that is also felt in your back, neck, jaw, shoulder, or arm, or pain that spreads to any of these areas. It is hard to know whether your pain is caused by something that is serious or something that is not so serious. So it is important to see your doctor right away if you have chest pain. Follow these instructions at home: Medicines Take over-the-counter and prescription medicines only as told by your doctor. If you were prescribed an antibiotic medicine, take it as told by your doctor. Do not stop taking the antibiotic even if you start to feel better. Lifestyle  Rest as told by your doctor. Do not use any products that contain nicotine or tobacco, such as cigarettes, e-cigarettes, and chewing tobacco. If you need help quitting, ask your doctor. Do not drink alcohol. Make lifestyle changes as told by your doctor. These may include: Getting regular exercise. Ask your doctor what activities are safe for you. Eating a heart-healthy diet. A diet and nutrition specialist (dietitian) can help you to learn healthy eating options. Staying at a healthy weight. Treating diabetes or high blood pressure, if needed. Lowering your stress. Activities such as yoga and relaxation techniques  can help. General instructions Pay attention to any changes in your symptoms. Tell your doctor about them or any new symptoms. Avoid any activities that cause chest pain. Keep all follow-up visits as told by your doctor. This is important. You may need more testing if your chest pain does not go away. Contact a doctor if: Your chest pain does not go away. You feel depressed. You have a fever. Get help right away if: Your chest pain is worse. You have a cough that gets worse, or you cough up blood. You have very bad (severe) pain in your belly (abdomen). You pass out (faint). You have either of these for no clear reason: Sudden chest discomfort. Sudden discomfort in your arms, back, neck, or jaw. You have shortness of breath at any time. You suddenly start to sweat, or your skin gets clammy. You feel sick to your stomach (nauseous). You throw up (vomit). You suddenly feel lightheaded or dizzy. You feel very weak or tired. Your heart starts to beat fast, or it feels like it is skipping beats. These symptoms may be an emergency. Do not wait to see if the symptoms will go away. Get medical help right away. Call your local emergency services (911 in the [place] Do not drive yourself to the hospital. Summary Chest pain can be caused by many different conditions. The cause may be serious and need treatment right away. If you have chest pain, see your doctor right away. Follow your doctor's instructions for taking medicines and making lifestyle changes. Keep all follow-up visits as told by your doctor. This includes visits for any further   testing if your chest pain does not go away. Be sure to know the signs that show that your condition has become worse. Get help right away if you have these symptoms. This information is not intended to replace advice given to you by your health care provider. Make sure you discuss any questions you have with your health care provider. Document Revised:  01/08/2021 Document Reviewed: 01/08/2021 Elsevier Patient Education  [place].

## 2021-08-20 ENCOUNTER — Other Ambulatory Visit: Payer: Self-pay

## 2021-08-20 LAB — CBC WITH DIFFERENTIAL/PLATELET
Basophils Absolute: 0 10*3/uL (ref 0.0–0.2)
Basos: 1 %
EOS (ABSOLUTE): 0.2 10*3/uL (ref 0.0–0.4)
Eos: 4 %
Hematocrit: 38.9 % (ref 34.0–46.6)
Hemoglobin: 13.2 g/dL (ref 11.1–15.9)
Immature Grans (Abs): 0 10*3/uL (ref 0.0–0.1)
Immature Granulocytes: 0 %
Lymphocytes Absolute: 1.7 10*3/uL (ref 0.7–3.1)
Lymphs: 40 %
MCH: 33.6 pg — ABNORMAL HIGH (ref 26.6–33.0)
MCHC: 33.9 g/dL (ref 31.5–35.7)
MCV: 99 fL — ABNORMAL HIGH (ref 79–97)
Monocytes Absolute: 0.5 10*3/uL (ref 0.1–0.9)
Monocytes: 12 %
Neutrophils Absolute: 1.9 10*3/uL (ref 1.4–7.0)
Neutrophils: 43 %
Platelets: 327 10*3/uL (ref 150–450)
RBC: 3.93 x10E6/uL (ref 3.77–5.28)
RDW: 12 % (ref 11.7–15.4)
WBC: 4.3 10*3/uL (ref 3.4–10.8)

## 2021-08-20 LAB — COMP. METABOLIC PANEL (12)
AST: 27 IU/L (ref 0–40)
Albumin/Globulin Ratio: 1.6 (ref 1.2–2.2)
Albumin: 4.6 g/dL (ref 3.8–4.8)
Alkaline Phosphatase: 65 IU/L (ref 44–121)
BUN/Creatinine Ratio: 11 — ABNORMAL LOW (ref 12–28)
BUN: 8 mg/dL (ref 8–27)
Bilirubin Total: 0.3 mg/dL (ref 0.0–1.2)
Calcium: 9.7 mg/dL (ref 8.7–10.3)
Chloride: 99 mmol/L (ref 96–106)
Creatinine, Ser: 0.76 mg/dL (ref 0.57–1.00)
Globulin, Total: 2.8 g/dL (ref 1.5–4.5)
Glucose: 91 mg/dL (ref 70–99)
Potassium: 3.7 mmol/L (ref 3.5–5.2)
Sodium: 141 mmol/L (ref 134–144)
Total Protein: 7.4 g/dL (ref 6.0–8.5)
eGFR: 89 mL/min/{1.73_m2} (ref >59)

## 2021-08-20 LAB — LIPID PANEL
Chol/HDL Ratio: 2.7 ratio (ref 0.0–4.4)
Cholesterol, Total: 207 mg/dL — ABNORMAL HIGH (ref 100–199)
HDL: 77 mg/dL (ref >39)
LDL Chol Calc (NIH): 116 mg/dL — ABNORMAL HIGH (ref 0–99)
Triglycerides: 79 mg/dL (ref 0–149)
VLDL Cholesterol Cal: 14 mg/dL (ref 5–40)

## 2021-08-20 LAB — SEDIMENTATION RATE: Sed Rate: 21 mm/hr (ref 0–40)

## 2021-08-20 LAB — LIPASE: Lipase: 42 U/L (ref 14–72)

## 2021-08-20 LAB — AMYLASE: Amylase: 76 U/L (ref 31–110)

## 2021-11-12 ENCOUNTER — Ambulatory Visit (INDEPENDENT_AMBULATORY_CARE_PROVIDER_SITE_OTHER): Payer: Commercial Managed Care - HMO | Admitting: Gastroenterology

## 2021-11-12 ENCOUNTER — Encounter: Payer: Self-pay | Admitting: Gastroenterology

## 2021-11-12 ENCOUNTER — Other Ambulatory Visit (INDEPENDENT_AMBULATORY_CARE_PROVIDER_SITE_OTHER): Payer: Commercial Managed Care - HMO

## 2021-11-12 VITALS — BP 120/80 | HR 88 | Ht 62.5 in | Wt 212.1 lb

## 2021-11-12 DIAGNOSIS — R1084 Generalized abdominal pain: Secondary | ICD-10-CM | POA: Diagnosis not present

## 2021-11-12 DIAGNOSIS — R634 Abnormal weight loss: Secondary | ICD-10-CM

## 2021-11-12 LAB — BASIC METABOLIC PANEL
BUN: 11 mg/dL (ref 6–23)
CO2: 30 mEq/L (ref 19–32)
Calcium: 9.2 mg/dL (ref 8.4–10.5)
Chloride: 103 mEq/L (ref 96–112)
Creatinine, Ser: 0.7 mg/dL (ref 0.40–1.20)
GFR: 93.1 mL/min (ref >60.00)
Glucose, Bld: 106 mg/dL — ABNORMAL HIGH (ref 70–99)
Potassium: 3.2 mEq/L — ABNORMAL LOW (ref 3.5–5.1)
Sodium: 139 mEq/L (ref 135–145)

## 2021-11-12 NOTE — Patient Instructions (Signed)
Your provider has requested that you go to the basement level for lab work before leaving today. Press "B" on the elevator. The lab is located at the first door on the left as you exit the elevator.  You have been scheduled for a CT scan of the abdomen and pelvis at [facility], 1st floor Radiology. You are scheduled on Thursday 11/19/21  at 3:30 pm. You should arrive 15 minutes prior to your appointment time for registration.  he solution may taste better if refrigerated, but do NOT add ice or any other liquid to this solution. Shake well before drinking.   Please follow the written instructions below on the day of your exam:   1) Do not eat anything after 1130 am (4 hours prior to your test).  2) Drink 1 bottle of contrast @ 1:30 pm (2 hours prior to your exam)  Remember to shake well before drinking and do NOT pour over ice.     Drink 1 bottle of contrast @ 2:30 pm (1 hour prior to your exam).   You may take any medications as prescribed with a small amount of water, if necessary. If you take any of the following medications: METFORMIN, GLUCOPHAGE, GLUCOVANCE, AVANDAMET, RIOMET, FORTAMET, [place] MET, JANUMET, GLUMETZA or METAGLIP, you MAY be asked to HOLD this medication 48 hours AFTER the exam.   The purpose of you drinking the oral contrast is to aid in the visualization of your intestinal tract. The contrast solution may cause some diarrhea. Depending on your individual set of symptoms, you may also receive an intravenous injection of x-ray contrast/dye. Plan on being at [facility] for 45 minutes or longer, depending on the type of exam you are having performed.   If you have any questions regarding your exam or if you need to reschedule, you may call [facility] at [phone] between the hours of 8:00 am and 5:00 pm, Monday-Friday.

## 2021-11-12 NOTE — Progress Notes (Signed)
11/12/2021 Deanna Floyd [id number] [birth year]   HISTORY OF PRESENT ILLNESS: This is a 61 year old female who is a patient of Dr. Corena Pilgrim.  She had a colonoscopy in January 2019 at which time she had a 4 mm tubular adenoma removed.  She also had a fibrous food fragment removed from the ascending colon at that time.  She is here today with complaints of intermittent sharp lower abdominal pain.  She says that she gets sharp pains a couple of times per month and then has some lower abdominal discomfort on and off as well.  She says it seems to be worse in the evenings and with movement, better in the mornings.  She says that she moves her bowels fairly well, no major complaints.  No rectal bleeding.  She admits that she just had a sister pass away so she has been under stress because of that has become more concerned about her health as well.  CBC, CMP, amylase, lipase, sed rate, urinalysis from October were unremarkable.  She has lost about 10 pounds over the last 9 months or so.  She says that she is here to make sure we do not think that she has any colon polyps or anything that is causing her abdominal pain.   Past Medical History:  Diagnosis Date   GERD (gastroesophageal reflux disease)    Hypertension    Stroke [facility]) 2007   Past Surgical History:  Procedure Laterality Date   FOOT SURGERY  2015   left side had 2 surgeries in 2015   [place]  2010    reports that she quit smoking about 11 years ago. Her smoking use included cigarettes. She has a 2.50 pack-year smoking history. She has never used smokeless tobacco. She reports current alcohol use. She reports that she does not use drugs. family history includes Breast cancer in her sister; Hypertension in her mother and sister. Allergies  Allergen Reactions   Hydrocortisone Hives   Lyrica [Pregabalin] Rash      Outpatient Encounter Medications as of 11/12/2021  Medication Sig   acetaminophen (TYLENOL) 500 MG tablet  Take 1 tablet (500 mg total) by mouth every 6 (six) hours as needed.   amLODipine (NORVASC) 5 MG tablet TAKE 1 TABLET (5 MG TOTAL) BY MOUTH DAILY.   cyclobenzaprine (FLEXERIL) 10 MG tablet Take 1 tablet (10 mg total) by mouth 3 (three) times daily as needed for muscle spasms.   diclofenac (VOLTAREN) 75 MG EC tablet TAKE 1 TABLET (75 MG TOTAL) BY MOUTH 2 (TWO) TIMES DAILY AS NEEDED.   diclofenac Sodium (VOLTAREN) 1 % GEL Apply 4 g topically 4 (four) times daily.   diphenhydrAMINE (BENADRYL) 2 % cream Apply topically 3 (three) times daily as needed for itching.   gabapentin (NEURONTIN) 300 MG capsule Take 1 capsule (300 mg total) by mouth 2 (two) times daily. As needed.   ibuprofen (ADVIL) 800 MG tablet Take 1 tablet (800 mg total) by mouth every 8 (eight) hours as needed.   Latanoprostene Bunod (VYZULTA) 0.024 % SOLN Place 1 drop into both eyes every morning.   lisinopril-hydrochlorothiazide (ZESTORETIC) 20-12.5 MG tablet TAKE 1 TABLET BY MOUTH DAILY.   Multiple Vitamin (MULTIVITAMIN WITH MINERALS) TABS tablet Take 1 tablet by mouth daily.   Olopatadine HCl 0.2 % SOLN Place 1 drop into both eyes daily. One drop into each eye daily, as needed.   omeprazole (PRILOSEC) 40 MG capsule Take 1 capsule (40 mg total) by mouth daily.   [  DISCONTINUED] cetirizine (ZYRTEC) 10 MG tablet Take 1 tablet (10 mg total) by mouth daily.   No facility-administered encounter medications on file as of 11/12/2021.     REVIEW OF SYSTEMS  : All other systems reviewed and negative except where noted in the History of Present Illness.   PHYSICAL EXAM: BP 120/80 (BP Location: Left Arm, Patient Position: Sitting, Cuff Size: Normal)    Pulse 88    Ht 5' 2.5" (1.588 m) Comment: height measured without shoes   Wt 212 lb 2 oz (96.2 kg)    BMI 38.18 kg/m  General: Well developed AA female in no acute distress Head: Normocephalic and atraumatic Eyes:  Sclerae anicteric, conjunctiva pink. Ears: Normal auditory acuity Lungs:  Clear throughout to auscultation; no W/R/R. Heart: Regular rate and rhythm; no M/R/G. Abdomen: Soft, non-distended.  BS present.  Minimal lower abdominal TTP. Musculoskeletal: Symmetrical with no gross deformities  Skin: No lesions on visible extremities Extremities: No edema  Neurological: Alert oriented x 4, grossly non-focal Psychological:  Alert and cooperative. Normal mood and affect  ASSESSMENT AND PLAN: *61 year old female with complaints of intermittent sharp lower abdominal pain with associated low back pain as well, more so with movement.  Really no other GI complaints.  About a 10 pound weight loss over the past 9 months, question intentional versus unintentional.  Colonoscopy 11/2017 with small tubular adenoma removed.  I feel like her pain may be more musculoskeletal related to her back versus hips.  We will plan for CT scan of the abdomen and pelvis with contrast.  If that is unremarkable then she may need to seek evaluation from her primary care provider for other causes.  We will check BMP today.   CC:  Vevelyn Francois, NP

## 2021-11-13 NOTE — Progress Notes (Signed)
____________________________________________________________  Attending physician addendum:  Thank you for sending this case to me. I have reviewed the entire note and agree with the plan.  Agree CT scan warranted to rule out neoplastic or inflammatory cause of symptoms, though seems less likely without bowel habit changes.  Wilfrid Lund, MD  ____________________________________________________________

## 2021-11-19 ENCOUNTER — Other Ambulatory Visit: Payer: Self-pay

## 2021-11-19 ENCOUNTER — Ambulatory Visit (HOSPITAL_COMMUNITY)
Admission: RE | Admit: 2021-11-19 | Discharge: 2021-11-19 | Disposition: A | Discharge status: Home / Self Care | Payer: Medicare Other | Source: Ambulatory Visit | Attending: Gastroenterology | Admitting: Gastroenterology

## 2021-11-19 DIAGNOSIS — R1084 Generalized abdominal pain: Secondary | ICD-10-CM | POA: Insufficient documentation

## 2021-11-19 DIAGNOSIS — R634 Abnormal weight loss: Secondary | ICD-10-CM | POA: Diagnosis present

## 2021-11-19 MED ORDER — IOHEXOL 350 MG/ML SOLN
100.0000 mL | Freq: Once | INTRAVENOUS | Status: AC | PRN
  Administered 2021-11-19: 80 mL via INTRAVENOUS

## 2021-11-20 ENCOUNTER — Other Ambulatory Visit: Payer: Self-pay

## 2021-11-20 DIAGNOSIS — N3289 Other specified disorders of bladder: Secondary | ICD-10-CM

## 2021-11-26 ENCOUNTER — Other Ambulatory Visit (INDEPENDENT_AMBULATORY_CARE_PROVIDER_SITE_OTHER): Payer: Commercial Managed Care - HMO

## 2021-11-26 DIAGNOSIS — N3289 Other specified disorders of bladder: Secondary | ICD-10-CM

## 2021-11-26 LAB — URINALYSIS, ROUTINE W REFLEX MICROSCOPIC
Bilirubin Urine: NEGATIVE
Hgb urine dipstick: NEGATIVE
Ketones, ur: NEGATIVE
Leukocytes,Ua: NEGATIVE
Nitrite: NEGATIVE
Specific Gravity, Urine: 1.01 (ref 1.000–1.030)
Total Protein, Urine: NEGATIVE
Urine Glucose: NEGATIVE
Urobilinogen, UA: 0.2 (ref 0.0–1.0)
pH: 7 (ref 5.0–8.0)

## 2021-12-21 ENCOUNTER — Other Ambulatory Visit: Payer: Self-pay

## 2021-12-21 ENCOUNTER — Encounter: Payer: Self-pay | Admitting: Nurse Practitioner

## 2021-12-21 ENCOUNTER — Ambulatory Visit (HOSPITAL_COMMUNITY)
Admission: RE | Admit: 2021-12-21 | Discharge: 2021-12-21 | Disposition: A | Discharge status: Home / Self Care | Payer: Medicare Other | Source: Ambulatory Visit | Attending: Nurse Practitioner | Admitting: Nurse Practitioner

## 2021-12-21 ENCOUNTER — Ambulatory Visit (INDEPENDENT_AMBULATORY_CARE_PROVIDER_SITE_OTHER): Payer: Medicare Other | Admitting: Nurse Practitioner

## 2021-12-21 VITALS — BP 123/79 | HR 78 | Temp 98.0°F | Ht 61.5 in | Wt 210.2 lb

## 2021-12-21 DIAGNOSIS — Z760 Encounter for issue of repeat prescription: Secondary | ICD-10-CM

## 2021-12-21 DIAGNOSIS — M1711 Unilateral primary osteoarthritis, right knee: Secondary | ICD-10-CM | POA: Insufficient documentation

## 2021-12-21 DIAGNOSIS — N951 Menopausal and female climacteric states: Secondary | ICD-10-CM

## 2021-12-21 DIAGNOSIS — M79672 Pain in left foot: Secondary | ICD-10-CM

## 2021-12-21 DIAGNOSIS — M2341 Loose body in knee, right knee: Secondary | ICD-10-CM | POA: Diagnosis not present

## 2021-12-21 DIAGNOSIS — M5441 Lumbago with sciatica, right side: Secondary | ICD-10-CM

## 2021-12-21 DIAGNOSIS — K5900 Constipation, unspecified: Secondary | ICD-10-CM

## 2021-12-21 DIAGNOSIS — M5442 Lumbago with sciatica, left side: Secondary | ICD-10-CM

## 2021-12-21 DIAGNOSIS — M25531 Pain in right wrist: Secondary | ICD-10-CM

## 2021-12-21 DIAGNOSIS — M25561 Pain in right knee: Secondary | ICD-10-CM

## 2021-12-21 DIAGNOSIS — G8929 Other chronic pain: Secondary | ICD-10-CM

## 2021-12-21 DIAGNOSIS — Z23 Encounter for immunization: Secondary | ICD-10-CM

## 2021-12-21 MED ORDER — DICLOFENAC SODIUM 1 % EX GEL
4.0000 g | Freq: Four times a day (QID) | CUTANEOUS | 6 refills | Status: DC
  Filled 2021-12-21: qty 100, 6d supply, fill #0

## 2021-12-21 MED ORDER — IBUPROFEN 800 MG PO TABS
800.0000 mg | ORAL_TABLET | Freq: Three times a day (TID) | ORAL | 6 refills | Status: DC | PRN
  Filled 2021-12-21: qty 30, 10d supply, fill #0
  Filled 2022-02-22: qty 30, 10d supply, fill #1
  Filled 2022-05-20: qty 30, 10d supply, fill #2

## 2021-12-21 NOTE — Progress Notes (Signed)
Subjective:    Deanna Floyd is a 61 y.o. female who presents with knee pain involving the right knee. Onset was sudden, not related to any specific activity. Inciting event: none known. Current symptoms include: pain located upper outer portion and across, stiffness, swelling, and heavicness . Pain is aggravated by any weight bearing. Patient has had no prior knee problems. Evaluation to date: none. Treatment to date: avoidance of offending activity and prescription NSAIDS which are not very effective.  The following portions of the patient's history were reviewed and updated as appropriate: allergies, current medications, past family history, past medical history, past social history, past surgical history, and problem list.   She had a CT scan of abdomen and pelvis. L5-S1 discogenic disease with bilateral neural foraminal impingement and canal narrowing. She denies any pain in her back. She was also tod about constipation which she denies. Miralax was used.  She also complains of hot flashes reoccurring.  She went to menopause ages [age] .   Review of Systems Pertinent items are noted in HPI.   Objective:    BP 123/79    Pulse 78    Temp 98 F (36.7 C)    Ht 5' 1.5" (1.562 m)    Wt 210 lb 3.2 oz (95.3 kg)    SpO2 99%    BMI 39.07 kg/m  Right knee: positive exam findings: effusion and lateral joint line tenderness  Left knee:  normal and no effusion, full active range of motion, no joint line tenderness, ligamentous structures intact.     Assessment:    Right Mild osteoarthritis on the right    Plan:    Natural history and expected course discussed. Questions answered. Scientist, clinical (histocompatibility and immunogenetics) distributed. Rest, ice, compression, and elevation (RICE) therapy. PT referral. Plain film x-rays.

## 2021-12-21 NOTE — Patient Instructions (Addendum)
Salonpas patches every 8 hours for pain  Acute Knee Pain, Adult Acute knee pain is sudden and may be caused by damage, swelling, or irritation of the muscles and tissues that support the knee. Pain may result from: A fall. An injury to the knee from twisting motions. A hit to the knee. Infection. Acute knee pain may go away on its own with time and rest. If it does not, your health care provider may order tests to find the cause of the pain. These may include: Imaging tests, such as an X-ray, MRI, CT scan, or ultrasound. Joint aspiration. In this test, fluid is removed from the knee and evaluated. Arthroscopy. In this test, a lighted tube is inserted into the knee and an image is projected onto a TV screen. Biopsy. In this test, a sample of tissue is removed from the body and studied under a microscope. Follow these instructions at home: If you have a knee sleeve or brace:  Wear the knee sleeve or brace as told by your health care provider. Remove it only as told by your health care provider. Loosen it if your toes tingle, become numb, or turn cold and blue. Keep it clean. If the knee sleeve or brace is not waterproof: Do not let it get wet. Cover it with a watertight covering when you take a bath or shower. Activity Rest your knee. Do not do things that cause pain or make pain worse. Avoid high-impact activities or exercises, such as running, jumping rope, or doing jumping jacks. Work with a physical therapist to make a safe exercise program, as recommended by your health care provider. Do exercises as told by your physical therapist. Managing pain, stiffness, and swelling  If directed, put ice on the affected knee. To do this: If you have a removable knee sleeve or brace, remove it as told by your health care provider. Put ice in a plastic bag. Place a towel between your skin and the bag. Leave the ice on for 20 minutes, 2-3 times a day. Remove the ice if your skin turns bright  red. This is very important. If you cannot feel pain, heat, or cold, you have a greater risk of damage to the area. If directed, use an elastic bandage to put pressure (compression) on your injured knee. This may control swelling, give support, and help with discomfort. Raise (elevate) your knee above the level of your heart while you are sitting or lying down. Sleep with a pillow under your knee. General instructions Take over-the-counter and prescription medicines only as told by your health care provider. Do not use any products that contain nicotine or tobacco, such as cigarettes, e-cigarettes, and chewing tobacco. If you need help quitting, ask your health care provider. If you are overweight, work with your health care provider and a dietitian to set a weight-loss goal that is healthy and reasonable for you. Extra weight can put pressure on your knee. Pay attention to any changes in your symptoms. Keep all follow-up visits. This is important. Contact a health care provider if: Your knee pain continues, changes, or gets worse. You have a fever along with knee pain. Your knee feels warm to the touch or is red. Your knee buckles or locks up. Get help right away if: Your knee swells, and the swelling becomes worse. You cannot move your knee. You have severe pain in your knee that cannot be managed with pain medicine. Summary Acute knee pain can be caused by a fall, an  injury, an infection, or damage, swelling, or irritation of the tissues that support your knee. Your health care provider may perform tests to find out the cause of the pain. Pay attention to any changes in your symptoms. Relieve your pain with rest, medicines, light activity, and the use of ice. Get help right away if your knee swells, you cannot move your knee, or you have severe pain that cannot be managed with medicine. This information is not intended to replace advice given to you by your health care provider. Make sure  you discuss any questions you have with your health care provider. Document Revised: 04/09/2020 Document Reviewed: 04/09/2020 Elsevier Patient Education  [place]. Knee Exercises Ask your health care provider which exercises are safe for you. Do exercises exactly as told by your health care provider and adjust them as directed. It is normal to feel mild stretching, pulling, tightness, or discomfort as you do these exercises. Stop right away if you feel sudden pain or your pain gets worse. Do not begin these exercises until told by your health care provider. Stretching and range-of-motion exercises These exercises warm up your muscles and joints and improve the movement and flexibility of your knee. These exercises also help to relieve pain and swelling. Knee extension, prone  Lie on your abdomen (prone position) on a bed. Place your left / right knee just beyond the edge of the surface so your knee is not on the bed. You can put a towel under your left / right thigh just above your kneecap for comfort. Relax your leg muscles and allow gravity to straighten your knee (extension). You should feel a stretch behind your left / right knee. Hold this position for __________ seconds. Scoot up so your knee is supported between repetitions. Repeat __________ times. Complete this exercise __________ times a day. Knee flexion, active  Lie on your back with both legs straight. If this causes back discomfort, bend your left / right knee so your foot is flat on the floor. Slowly slide your left / right heel back toward your buttocks. Stop when you feel a gentle stretch in the front of your knee or thigh (flexion). Hold this position for __________ seconds. Slowly slide your left / right heel back to the starting position. Repeat __________ times. Complete this exercise __________ times a day. Quadriceps stretch, prone  Lie on your abdomen on a firm surface, such as a bed or padded floor. Bend  your left / right knee and hold your ankle. If you cannot reach your ankle or pant leg, loop a belt around your foot and grab the belt instead. Gently pull your heel toward your buttocks. Your knee should not slide out to the side. You should feel a stretch in the front of your thigh and knee (quadriceps). Hold this position for __________ seconds. Repeat __________ times. Complete this exercise __________ times a day. Hamstring, supine  Lie on your back (supine position). Loop a belt or towel over the ball of your left / right foot. The ball of your foot is on the walking surface, right under your toes. Straighten your left / right knee and slowly pull on the belt to raise your leg until you feel a gentle stretch behind your knee (hamstring). Do not let your knee bend while you do this. Keep your other leg flat on the floor. Hold this position for __________ seconds. Repeat __________ times. Complete this exercise __________ times a day. Strengthening exercises These exercises build  strength and endurance in your knee. Endurance is the ability to use your muscles for a long time, even after they get tired. Quadriceps, isometric This exercise strengthens the muscles in front of your thigh (quadriceps) without moving your knee joint (isometric). Lie on your back with your left / right leg extended and your other knee bent. Put a rolled towel or small pillow under your knee if told by your health care provider. Slowly tense the muscles in the front of your left / right thigh. You should see your kneecap slide up toward your hip or see increased dimpling just above the knee. This motion will push the back of the knee toward the floor. For __________ seconds, hold the muscle as tight as you can without increasing your pain. Relax the muscles slowly and completely. Repeat __________ times. Complete this exercise __________ times a day. Straight leg raises This exercise strengthens the muscles in  front of your thigh (quadriceps) and the muscles that move your hips (hip flexors). Lie on your back with your left / right leg extended and your other knee bent. Tense the muscles in the front of your left / right thigh. You should see your kneecap slide up or see increased dimpling just above the knee. Your thigh may even shake a bit. Keep these muscles tight as you raise your leg 4-6 inches (10-15 cm) off the floor. Do not let your knee bend. Hold this position for __________ seconds. Keep these muscles tense as you lower your leg. Relax your muscles slowly and completely after each repetition. Repeat __________ times. Complete this exercise __________ times a day. Hamstring, isometric  Lie on your back on a firm surface. Bend your left / right knee about __________ degrees. Dig your left / right heel into the surface as if you are trying to pull it toward your buttocks. Tighten the muscles in the back of your thighs (hamstring) to "dig" as hard as you can without increasing any pain. Hold this position for __________ seconds. Release the tension gradually and allow your muscles to relax completely for __________ seconds after each repetition. Repeat __________ times. Complete this exercise __________ times a day. Hamstring curls If told by your health care provider, do this exercise while wearing ankle weights. Begin with __________lb / kg weights. Then increase the weight by 1 lb (0.5 kg) increments. Do not wear ankle weights that are more than __________lb / kg. Lie on your abdomen with your legs straight. Bend your left / right knee as far as you can without feeling pain. Keep your hips flat against the floor. Hold this position for __________ seconds. Slowly lower your leg to the starting position. Repeat __________ times. Complete this exercise __________ times a day. Squats This exercise strengthens the muscles in front of your thigh and knee (quadriceps). Stand in front of a  table, with your feet and knees pointing straight ahead. You may rest your hands on the table for balance but not for support. Slowly bend your knees and lower your hips like you are going to sit in a chair. Keep your weight over your heels, not over your toes. Keep your lower legs upright so they are parallel with the table legs. Do not let your hips go lower than your knees. Do not bend lower than told by your health care provider. If your knee pain increases, do not bend as low. Hold the squat position for __________ seconds. Slowly push with your legs to return to standing.  Do not use your hands to pull yourself to standing. Repeat __________ times. Complete this exercise __________ times a day. Wall slides This exercise strengthens the muscles in front of your thigh and knee (quadriceps). Lean your back against a smooth wall or door, and walk your feet out 18-24 inches (46-61 cm) from it. Place your feet hip-width apart. Slowly slide down the wall or door until your knees bend __________ degrees. Keep your knees over your heels, not over your toes. Keep your knees in line with your hips. Hold this position for __________ seconds. Repeat __________ times. Complete this exercise __________ times a day. Straight leg raises, side-lying This exercise strengthens the muscles that rotate the leg at the hip and move it away from your body (hip abductors). Lie on your side with your left / right leg in the top position. Lie so your head, shoulder, knee, and hip line up. You may bend your bottom knee to help you keep your balance. Roll your hips slightly forward so your hips are stacked directly over each other and your left / right knee is facing forward. Leading with your heel, lift your top leg 4-6 inches (10-15 cm). You should feel the muscles in your outer hip lifting. Do not let your foot drift forward. Do not let your knee roll toward the ceiling. Hold this position for __________  seconds. Slowly return your leg to the starting position. Let your muscles relax completely after each repetition. Repeat __________ times. Complete this exercise __________ times a day. Straight leg raises, prone This exercise stretches the muscles that move your hips away from the front of the pelvis (hip extensors). Lie on your abdomen on a firm surface. You can put a pillow under your hips if that is more comfortable. Tense the muscles in your buttocks and lift your left / right leg about 4-6 inches (10-15 cm). Keep your knee straight as you lift your leg. Hold this position for __________ seconds. Slowly lower your leg to the starting position. Let your leg relax completely after each repetition. Repeat __________ times. Complete this exercise __________ times a day. This information is not intended to replace advice given to you by your health care provider. Make sure you discuss any questions you have with your health care provider. Document Revised: 07/07/2021 Document Reviewed: 07/07/2021 Elsevier Patient Education  2022 [place], Cimicifuga racemosa oral dosage forms What is this medication? BLACK COHOSH (blak  KOH hosh) or Cimicifuga racemosa is a dietary supplement. It is promoted to relieve symptoms of menopause, such as hot flashes. The FDA has not approved this supplement for any medical use. This supplement may be used for other purposes; ask your health care provider or pharmacist if you have questions. This medicine may be used for other purposes; ask your health care provider or pharmacist if you have questions. What should I tell my care team before I take this medication? They need to know if you have any of these conditions: breast cancer cervical, ovarian or uterine cancer high blood pressure infertility liver disease menstrual changes or irregular periods unusual vaginal or uterine bleeding an unusual or allergic reaction to black cohosh,  soybeans, tartrazine dye (yellow dye number 5), other medicines, foods, dyes, or preservatives pregnant or trying to get pregnant breast-feeding How should I use this medication? Take this herb by mouth with a glass of water. Follow the directions on the package labeling, or talk to your health care professional. Do not use for  longer than 6 months without the advice of a health care professional. Do not use if you are pregnant or breast-feeding. Talk to your obstetrician-gynecologist or certified nurse-midwife. This herb is not for use in children under the age of 33 years. Overdosage: If you think you have taken too much of this medicine contact a poison control center or emergency room at once. NOTE: This medicine is only for you. Do not share this medicine with others. What if I miss a dose? If you miss a dose, take it as soon as you can. If it is almost time for your next dose, take only that dose. Do not take double or extra doses. What may interact with this medication? atorvastatin cisplatin fertility treatments This list may not describe all possible interactions. Give your health care provider a list of all the medicines, herbs, non-prescription drugs, or dietary supplements you use. Also tell them if you smoke, drink alcohol, or use illegal drugs. Some items may interact with your medicine. What should I watch for while using this medication? Since this herb is derived from a plant, allergic reactions are possible. Stop using this herb if you develop a rash. You may need to see your health care professional, or inform them that this occurred. Report any unusual side effects promptly. If you are taking this herb for menstrual or menopausal symptoms, visit your doctor or health care professional for regular checks on your progress. You should have a complete check-up every 6 months. You will need a regular breast and pelvic exam while on this therapy. Follow the advice of your doctor or  health care professional. Women should inform their doctor if they wish to become pregnant or think they might be pregnant. If you have any reason to think you are pregnant, stop taking this herb at once and contact your doctor or health care professional. Herbal or dietary supplements are not regulated like medicines. Rigid quality control standards are not required for dietary supplements. The purity and strength of these products can vary. The safety and effect of this dietary supplement for a certain disease or illness is not well known. This product is not intended to diagnose, treat, cure or prevent any disease. The Food and Drug Administration suggests the following to help consumers protect themselves: Always read product labels and follow directions. Natural does not mean a product is safe for humans to take. Look for products that include USP after the ingredient name. This means that the manufacturer followed the standards of the [place] Pharmacopoeia. Supplements made or sold by a nationally known food or drug company are more likely to be made under tight controls. You can write to the company for more information about how the product was made. What side effects may I notice from receiving this medication? Side effects that you should report to your doctor or health care professional as soon as possible: allergic reactions like skin rash, itching or hives, swelling of the face, lips, or tongue breathing problems dizziness palpitations signs and symptoms of liver injury like dark yellow or brown urine; general ill feeling or flu-like symptoms; light-colored stools; loss of appetite; nausea; right upper belly pain; unusually weak or tired; yellowing of the eyes or skin unusual vaginal bleeding Side effects that usually do not require medical attention (report to your doctor or health care professional if they continue or are bothersome): breast tenderness headache nausea upset  stomach This list may not describe all possible side effects. Call your doctor for  medical advice about side effects. You may report side effects to FDA at 1-800-FDA-1088. Where should I keep my medication? Keep out of the reach of children. Store at room temperature between 15 and 30 degrees C (59 and 86 degrees C). Throw away any unused herb after the expiration date. NOTE: This sheet is a summary. It may not cover all possible information. If you have questions about this medicine, talk to your doctor, pharmacist, or health care provider.  2022 Elsevier/Gold Standard (2016-05-05 00:00:00)

## 2021-12-24 ENCOUNTER — Other Ambulatory Visit: Payer: Self-pay

## 2021-12-25 ENCOUNTER — Other Ambulatory Visit: Payer: Self-pay

## 2022-02-18 ENCOUNTER — Ambulatory Visit: Payer: Medicare Other | Admitting: Nurse Practitioner

## 2022-02-19 ENCOUNTER — Ambulatory Visit: Payer: Medicare Other | Admitting: Nurse Practitioner

## 2022-02-22 ENCOUNTER — Other Ambulatory Visit: Payer: Self-pay

## 2022-02-24 ENCOUNTER — Other Ambulatory Visit: Payer: Self-pay

## 2022-03-09 ENCOUNTER — Encounter: Payer: Self-pay | Admitting: Nurse Practitioner

## 2022-03-09 ENCOUNTER — Ambulatory Visit (INDEPENDENT_AMBULATORY_CARE_PROVIDER_SITE_OTHER): Payer: Medicare Other | Admitting: Nurse Practitioner

## 2022-03-09 ENCOUNTER — Other Ambulatory Visit: Payer: Self-pay

## 2022-03-09 VITALS — BP 134/88 | HR 73 | Temp 97.2°F | Ht 61.5 in | Wt 210.5 lb

## 2022-03-09 DIAGNOSIS — M5441 Lumbago with sciatica, right side: Secondary | ICD-10-CM | POA: Diagnosis not present

## 2022-03-09 DIAGNOSIS — M5442 Lumbago with sciatica, left side: Secondary | ICD-10-CM | POA: Diagnosis not present

## 2022-03-09 DIAGNOSIS — I10 Essential (primary) hypertension: Secondary | ICD-10-CM

## 2022-03-09 DIAGNOSIS — G8929 Other chronic pain: Secondary | ICD-10-CM | POA: Diagnosis not present

## 2022-03-09 DIAGNOSIS — Z6841 Body Mass Index (BMI) 40.0 and over, adult: Secondary | ICD-10-CM

## 2022-03-09 LAB — POCT URINALYSIS DIP (CLINITEK)
Bilirubin, UA: NEGATIVE
Blood, UA: NEGATIVE
Glucose, UA: NEGATIVE mg/dL
Ketones, POC UA: NEGATIVE mg/dL
Leukocytes, UA: NEGATIVE
Nitrite, UA: NEGATIVE
POC PROTEIN,UA: NEGATIVE
Spec Grav, UA: 1.025 (ref 1.010–1.025)
Urobilinogen, UA: 0.2 E.U./dL
pH, UA: 5.5 (ref 5.0–8.0)

## 2022-03-09 LAB — POCT GLYCOSYLATED HEMOGLOBIN (HGB A1C)
HbA1c POC (<> result, manual entry): 5.4 % (ref 4.0–5.6)
HbA1c, POC (controlled diabetic range): 5.4 % (ref 0.0–7.0)
HbA1c, POC (prediabetic range): 5.4 % — AB (ref 5.7–6.4)
Hemoglobin A1C: 5.4 % (ref 4.0–5.6)

## 2022-03-09 MED ORDER — LIDOCAINE 4 % EX PTCH
1.0000 | MEDICATED_PATCH | CUTANEOUS | 0 refills | Status: AC
  Filled 2022-03-09: qty 15, 15d supply, fill #0

## 2022-03-09 NOTE — Progress Notes (Signed)
? ?[place] ?[place], [place]  [address] ?Phone:  [phone]   Fax:  [phone] ?Subjective:  ? Patient ID: Deanna Floyd, female    DOB: 1960-12-27, 61 y.o.   MRN: [id number] ? ?Chief Complaint  ?Patient presents with  ? Follow-up  ?  Pt is here for 6 months follow up. Pt stated her rt lowe back and groin hurts pt had this pain for about 3 days  ? ?HPI ?Trula Yearwood 61 y.o. female  has a past medical history of GERD (gastroesophageal reflux disease), Hypertension, and Stroke ([place]) (2007). To the [facility] for reevaluation of HTN and low back pain. ? ?Hypertension: Patient here for follow-up of elevated blood pressure. She is exercising and is adherent to low salt diet.  Blood pressure is well controlled at home. Cardiac symptoms none. Patient denies none.  Cardiovascular risk factors: hypertension and obesity (BMI >= 30 kg/m2). Use of agents associated with hypertension: none. History of target organ damage: none. B/P at home similar to today's.  ? ?Patient also concerned about right lower back pain that radiates to right groin x 3 days. States that she has been doing a lot of work at home in preparation for housing inspection. Pain the most pronounced after long day of chores and at night. Has been taking ibuprofen with moderate improvement in symptoms, but was still concerned about possible UTI. Denies any dysuria.  ? ?Denies any other concerns today. Denies any fatigue, chest pain, shortness of breath, HA or dizziness. Denies any blurred vision, numbness or tingling. ? ?Past Medical History:  ?Diagnosis Date  ? GERD (gastroesophageal reflux disease)   ? Hypertension   ? Stroke [facility]) 2007  ? ? ?Past Surgical History:  ?Procedure Laterality Date  ? FOOT SURGERY  2015  ? left side had 2 surgeries in 2015  ? TRIGGER FINGER RELEASE  2010  ? ? ?Family History  ?Problem Relation Age of Onset  ? Hypertension Mother   ? Hypertension Sister   ? Breast cancer Sister   ? Colon  cancer Neg Hx   ? Stomach cancer Neg Hx   ? ? ?Social History  ? ?Socioeconomic History  ? Marital status: Single  ?  Spouse name: Not on file  ? Number of children: Not on file  ? Years of education: Not on file  ? Highest education level: Not on file  ?Occupational History  ? Not on file  ?Tobacco Use  ? Smoking status: Former  ?  Packs/day: 0.25  ?  Years: 10.00  ?  Pack years: 2.50  ?  Types: Cigarettes  ?  Quit date: 2012  ?  Years since quitting: 11.3  ? Smokeless tobacco: Never  ?Vaping Use  ? Vaping Use: Never used  ?Substance and Sexual Activity  ? Alcohol use: Yes  ?  Comment: occ wine   ? Drug use: No  ? Sexual activity: Not Currently  ?Other Topics Concern  ? Not on file  ?Social History Narrative  ? Not on file  ? ?Social Determinants of Health  ? ?Financial Resource Strain: Low Risk   ? Difficulty of Paying Living Expenses: Not very hard  ?Food Insecurity: No Food Insecurity  ? Worried About Charity fundraiser in the Last Year: Never true  ? Ran Out of Food in the Last Year: Never true  ?Transportation Needs: No Transportation Needs  ? Lack of Transportation (Medical): No  ? Lack of Transportation (Non-Medical): No  ?  Physical Activity: Insufficiently Active  ? Days of Exercise per Week: 1 day  ? Minutes of Exercise per Session: 20 min  ?Stress: No Stress Concern Present  ? Feeling of Stress : Only a little  ?Social Connections: Moderately Integrated  ? Frequency of Communication with Friends and Family: More than three times a week  ? Frequency of Social Gatherings with Friends and Family: More than three times a week  ? Attends Religious Services: More than 4 times per year  ? Active Member of Clubs or Organizations: Yes  ? Attends Archivist Meetings: More than 4 times per year  ? Marital Status: Never married  ?Intimate Partner Violence: Not At Risk  ? Fear of Current or Ex-Partner: No  ? Emotionally Abused: No  ? Physically Abused: No  ? Sexually Abused: No  ? ? ?Outpatient Medications  Prior to Visit  ?Medication Sig Dispense Refill  ? amLODipine (NORVASC) 5 MG tablet TAKE 1 TABLET (5 MG TOTAL) BY MOUTH DAILY. 90 tablet 3  ? cyclobenzaprine (FLEXERIL) 10 MG tablet Take 1 tablet (10 mg total) by mouth 3 (three) times daily as needed for muscle spasms. 30 tablet 6  ? diclofenac Sodium (VOLTAREN) 1 % GEL Apply 4 g topically 4 (four) times daily. 100 g 6  ? diphenhydrAMINE (BENADRYL) 2 % cream Apply topically 3 (three) times daily as needed for itching. 30 g 6  ? gabapentin (NEURONTIN) 300 MG capsule Take 1 capsule (300 mg total) by mouth 2 (two) times daily. As needed. 180 capsule 3  ? ibuprofen (ADVIL) 800 MG tablet Take 1 tablet (800 mg total) by mouth every 8 (eight) hours as needed. 30 tablet 6  ? Latanoprostene Bunod (VYZULTA) 0.024 % SOLN Place 1 drop into both eyes every morning. 5 mL 3  ? lisinopril-hydrochlorothiazide (ZESTORETIC) 20-12.5 MG tablet TAKE 1 TABLET BY MOUTH DAILY. 90 tablet 3  ? Multiple Vitamin (MULTIVITAMIN WITH MINERALS) TABS tablet Take 1 tablet by mouth daily.    ? Olopatadine HCl 0.2 % SOLN Place 1 drop into both eyes daily. One drop into each eye daily, as needed. 4.5 mL 3  ? omeprazole (PRILOSEC) 40 MG capsule Take 1 capsule (40 mg total) by mouth daily. 90 capsule 3  ? acetaminophen (TYLENOL) 500 MG tablet Take 1 tablet (500 mg total) by mouth every 6 (six) hours as needed. (Patient not taking: Reported on 03/09/2022) 30 tablet 6  ? ?No facility-administered medications prior to visit.  ? ? ?Allergies  ?Allergen Reactions  ? Hydrocortisone Hives  ? Lyrica [Pregabalin] Rash  ? ? ?Review of Systems  ?Constitutional:  Negative for chills, fever and malaise/fatigue.  ?Respiratory:  Negative for cough and shortness of breath.   ?Cardiovascular:  Negative for chest pain, palpitations and leg swelling.  ?Gastrointestinal:  Negative for abdominal pain, blood in stool, constipation, diarrhea, nausea and vomiting.  ?Genitourinary: Negative.   ?Musculoskeletal:  Positive for back  pain. Negative for falls, joint pain, myalgias and neck pain.  ?Skin: Negative.   ?Neurological: Negative.   ?Psychiatric/Behavioral:  Negative for depression. The patient is not nervous/anxious.   ?All other systems reviewed and are negative. ? ?   ?Objective:  ?  ?Physical Exam ?Vitals reviewed.  ?Constitutional:   ?   General: She is not in acute distress. ?   Appearance: Normal appearance. She is obese.  ?HENT:  ?   Head: Normocephalic.  ?Cardiovascular:  ?   Rate and Rhythm: Normal rate and regular rhythm.  ?  Pulses: Normal pulses.  ?   Heart sounds: Normal heart sounds.  ?   Comments: No obvious peripheral edema ?Pulmonary:  ?   Effort: Pulmonary effort is normal.  ?   Breath sounds: Normal breath sounds.  ?Musculoskeletal:     ?   General: Tenderness present. No swelling, deformity or signs of injury. Normal range of motion.  ?   Right lower leg: No edema.  ?   Left lower leg: No edema.  ?   Comments: Mild tenderness with palpation of diffuse right lower lumbar   ?Skin: ?   General: Skin is warm and dry.  ?   Capillary Refill: Capillary refill takes less than 2 seconds.  ?Neurological:  ?   General: No focal deficit present.  ?   Mental Status: She is alert and oriented to person, place, and time.  ?Psychiatric:     ?   Mood and Affect: Mood normal.     ?   Behavior: Behavior normal.     ?   Thought Content: Thought content normal.     ?   Judgment: Judgment normal.  ? ? ?BP 134/88 (BP Location: Right Arm, Patient Position: Sitting, Cuff Size: Normal)   Pulse 73   Temp (!) 97.2 ?F (36.2 ?C)   Ht 5' 1.5" (1.562 m)   Wt 210 lb 8 oz (95.5 kg)   SpO2 100%   BMI 39.13 kg/m?  ?Wt Readings from Last 3 Encounters:  ?03/09/22 210 lb 8 oz (95.5 kg)  ?12/21/21 210 lb 3.2 oz (95.3 kg)  ?11/12/21 212 lb 2 oz (96.2 kg)  ? ? ?Immunization History  ?Administered Date(s) Administered  ? Influenza,inj,Quad PF,6+ Mos 10/06/2015, 08/06/2016, 07/04/2017, 08/01/2018, 08/21/2019, 12/21/2021  ? PFIZER(Purple Top)SARS-COV-2  Vaccination 04/12/2020, 05/03/2020, 04/08/2021  ? ? ?Diabetic Foot Exam - Simple   ?No data filed ?  ? ? ?Lab Results  ?Component Value Date  ? TSH 1.360 02/16/2021  ? ?Lab Results  ?Component Value Date  ? WBC 4

## 2022-03-10 ENCOUNTER — Other Ambulatory Visit: Payer: Self-pay

## 2022-03-10 LAB — LIPID PANEL
Chol/HDL Ratio: 2.8 ratio (ref 0.0–4.4)
Cholesterol, Total: 170 mg/dL (ref 100–199)
HDL: 60 mg/dL (ref >39)
LDL Chol Calc (NIH): 96 mg/dL (ref 0–99)
Triglycerides: 73 mg/dL (ref 0–149)
VLDL Cholesterol Cal: 14 mg/dL (ref 5–40)

## 2022-03-10 LAB — COMPREHENSIVE METABOLIC PANEL
ALT: 19 IU/L (ref 0–32)
AST: 15 IU/L (ref 0–40)
Albumin/Globulin Ratio: 1.6 (ref 1.2–2.2)
Albumin: 4.4 g/dL (ref 3.8–4.8)
Alkaline Phosphatase: 66 IU/L (ref 44–121)
BUN/Creatinine Ratio: 17 (ref 12–28)
BUN: 12 mg/dL (ref 8–27)
Bilirubin Total: 0.3 mg/dL (ref 0.0–1.2)
CO2: 26 mmol/L (ref 20–29)
Calcium: 9.6 mg/dL (ref 8.7–10.3)
Chloride: 102 mmol/L (ref 96–106)
Creatinine, Ser: 0.69 mg/dL (ref 0.57–1.00)
Globulin, Total: 2.7 g/dL (ref 1.5–4.5)
Glucose: 107 mg/dL — ABNORMAL HIGH (ref 70–99)
Potassium: 3.8 mmol/L (ref 3.5–5.2)
Sodium: 139 mmol/L (ref 134–144)
Total Protein: 7.1 g/dL (ref 6.0–8.5)
eGFR: 98 mL/min/{1.73_m2} (ref >59)

## 2022-03-10 LAB — CBC WITH DIFFERENTIAL/PLATELET
Basophils Absolute: 0 10*3/uL (ref 0.0–0.2)
Basos: 1 %
EOS (ABSOLUTE): 0.1 10*3/uL (ref 0.0–0.4)
Eos: 3 %
Hematocrit: 37.8 % (ref 34.0–46.6)
Hemoglobin: 12.6 g/dL (ref 11.1–15.9)
Immature Grans (Abs): 0 10*3/uL (ref 0.0–0.1)
Immature Granulocytes: 0 %
Lymphocytes Absolute: 1.7 10*3/uL (ref 0.7–3.1)
Lymphs: 43 %
MCH: 32.1 pg (ref 26.6–33.0)
MCHC: 33.3 g/dL (ref 31.5–35.7)
MCV: 96 fL (ref 79–97)
Monocytes Absolute: 0.5 10*3/uL (ref 0.1–0.9)
Monocytes: 12 %
Neutrophils Absolute: 1.7 10*3/uL (ref 1.4–7.0)
Neutrophils: 41 %
Platelets: 327 10*3/uL (ref 150–450)
RBC: 3.92 x10E6/uL (ref 3.77–5.28)
RDW: 12.8 % (ref 11.7–15.4)
WBC: 4 10*3/uL (ref 3.4–10.8)

## 2022-04-06 ENCOUNTER — Other Ambulatory Visit: Payer: Self-pay | Admitting: Nurse Practitioner

## 2022-04-06 DIAGNOSIS — Z1231 Encounter for screening mammogram for malignant neoplasm of breast: Secondary | ICD-10-CM

## 2022-04-21 ENCOUNTER — Ambulatory Visit: Payer: Medicaid Other

## 2022-05-04 ENCOUNTER — Ambulatory Visit
Admission: RE | Admit: 2022-05-04 | Discharge: 2022-05-04 | Disposition: A | Discharge status: Home / Self Care | Payer: Medicaid Other | Source: Ambulatory Visit | Attending: Nurse Practitioner | Admitting: Nurse Practitioner

## 2022-05-04 DIAGNOSIS — Z1231 Encounter for screening mammogram for malignant neoplasm of breast: Secondary | ICD-10-CM

## 2022-05-06 ENCOUNTER — Other Ambulatory Visit: Payer: Self-pay

## 2022-05-20 ENCOUNTER — Other Ambulatory Visit: Payer: Self-pay

## 2022-05-21 ENCOUNTER — Other Ambulatory Visit: Payer: Self-pay

## 2022-05-24 ENCOUNTER — Other Ambulatory Visit: Payer: Self-pay

## 2022-06-02 ENCOUNTER — Encounter (HOSPITAL_COMMUNITY): Payer: Self-pay

## 2022-06-02 ENCOUNTER — Emergency Department (HOSPITAL_COMMUNITY)
Admission: EM | Admit: 2022-06-02 | Discharge: 2022-06-02 | Disposition: A | Discharge status: Home / Self Care | Payer: Medicare Other | Attending: Emergency Medicine | Admitting: Emergency Medicine

## 2022-06-02 ENCOUNTER — Other Ambulatory Visit: Payer: Self-pay

## 2022-06-02 ENCOUNTER — Emergency Department (HOSPITAL_COMMUNITY): Payer: Medicare Other

## 2022-06-02 DIAGNOSIS — R9389 Abnormal findings on diagnostic imaging of other specified body structures: Secondary | ICD-10-CM | POA: Insufficient documentation

## 2022-06-02 DIAGNOSIS — S0083XA Contusion of other part of head, initial encounter: Secondary | ICD-10-CM | POA: Diagnosis not present

## 2022-06-02 DIAGNOSIS — S0990XA Unspecified injury of head, initial encounter: Secondary | ICD-10-CM

## 2022-06-02 DIAGNOSIS — W182XXA Fall in (into) shower or empty bathtub, initial encounter: Secondary | ICD-10-CM | POA: Diagnosis not present

## 2022-06-02 DIAGNOSIS — W19XXXA Unspecified fall, initial encounter: Secondary | ICD-10-CM

## 2022-06-02 NOTE — Discharge Instructions (Addendum)
CT imaging did not show any acute findings related to your fall.  There was an incidental finding in your right upper lung that showed a 1.5 cm round density.  It is not clear at this point what this finding is. It is recommended that you have this followed up by your primary care doctor for further imaging. Please give them a call to schedule an appointment.

## 2022-06-02 NOTE — ED Triage Notes (Signed)
Pt states she slipped and fell while in the bath today and hit her head and face. Denies loc and no blood thinners. Pt AxOx4. Pt does have some bruising around right eye and a hematoma to right side of scalp. Pt does have a birthmark around the injury. Patient and family member is able to differentiate  between the bruising and birthmark.

## 2022-06-02 NOTE — ED Notes (Signed)
An After Visit Summary was printed and given to the patient. Discharge instructions given and no further questions at this time.  

## 2022-06-02 NOTE — ED Provider Triage Note (Signed)
Emergency Medicine Provider Triage Evaluation Note  Deanna Floyd , a 62 y.o. female  was evaluated in triage.  Pt complains of fall.  Slipped and fell while in the shower today.  She said the right side of her head very hard against the bathtub.  She states she has a severe headache.  She did not lose consciousness.  She says she has no visual changes, numbness, tingling, weakness, speech changes, confusion, chest pain, shortness of breath, abdominal pain.   Review of Systems  Positive:  Negative:   Physical Exam  BP (!) 135/91 (BP Location: Left Arm)   Pulse 71   Temp 98 F (36.7 C) (Oral)   Resp 18   Ht 5' 1.5" (1.562 m)   Wt 94.8 kg   SpO2 97%   BMI 38.85 kg/m  Gen:   Awake, no distress   Resp:  Normal effort  MSK:   Moves extremities without difficulty  Other:  She does have bruising and swelling around the right parietal region as well as bruising around her right eye.  There is no hemotympanums, battle sign. Does have a birthmark on the same side of her head, however family has been able to differentiate this.  They state that the bruising around her right eye is not her birthmark.  Vision is intact.  She has EOMs intact, pupils round equal and reactive, no facial droop, speech normal, 5 out of 5 motor strength in all 4 extremities  Medical Decision Making  Medically screening exam initiated at 4:01 PM.  Appropriate orders placed.  Deanna Floyd was informed that the remainder of the evaluation will be completed by another provider, this initial triage assessment does not replace that evaluation, and the importance of remaining in the ED until their evaluation is complete.     Deanna Floyd, [place] 06/02/22 1603

## 2022-06-02 NOTE — ED Provider Notes (Signed)
[facility] DEPT Provider Note   CSN: [id number] Arrival date & time: 06/02/22  1434     History PMH: HTN, Stroke, GERD Chief Complaint  Patient presents with   Fall    Deanna Floyd is a 61 y.o. female. Pt complains of fall.  Slipped and fell while in the shower today.  She said the right side of her head very hard against the bathtub.  She states she has a severe headache.  She did not lose consciousness.  She says she has no visual changes, numbness, tingling, weakness, speech changes, confusion, chest pain, shortness of breath, abdominal pain.   Fall Associated symptoms include headaches.       Home Medications Prior to Admission medications   Medication Sig Start Date End Date Taking? Authorizing Provider  acetaminophen (TYLENOL) 500 MG tablet Take 1 tablet (500 mg total) by mouth every 6 (six) hours as needed. Patient not taking: Reported on 03/09/2022 03/12/20   Azzie Glatter, FNP  amLODipine (NORVASC) 5 MG tablet TAKE 1 TABLET (5 MG TOTAL) BY MOUTH DAILY. 08/19/21 08/23/22  Vevelyn Francois, NP  cyclobenzaprine (FLEXERIL) 10 MG tablet Take 1 tablet (10 mg total) by mouth 3 (three) times daily as needed for muscle spasms. 08/19/21   Vevelyn Francois, NP  diclofenac Sodium (VOLTAREN) 1 % GEL Apply 4 g topically 4 (four) times daily. 12/21/21   Vevelyn Francois, NP  diphenhydrAMINE (BENADRYL) 2 % cream Apply topically 3 (three) times daily as needed for itching. 03/12/20   Azzie Glatter, FNP  gabapentin (NEURONTIN) 300 MG capsule Take 1 capsule (300 mg total) by mouth 2 (two) times daily. As needed. 08/19/21   Vevelyn Francois, NP  ibuprofen (ADVIL) 800 MG tablet Take 1 tablet (800 mg total) by mouth every 8 (eight) hours as needed. 12/21/21   Vevelyn Francois, NP  Latanoprostene Bunod (VYZULTA) 0.024 % SOLN Place 1 drop into both eyes every morning. 07/02/21 07/02/22  Vevelyn Francois, NP  lisinopril-hydrochlorothiazide (ZESTORETIC) 20-12.5 MG tablet  TAKE 1 TABLET BY MOUTH DAILY. 08/19/21 08/23/22  Vevelyn Francois, NP  Multiple Vitamin (MULTIVITAMIN WITH MINERALS) TABS tablet Take 1 tablet by mouth daily.    [provider]  Olopatadine HCl 0.2 % SOLN Place 1 drop into both eyes daily. One drop into each eye daily, as needed. 07/02/21 07/02/22  Vevelyn Francois, NP  omeprazole (PRILOSEC) 40 MG capsule Take 1 capsule (40 mg total) by mouth daily. 08/19/21 08/23/22  Vevelyn Francois, NP      Allergies    Hydrocortisone and Lyrica [pregabalin]    Review of Systems   Review of Systems  Skin:  Positive for color change.  Neurological:  Positive for headaches.  All other systems reviewed and are negative.   Physical Exam Updated Vital Signs BP (!) 135/91 (BP Location: Left Arm)   Pulse 71   Temp 98 F (36.7 C) (Oral)   Resp 18   Ht 5' 1.5" (1.562 m)   Wt 94.8 kg   SpO2 97%   BMI 38.85 kg/m  Physical Exam Vitals and nursing note reviewed.  Constitutional:      General: She is not in acute distress.    Appearance: Normal appearance. She is not ill-appearing, toxic-appearing or diaphoretic.  HENT:     Head: Normocephalic.     Comments: She does have bruising and swelling around the right parietal region as well as bruising around her right eye.  There is  no hemotympanums, battle sign. Does have a birthmark on the same side of her head, however family has been able to differentiate this.  They state that the bruising around her right eye is not her birthmark.      Nose: No nasal deformity.     Mouth/Throat:     Lips: Pink. No lesions.     Mouth: Mucous membranes are moist. No injury, lacerations, oral lesions or angioedema.     Pharynx: Oropharynx is clear. Uvula midline. No pharyngeal swelling, oropharyngeal exudate, posterior oropharyngeal erythema or uvula swelling.  Eyes:     General: Gaze aligned appropriately. No scleral icterus.       Right eye: No discharge.        Left eye: No discharge.     Conjunctiva/sclera:  Conjunctivae normal.     Right eye: Right conjunctiva is not injected. No exudate or hemorrhage.    Left eye: Left conjunctiva is not injected. No exudate or hemorrhage.    Pupils: Pupils are equal, round, and reactive to light.  Cardiovascular:     Rate and Rhythm: Normal rate and regular rhythm.     Pulses: Normal pulses.          Radial pulses are 2+ on the right side and 2+ on the left side.       Dorsalis pedis pulses are 2+ on the right side and 2+ on the left side.     Heart sounds: Normal heart sounds, S1 normal and S2 normal. Heart sounds not distant. No murmur heard.    No friction rub. No gallop. No S3 or S4 sounds.  Pulmonary:     Effort: Pulmonary effort is normal. No accessory muscle usage or respiratory distress.     Breath sounds: Normal breath sounds. No stridor. No wheezing, rhonchi or rales.  Chest:     Chest wall: No tenderness.  Abdominal:     General: Abdomen is flat. There is no distension.     Palpations: Abdomen is soft. There is no mass or pulsatile mass.     Tenderness: There is no abdominal tenderness. There is no guarding or rebound.  Musculoskeletal:     Right lower leg: No edema.     Left lower leg: No edema.     Comments: No c spine midline tenderness  Skin:    General: Skin is warm and dry.     Coloration: Skin is not jaundiced or pale.     Findings: No bruising, erythema, lesion or rash.  Neurological:     General: No focal deficit present.     Mental Status: She is alert and oriented to person, place, and time.     GCS: GCS eye subscore is 4. GCS verbal subscore is 5. GCS motor subscore is 6.     Comments: Vision is intact.  She has EOMs intact, pupils round equal and reactive, no facial droop, speech normal, 5 out of 5 motor strength in all 4 extremities. Sensation intact. Gait normal.   Psychiatric:        Mood and Affect: Mood normal.        Behavior: Behavior normal. Behavior is cooperative.     ED Results / Procedures / Treatments    Labs (all labs ordered are listed, but only abnormal results are displayed) Labs Reviewed - No data to display  EKG None  Radiology CT Maxillofacial WO CM  Result Date: 06/02/2022 CLINICAL DATA:  Head trauma, skull fracture or hematoma (Age 96-64y); Facial trauma,  blunt EXAM: CT HEAD WITHOUT CONTRAST CT MAXILLOFACIAL WITHOUT CONTRAST TECHNIQUE: Multidetector CT imaging of the head and maxillofacial structures were performed using the standard protocol without intravenous contrast. Multiplanar CT image reconstructions of the maxillofacial structures were also generated. RADIATION DOSE REDUCTION: This exam was performed according to the departmental dose-optimization program which includes automated exposure control, adjustment of the mA and/or kV according to patient size and/or use of iterative reconstruction technique. COMPARISON:  None Available. FINDINGS: CT HEAD FINDINGS Brain: No evidence of acute infarction, hemorrhage, hydrocephalus, extra-axial collection or mass lesion/mass effect. Vascular: No hyperdense vessel identified. Skull: No acute fracture. Other: No mastoid effusions. CT MAXILLOFACIAL FINDINGS Osseous: No fracture or mandibular dislocation. No destructive process. Orbits: Negative. No traumatic or inflammatory finding. Sinuses: Minimal paranasal sinus mucosal thickening. No air-fluid levels. Soft tissues: Negative. IMPRESSION: No evidence of acute intracranial abnormality or facial fracture. Electronically Signed   By: Margaretha Sheffield M.D.   On: 06/02/2022 16:26   CT Head Wo Contrast  Result Date: 06/02/2022 CLINICAL DATA:  Head trauma, skull fracture or hematoma (Age 67-64y); Facial trauma, blunt EXAM: CT HEAD WITHOUT CONTRAST CT MAXILLOFACIAL WITHOUT CONTRAST TECHNIQUE: Multidetector CT imaging of the head and maxillofacial structures were performed using the standard protocol without intravenous contrast. Multiplanar CT image reconstructions of the maxillofacial structures  were also generated. RADIATION DOSE REDUCTION: This exam was performed according to the departmental dose-optimization program which includes automated exposure control, adjustment of the mA and/or kV according to patient size and/or use of iterative reconstruction technique. COMPARISON:  None Available. FINDINGS: CT HEAD FINDINGS Brain: No evidence of acute infarction, hemorrhage, hydrocephalus, extra-axial collection or mass lesion/mass effect. Vascular: No hyperdense vessel identified. Skull: No acute fracture. Other: No mastoid effusions. CT MAXILLOFACIAL FINDINGS Osseous: No fracture or mandibular dislocation. No destructive process. Orbits: Negative. No traumatic or inflammatory finding. Sinuses: Minimal paranasal sinus mucosal thickening. No air-fluid levels. Soft tissues: Negative. IMPRESSION: No evidence of acute intracranial abnormality or facial fracture. Electronically Signed   By: Margaretha Sheffield M.D.   On: 06/02/2022 16:26   CT Cervical Spine Wo Contrast  Result Date: 06/02/2022 CLINICAL DATA:  Neck trauma, dangerous injury mechanism (Age 54-64y) patient slipped and fell in the bathtub hitting her head and face EXAM: CT CERVICAL SPINE WITHOUT CONTRAST TECHNIQUE: Multidetector CT imaging of the cervical spine was performed without intravenous contrast. Multiplanar CT image reconstructions were also generated. RADIATION DOSE REDUCTION: This exam was performed according to the departmental dose-optimization program which includes automated exposure control, adjustment of the mA and/or kV according to patient size and/or use of iterative reconstruction technique. COMPARISON:  None Available. FINDINGS: Alignment: There is absence of the normal lordotic curvature of the C-spine likely due to position, spasm or pain. Skull base and vertebrae: No acute fracture. No primary bone lesion or focal pathologic process. Mild to moderate cervical spondylosis with prominent anterior endplate osteophytes. Soft  tissues and spinal canal: No prevertebral fluid or swelling. No visible canal hematoma. Disc levels:  Mild narrowing of the disc space at C5-C6. Upper chest: There is an apparent 1.5 cm round density in the anterior aspect of the right upper lobe (image 74/1 and sent for/4). Other: None. IMPRESSION: 1.  No fracture, subluxation or dislocation. 2. Mild-to-moderate cervical spondylosis with prominent anterior endplate osteophytes. 3. There is an apparent 1.5 cm round density in the anterior upper lobe of the right lung and is difficult to discern if this is due to volume averaging effect from the subjacent vasculature or an  actual nodular density. Suggest noncontrast CT of the chest for further evaluation. Electronically Signed   By: Frazier Richards M.D.   On: 06/02/2022 16:25    Procedures Procedures    Medications Ordered in ED Medications - No data to display  ED Course/ Medical Decision Making/ A&P                           Medical Decision Making Amount and/or Complexity of Data Reviewed Radiology: ordered.   Presents after a mechanical fall while in the bathtub today.  She did hit her head and had no loss of consciousness.  She has neuro logically intact.  She does have bruising around her right eye as well as bruising along her right parietal and temporal region with associated swelling.  With concern for possible skull fracture, CT head, maxillofacial, and cervical spine were ordered.  These images were fortunately normal.  There was an incidental finding in her right upper lobe that is approximately 1.5 cm and round density.  Recommend CT scan to further evaluate.  Seeing as patient is having no chest pain or shortness of breath or cough symptoms, I do not think that we need to get the CT image today.  I did discuss this incidental finding with the patient and recommended that she have this reevaluated with her primary care doctor and get a CT scan done outpatient to evaluate this further. I  recommended supportive treatment for patient's headaches and swelling.  She needs no further imaging or work-up today.  I recommend follow-up with PCP.    Final Clinical Impression(s) / ED Diagnoses Final diagnoses:  Fall, initial encounter  Injury of head, initial encounter  Abnormal finding on CT scan    Rx / DC Orders ED Discharge Orders     None         Adolphus Birchwood, PA-C 06/02/22 Randa Lynn, DO 06/02/22 2040

## 2022-06-17 ENCOUNTER — Ambulatory Visit (INDEPENDENT_AMBULATORY_CARE_PROVIDER_SITE_OTHER): Payer: Medicare Other | Admitting: Nurse Practitioner

## 2022-06-17 ENCOUNTER — Other Ambulatory Visit: Payer: Self-pay

## 2022-06-17 ENCOUNTER — Ambulatory Visit (HOSPITAL_COMMUNITY)
Admission: RE | Admit: 2022-06-17 | Discharge: 2022-06-17 | Disposition: A | Discharge status: Home / Self Care | Payer: Medicare Other | Source: Ambulatory Visit | Attending: Nurse Practitioner | Admitting: Nurse Practitioner

## 2022-06-17 ENCOUNTER — Encounter: Payer: Self-pay | Admitting: Nurse Practitioner

## 2022-06-17 ENCOUNTER — Other Ambulatory Visit: Payer: Self-pay | Admitting: Nurse Practitioner

## 2022-06-17 VITALS — BP 131/97 | HR 85 | Temp 98.4°F | Ht 61.5 in | Wt 206.8 lb

## 2022-06-17 DIAGNOSIS — W19XXXD Unspecified fall, subsequent encounter: Secondary | ICD-10-CM

## 2022-06-17 DIAGNOSIS — M25562 Pain in left knee: Secondary | ICD-10-CM

## 2022-06-17 DIAGNOSIS — M25532 Pain in left wrist: Secondary | ICD-10-CM

## 2022-06-17 DIAGNOSIS — T148XXA Other injury of unspecified body region, initial encounter: Secondary | ICD-10-CM

## 2022-06-17 DIAGNOSIS — M25531 Pain in right wrist: Secondary | ICD-10-CM | POA: Diagnosis not present

## 2022-06-17 DIAGNOSIS — R911 Solitary pulmonary nodule: Secondary | ICD-10-CM

## 2022-06-17 MED ORDER — IBUPROFEN 800 MG PO TABS
800.0000 mg | ORAL_TABLET | Freq: Three times a day (TID) | ORAL | 6 refills | Status: DC | PRN
  Filled 2022-06-17: qty 30, 10d supply, fill #0
  Filled 2022-08-24: qty 30, 10d supply, fill #1
  Filled 2022-11-23: qty 30, 10d supply, fill #2
  Filled 2022-12-29: qty 30, 10d supply, fill #3

## 2022-06-17 MED ORDER — MUPIROCIN 2 % EX OINT
1.0000 | TOPICAL_OINTMENT | Freq: Two times a day (BID) | CUTANEOUS | 0 refills | Status: AC
  Filled 2022-06-17: qty 22, 11d supply, fill #0

## 2022-06-17 NOTE — Assessment & Plan Note (Signed)
-   CT Chest Wo Contrast; Future  2. Bilateral wrist pain  - ibuprofen (ADVIL) 800 MG tablet; Take 1 tablet (800 mg total) by mouth every 8 (eight) hours as needed.  Dispense: 30 tablet; Refill: 6  3. Acute pain of left knee  - ibuprofen (ADVIL) 800 MG tablet; Take 1 tablet (800 mg total) by mouth every 8 (eight) hours as needed.  Dispense: 30 tablet; Refill: 6 - DG Knee 1-2 Views Right; Future  4. Fall, subsequent encounter  - DG Knee 1-2 Views Right; Future  5. Skin abrasion  - mupirocin ointment (BACTROBAN) 2 %; Apply 1 Application topically 2 (two) times daily.  Dispense: 22 g; Refill: 0    Follow up:  Follow up in 3 months or sooner if needed

## 2022-06-17 NOTE — Progress Notes (Signed)
$'@Patient'Z$  ID: Deanna Floyd, female    DOB: 10/08/1961, 61 y.o.   MRN: [id number]  Chief Complaint  Patient presents with   Hospitalization Follow-up    Pt is here hospital follow up visit. Pt stated she was told there was something sitting on a her throat and she needed to follow up with PCP. Pt states both wrist hurts since the fall 2 week's ago    Referring provider: Fenton Foy, NP   HPI  Deanna Floyd 61 y.o. female  has a past medical history of GERD (gastroesophageal reflux disease), Hypertension, and Stroke (2007).   Patient was seen in the ED on 06/02/22.  She was seen for a fall.  Bruising to her right thigh and head.  She fell in the bathtub and hit her head on the side of the bathtub.  Patient did have several CT scans ordered which were all negative.  There was an incidental finding of a lung nodule on one of the scans.  Was told to follow up with PCP for lung nodule found on xray - recommended CT for further evaluation.  Patient states that her bruising to her face and hand is much improved.  Unfortunately patient has fallen again on 06/06/2022.  She states that she scraped her left knee.  She would like appointment called in for this.  She states that her left knee does still hurt and has been swelling.  We will order a x-ray for this knee.  Patient also states that bilateral wrist has been hurting since last fall.  She does have good range of motion and no swelling is noted to bilateral wrists.  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 Denies f/c/s, n/v/d, hemoptysis, PND, leg swelling Denies chest pain or edema    Allergies  Allergen Reactions   Hydrocortisone Hives   Lyrica [Pregabalin] Rash    Immunization History  Administered Date(s) Administered   Influenza,inj,Quad PF,6+ Mos 10/06/2015, 08/06/2016, 07/04/2017, 08/01/2018, 08/21/2019, 12/21/2021   PFIZER(Purple Top)SARS-COV-2 Vaccination 04/12/2020, 05/03/2020, 04/08/2021     Past Medical History:  Diagnosis Date   GERD (gastroesophageal reflux disease)    Hypertension    Stroke ([place]) 2007    Tobacco History: Social History   Tobacco Use  Smoking Status Former   Packs/day: 0.25   Years: 10.00   Total pack years: 2.50   Types: Cigarettes   Quit date: 2012   Years since quitting: 11.6  Smokeless Tobacco Never   Counseling given: Not Answered   Outpatient Encounter Medications as of 06/17/2022  Medication Sig   acetaminophen (TYLENOL) 500 MG tablet Take 1 tablet (500 mg total) by mouth every 6 (six) hours as needed.   amLODipine (NORVASC) 5 MG tablet TAKE 1 TABLET (5 MG TOTAL) BY MOUTH DAILY.   cyclobenzaprine (FLEXERIL) 10 MG tablet Take 1 tablet (10 mg total) by mouth 3 (three) times daily as needed for muscle spasms.   diclofenac Sodium (VOLTAREN) 1 % GEL Apply 4 g topically 4 (four) times daily.   diphenhydrAMINE (BENADRYL) 2 % cream Apply topically 3 (three) times daily as needed for itching.   gabapentin (NEURONTIN) 300 MG capsule Take 1 capsule (300 mg total) by mouth 2 (two) times daily. As needed.   Latanoprostene Bunod (VYZULTA) 0.024 % SOLN Place 1 drop into both eyes every morning.   lisinopril-hydrochlorothiazide (ZESTORETIC) 20-12.5 MG tablet TAKE 1 TABLET BY MOUTH DAILY.   Multiple Vitamin (MULTIVITAMIN WITH MINERALS) TABS tablet Take 1  tablet by mouth daily.   mupirocin ointment (BACTROBAN) 2 % Apply 1 Application topically 2 (two) times daily.   Olopatadine HCl 0.2 % SOLN Place 1 drop into both eyes daily. One drop into each eye daily, as needed.   omeprazole (PRILOSEC) 40 MG capsule Take 1 capsule (40 mg total) by mouth daily.   [DISCONTINUED] ibuprofen (ADVIL) 800 MG tablet Take 1 tablet (800 mg total) by mouth every 8 (eight) hours as needed.   ibuprofen (ADVIL) 800 MG tablet Take 1 tablet (800 mg total) by mouth every 8 (eight) hours as needed.   No facility-administered encounter medications on file as of 06/17/2022.      Review of Systems  Review of Systems  Constitutional: Negative.   HENT: Negative.    Cardiovascular: Negative.   Gastrointestinal: Negative.   Musculoskeletal:        Bilateral wrist pain and left knee pain  Skin:        Abrasion to left knee.   Allergic/Immunologic: Negative.   Neurological: Negative.   Psychiatric/Behavioral: Negative.         Physical Exam  BP (!) 131/97 (BP Location: Left Arm, Patient Position: Sitting, Cuff Size: Large)   Pulse 85   Temp 98.4 F (36.9 C)   Ht 5' 1.5" (1.562 m)   Wt 206 lb 12.8 oz (93.8 kg)   SpO2 100%   BMI 38.44 kg/m   Wt Readings from Last 5 Encounters:  06/17/22 206 lb 12.8 oz (93.8 kg)  06/02/22 209 lb (94.8 kg)  03/09/22 210 lb 8 oz (95.5 kg)  12/21/21 210 lb 3.2 oz (95.3 kg)  11/12/21 212 lb 2 oz (96.2 kg)     Physical Exam Vitals and nursing note reviewed.  Constitutional:      General: She is not in acute distress.    Appearance: She is well-developed.  Cardiovascular:     Rate and Rhythm: Normal rate and regular rhythm.  Pulmonary:     Effort: Pulmonary effort is normal.     Breath sounds: Normal breath sounds.  Skin:    Findings: Abrasion present.       Neurological:     Mental Status: She is alert and oriented to person, place, and time.      Lab Results:  CBC    Component Value Date/Time   WBC 4.0 03/09/2022 1405   WBC 4.9 06/07/2016 1600   RBC 3.92 03/09/2022 1405   RBC 3.54 (L) 06/07/2016 1600   HGB 12.6 03/09/2022 1405   HCT 37.8 03/09/2022 1405   PLT 327 03/09/2022 1405   MCV 96 03/09/2022 1405   MCH 32.1 03/09/2022 1405   MCH 31.6 06/07/2016 1600   MCHC 33.3 03/09/2022 1405   MCHC 32.8 06/07/2016 1600   RDW 12.8 03/09/2022 1405   LYMPHSABS 1.7 03/09/2022 1405   MONOABS 441 06/07/2016 1600   EOSABS 0.1 03/09/2022 1405   BASOSABS 0.0 03/09/2022 1405    BMET    Component Value Date/Time   NA 139 03/09/2022 1405   K 3.8 03/09/2022 1405   CL 102 03/09/2022 1405   CO2 26  03/09/2022 1405   GLUCOSE 107 (H) 03/09/2022 1405   GLUCOSE 106 (H) 11/12/2021 1558   BUN 12 03/09/2022 1405   CREATININE 0.69 03/09/2022 1405   CREATININE 0.82 11/19/2016 1105   CALCIUM 9.6 03/09/2022 1405   GFRNONAA 87 03/12/2020 1420   GFRNONAA 80 11/19/2016 1105   GFRAA 100 03/12/2020 1420   GFRAA >89 11/19/2016 1105  BNP No results found for: "BNP"  ProBNP No results found for: "PROBNP"  Imaging: CT Maxillofacial WO CM  Result Date: 06/02/2022 CLINICAL DATA:  Head trauma, skull fracture or hematoma (Age 36-64y); Facial trauma, blunt EXAM: CT HEAD WITHOUT CONTRAST CT MAXILLOFACIAL WITHOUT CONTRAST TECHNIQUE: Multidetector CT imaging of the head and maxillofacial structures were performed using the standard protocol without intravenous contrast. Multiplanar CT image reconstructions of the maxillofacial structures were also generated. RADIATION DOSE REDUCTION: This exam was performed according to the departmental dose-optimization program which includes automated exposure control, adjustment of the mA and/or kV according to patient size and/or use of iterative reconstruction technique. COMPARISON:  None Available. FINDINGS: CT HEAD FINDINGS Brain: No evidence of acute infarction, hemorrhage, hydrocephalus, extra-axial collection or mass lesion/mass effect. Vascular: No hyperdense vessel identified. Skull: No acute fracture. Other: No mastoid effusions. CT MAXILLOFACIAL FINDINGS Osseous: No fracture or mandibular dislocation. No destructive process. Orbits: Negative. No traumatic or inflammatory finding. Sinuses: Minimal paranasal sinus mucosal thickening. No air-fluid levels. Soft tissues: Negative. IMPRESSION: No evidence of acute intracranial abnormality or facial fracture. Electronically Signed   By: Margaretha Sheffield M.D.   On: 06/02/2022 16:26   CT Head Wo Contrast  Result Date: 06/02/2022 CLINICAL DATA:  Head trauma, skull fracture or hematoma (Age 32-64y); Facial trauma, blunt  EXAM: CT HEAD WITHOUT CONTRAST CT MAXILLOFACIAL WITHOUT CONTRAST TECHNIQUE: Multidetector CT imaging of the head and maxillofacial structures were performed using the standard protocol without intravenous contrast. Multiplanar CT image reconstructions of the maxillofacial structures were also generated. RADIATION DOSE REDUCTION: This exam was performed according to the departmental dose-optimization program which includes automated exposure control, adjustment of the mA and/or kV according to patient size and/or use of iterative reconstruction technique. COMPARISON:  None Available. FINDINGS: CT HEAD FINDINGS Brain: No evidence of acute infarction, hemorrhage, hydrocephalus, extra-axial collection or mass lesion/mass effect. Vascular: No hyperdense vessel identified. Skull: No acute fracture. Other: No mastoid effusions. CT MAXILLOFACIAL FINDINGS Osseous: No fracture or mandibular dislocation. No destructive process. Orbits: Negative. No traumatic or inflammatory finding. Sinuses: Minimal paranasal sinus mucosal thickening. No air-fluid levels. Soft tissues: Negative. IMPRESSION: No evidence of acute intracranial abnormality or facial fracture. Electronically Signed   By: Margaretha Sheffield M.D.   On: 06/02/2022 16:26   CT Cervical Spine Wo Contrast  Result Date: 06/02/2022 CLINICAL DATA:  Neck trauma, dangerous injury mechanism (Age 41-64y) patient slipped and fell in the bathtub hitting her head and face EXAM: CT CERVICAL SPINE WITHOUT CONTRAST TECHNIQUE: Multidetector CT imaging of the cervical spine was performed without intravenous contrast. Multiplanar CT image reconstructions were also generated. RADIATION DOSE REDUCTION: This exam was performed according to the departmental dose-optimization program which includes automated exposure control, adjustment of the mA and/or kV according to patient size and/or use of iterative reconstruction technique. COMPARISON:  None Available. FINDINGS: Alignment: There is  absence of the normal lordotic curvature of the C-spine likely due to position, spasm or pain. Skull base and vertebrae: No acute fracture. No primary bone lesion or focal pathologic process. Mild to moderate cervical spondylosis with prominent anterior endplate osteophytes. Soft tissues and spinal canal: No prevertebral fluid or swelling. No visible canal hematoma. Disc levels:  Mild narrowing of the disc space at C5-C6. Upper chest: There is an apparent 1.5 cm round density in the anterior aspect of the right upper lobe (image 74/1 and sent for/4). Other: None. IMPRESSION: 1.  No fracture, subluxation or dislocation. 2. Mild-to-moderate cervical spondylosis with prominent anterior endplate osteophytes. 3.  There is an apparent 1.5 cm round density in the anterior upper lobe of the right lung and is difficult to discern if this is due to volume averaging effect from the subjacent vasculature or an actual nodular density. Suggest noncontrast CT of the chest for further evaluation. Electronically Signed   By: Frazier Richards M.D.   On: 06/02/2022 16:25     Assessment & Plan:   Solitary pulmonary nodule - CT Chest Wo Contrast; Future  2. Bilateral wrist pain  - ibuprofen (ADVIL) 800 MG tablet; Take 1 tablet (800 mg total) by mouth every 8 (eight) hours as needed.  Dispense: 30 tablet; Refill: 6  3. Acute pain of left knee  - ibuprofen (ADVIL) 800 MG tablet; Take 1 tablet (800 mg total) by mouth every 8 (eight) hours as needed.  Dispense: 30 tablet; Refill: 6 - DG Knee 1-2 Views Right; Future  4. Fall, subsequent encounter  - DG Knee 1-2 Views Right; Future  5. Skin abrasion  - mupirocin ointment (BACTROBAN) 2 %; Apply 1 Application topically 2 (two) times daily.  Dispense: 22 g; Refill: 0    Follow up:  Follow up in 3 months or sooner if needed  Patient Instructions  1. Solitary pulmonary nodule  - CT Chest Wo Contrast; Future  2. Bilateral wrist pain  - ibuprofen (ADVIL) 800 MG  tablet; Take 1 tablet (800 mg total) by mouth every 8 (eight) hours as needed.  Dispense: 30 tablet; Refill: 6  3. Acute pain of left knee  - ibuprofen (ADVIL) 800 MG tablet; Take 1 tablet (800 mg total) by mouth every 8 (eight) hours as needed.  Dispense: 30 tablet; Refill: 6 - DG Knee 1-2 Views Right; Future  4. Fall, subsequent encounter  - DG Knee 1-2 Views Right; Future  5. Skin abrasion  - mupirocin ointment (BACTROBAN) 2 %; Apply 1 Application topically 2 (two) times daily.  Dispense: 22 g; Refill: 0    Follow up:  Follow up in 3 months or sooner if needed   Acute Knee Pain, Adult Acute knee pain is sudden and may be caused by damage, swelling, or irritation of the muscles and tissues that support the knee. Pain may result from: A fall. An injury to the knee from twisting motions. A hit to the knee. Infection. Acute knee pain may go away on its own with time and rest. If it does not, your health care provider may order tests to find the cause of the pain. These may include: Imaging tests, such as an X-ray, MRI, CT scan, or ultrasound. Joint aspiration. In this test, fluid is removed from the knee and evaluated. Arthroscopy. In this test, a lighted tube is inserted into the knee and an image is projected onto a TV screen. Biopsy. In this test, a sample of tissue is removed from the body and studied under a microscope. Follow these instructions at home: If you have a knee sleeve or brace:  Wear the knee sleeve or brace as told by your health care provider. Remove it only as told by your health care provider. Loosen it if your toes tingle, become numb, or turn cold and blue. Keep it clean. If the knee sleeve or brace is not waterproof: Do not let it get wet. Cover it with a watertight covering when you take a bath or shower. Activity Rest your knee. Do not do things that cause pain or make pain worse. Avoid high-impact activities or exercises, such as running, jumping  rope, or doing jumping jacks. Work with a physical therapist to make a safe exercise program, as recommended by your health care provider. Do exercises as told by your physical therapist. Managing pain, stiffness, and swelling  If directed, put ice on the affected knee. To do this: If you have a removable knee sleeve or brace, remove it as told by your health care provider. Put ice in a plastic bag. Place a towel between your skin and the bag. Leave the ice on for 20 minutes, 2-3 times a day. Remove the ice if your skin turns bright red. This is very important. If you cannot feel pain, heat, or cold, you have a greater risk of damage to the area. If directed, use an elastic bandage to put pressure (compression) on your injured knee. This may control swelling, give support, and help with discomfort. Raise (elevate) your knee above the level of your heart while you are sitting or lying down. Sleep with a pillow under your knee. General instructions Take over-the-counter and prescription medicines only as told by your health care provider. Do not use any products that contain nicotine or tobacco, such as cigarettes, e-cigarettes, and chewing tobacco. If you need help quitting, ask your health care provider. If you are overweight, work with your health care provider and a dietitian to set a weight-loss goal that is healthy and reasonable for you. Extra weight can put pressure on your knee. Pay attention to any changes in your symptoms. Keep all follow-up visits. This is important. Contact a health care provider if: Your knee pain continues, changes, or gets worse. You have a fever along with knee pain. Your knee feels warm to the touch or is red. Your knee buckles or locks up. Get help right away if: Your knee swells, and the swelling becomes worse. You cannot move your knee. You have severe pain in your knee that cannot be managed with pain medicine. Summary Acute knee pain can be caused by  a fall, an injury, an infection, or damage, swelling, or irritation of the tissues that support your knee. Your health care provider may perform tests to find out the cause of the pain. Pay attention to any changes in your symptoms. Relieve your pain with rest, medicines, light activity, and the use of ice. Get help right away if your knee swells, you cannot move your knee, or you have severe pain that cannot be managed with medicine. This information is not intended to replace advice given to you by your health care provider. Make sure you discuss any questions you have with your health care provider. Document Revised: 04/09/2020 Document Reviewed: 04/09/2020 Elsevier Patient Education  [place].   It is important to avoid accidents which may result in broken bones.  Here are a few ideas on how to make your home safer so you will be less likely to trip or fall.  Use nonskid mats or non slip strips in your shower or tub, on your bathroom floor and around sinks.  If you know that you have spilled water, wipe it up! In the bathroom, it is important to have properly installed grab bars on the walls or on the edge of the tub.  Towel racks are NOT strong enough for you to hold onto or to pull on for support. Stairs and hallways should have enough light.  Add lamps or night lights if you need ore light. It is good to have handrails on both sides of the stairs if  possible.  Always fix broken handrails right away. It is important to see the edges of steps.  Paint the edges of outdoor steps white so you can see them better.  Put colored tape on the edge of inside steps. Throw-rugs are dangerous because they can slide.  Removing the rugs is the best idea, but if they must stay, add adhesive carpet tape to prevent slipping. Do not keep things on stairs or in the halls.  Remove small furniture that blocks the halls as it may cause you to trip.  Keep telephone and electrical cords out of the way where  you walk. Always were sturdy, rubber-soled shoes for good support.  Never wear just socks, especially on the stairs.  Socks may cause you to slip or fall.  Do not wear full-length housecoats as you can easily trip on the bottom.  Place the things you use the most on the shelves that are the easiest to reach.  If you use a stepstool, make sure it is in good condition.  If you feel unsteady, DO NOT climb, ask for help. If a health professional advises you to use a cane or walker, do not be ashamed.  These items can keep you from falling and breaking your bones.    Fenton Foy, NP 06/17/2022

## 2022-06-17 NOTE — Patient Instructions (Addendum)
1. Solitary pulmonary nodule  - CT Chest Wo Contrast; Future  2. Bilateral wrist pain  - ibuprofen (ADVIL) 800 MG tablet; Take 1 tablet (800 mg total) by mouth every 8 (eight) hours as needed.  Dispense: 30 tablet; Refill: 6  3. Acute pain of left knee  - ibuprofen (ADVIL) 800 MG tablet; Take 1 tablet (800 mg total) by mouth every 8 (eight) hours as needed.  Dispense: 30 tablet; Refill: 6 - DG Knee 1-2 Views Right; Future  4. Fall, subsequent encounter  - DG Knee 1-2 Views Right; Future  5. Skin abrasion  - mupirocin ointment (BACTROBAN) 2 %; Apply 1 Application topically 2 (two) times daily.  Dispense: 22 g; Refill: 0    Follow up:  Follow up in 3 months or sooner if needed   Acute Knee Pain, Adult Acute knee pain is sudden and may be caused by damage, swelling, or irritation of the muscles and tissues that support the knee. Pain may result from: A fall. An injury to the knee from twisting motions. A hit to the knee. Infection. Acute knee pain may go away on its own with time and rest. If it does not, your health care provider may order tests to find the cause of the pain. These may include: Imaging tests, such as an X-ray, MRI, CT scan, or ultrasound. Joint aspiration. In this test, fluid is removed from the knee and evaluated. Arthroscopy. In this test, a lighted tube is inserted into the knee and an image is projected onto a TV screen. Biopsy. In this test, a sample of tissue is removed from the body and studied under a microscope. Follow these instructions at home: If you have a knee sleeve or brace:  Wear the knee sleeve or brace as told by your health care provider. Remove it only as told by your health care provider. Loosen it if your toes tingle, become numb, or turn cold and blue. Keep it clean. If the knee sleeve or brace is not waterproof: Do not let it get wet. Cover it with a watertight covering when you take a bath or shower. Activity Rest your  knee. Do not do things that cause pain or make pain worse. Avoid high-impact activities or exercises, such as running, jumping rope, or doing jumping jacks. Work with a physical therapist to make a safe exercise program, as recommended by your health care provider. Do exercises as told by your physical therapist. Managing pain, stiffness, and swelling  If directed, put ice on the affected knee. To do this: If you have a removable knee sleeve or brace, remove it as told by your health care provider. Put ice in a plastic bag. Place a towel between your skin and the bag. Leave the ice on for 20 minutes, 2-3 times a day. Remove the ice if your skin turns bright red. This is very important. If you cannot feel pain, heat, or cold, you have a greater risk of damage to the area. If directed, use an elastic bandage to put pressure (compression) on your injured knee. This may control swelling, give support, and help with discomfort. Raise (elevate) your knee above the level of your heart while you are sitting or lying down. Sleep with a pillow under your knee. General instructions Take over-the-counter and prescription medicines only as told by your health care provider. Do not use any products that contain nicotine or tobacco, such as cigarettes, e-cigarettes, and chewing tobacco. If you need help quitting, ask your health  care provider. If you are overweight, work with your health care provider and a dietitian to set a weight-loss goal that is healthy and reasonable for you. Extra weight can put pressure on your knee. Pay attention to any changes in your symptoms. Keep all follow-up visits. This is important. Contact a health care provider if: Your knee pain continues, changes, or gets worse. You have a fever along with knee pain. Your knee feels warm to the touch or is red. Your knee buckles or locks up. Get help right away if: Your knee swells, and the swelling becomes worse. You cannot move  your knee. You have severe pain in your knee that cannot be managed with pain medicine. Summary Acute knee pain can be caused by a fall, an injury, an infection, or damage, swelling, or irritation of the tissues that support your knee. Your health care provider may perform tests to find out the cause of the pain. Pay attention to any changes in your symptoms. Relieve your pain with rest, medicines, light activity, and the use of ice. Get help right away if your knee swells, you cannot move your knee, or you have severe pain that cannot be managed with medicine. This information is not intended to replace advice given to you by your health care provider. Make sure you discuss any questions you have with your health care provider. Document Revised: 04/09/2020 Document Reviewed: 04/09/2020 Elsevier Patient Education  [place].   It is important to avoid accidents which may result in broken bones.  Here are a few ideas on how to make your home safer so you will be less likely to trip or fall.  Use nonskid mats or non slip strips in your shower or tub, on your bathroom floor and around sinks.  If you know that you have spilled water, wipe it up! In the bathroom, it is important to have properly installed grab bars on the walls or on the edge of the tub.  Towel racks are NOT strong enough for you to hold onto or to pull on for support. Stairs and hallways should have enough light.  Add lamps or night lights if you need ore light. It is good to have handrails on both sides of the stairs if possible.  Always fix broken handrails right away. It is important to see the edges of steps.  Paint the edges of outdoor steps white so you can see them better.  Put colored tape on the edge of inside steps. Throw-rugs are dangerous because they can slide.  Removing the rugs is the best idea, but if they must stay, add adhesive carpet tape to prevent slipping. Do not keep things on stairs or in the halls.   Remove small furniture that blocks the halls as it may cause you to trip.  Keep telephone and electrical cords out of the way where you walk. Always were sturdy, rubber-soled shoes for good support.  Never wear just socks, especially on the stairs.  Socks may cause you to slip or fall.  Do not wear full-length housecoats as you can easily trip on the bottom.  Place the things you use the most on the shelves that are the easiest to reach.  If you use a stepstool, make sure it is in good condition.  If you feel unsteady, DO NOT climb, ask for help. If a health professional advises you to use a cane or walker, do not be ashamed.  These items can keep you from  falling and breaking your bones.

## 2022-06-23 ENCOUNTER — Other Ambulatory Visit: Payer: Self-pay | Admitting: Nurse Practitioner

## 2022-06-23 DIAGNOSIS — W19XXXD Unspecified fall, subsequent encounter: Secondary | ICD-10-CM

## 2022-06-23 DIAGNOSIS — M25561 Pain in right knee: Secondary | ICD-10-CM

## 2022-06-23 DIAGNOSIS — R936 Abnormal findings on diagnostic imaging of limbs: Secondary | ICD-10-CM

## 2022-07-01 ENCOUNTER — Other Ambulatory Visit: Payer: Self-pay

## 2022-07-01 ENCOUNTER — Ambulatory Visit (INDEPENDENT_AMBULATORY_CARE_PROVIDER_SITE_OTHER): Payer: Medicare Other

## 2022-07-01 ENCOUNTER — Ambulatory Visit (INDEPENDENT_AMBULATORY_CARE_PROVIDER_SITE_OTHER): Payer: Medicare Other | Admitting: Orthopaedic Surgery

## 2022-07-01 DIAGNOSIS — M25552 Pain in left hip: Secondary | ICD-10-CM | POA: Diagnosis not present

## 2022-07-01 DIAGNOSIS — M25561 Pain in right knee: Secondary | ICD-10-CM

## 2022-07-01 DIAGNOSIS — G8929 Other chronic pain: Secondary | ICD-10-CM

## 2022-07-01 MED ORDER — IBUPROFEN 800 MG PO TABS
800.0000 mg | ORAL_TABLET | Freq: Three times a day (TID) | ORAL | 2 refills | Status: DC | PRN
  Filled 2022-07-01 – 2022-07-08 (×2): qty 30, 10d supply, fill #0

## 2022-07-01 NOTE — Progress Notes (Signed)
Office Visit Note   Patient: Deanna Floyd           Date of Birth: Oct 10, 1961           MRN: [id number] Visit Date: 07/01/2022              Requested by: Fenton Foy, NP [phone] [address] [place],  [place] [address] PCP: Fenton Foy, NP   Assessment & Plan: Visit Diagnoses:  1. Chronic pain of right knee   2. Pain in left hip     Plan: Impression is right knee and left hip contusion.  Low suspicion for intra-articular pathology in the knee especially with lack of effusion.  I recommend giving this more time and taking scheduled Advil.  Questions encouraged and answered.  Follow-up as needed.  Follow-Up Instructions: No follow-ups on file.   Orders:  Orders Placed This Encounter  Procedures   XR KNEE 3 VIEW RIGHT   XR HIP UNILAT W OR W/O PELVIS 2-3 VIEWS LEFT   Meds ordered this encounter  Medications   ibuprofen (ADVIL) 800 MG tablet    Sig: Take 1 tablet (800 mg total) by mouth every 8 (eight) hours as needed.    Dispense:  30 tablet    Refill:  2      Procedures: No procedures performed   Clinical Data: No additional findings.   Subjective: Chief Complaint  Patient presents with   Right Knee - Pain    HPI [name] is a 61 year old female here for evaluation of right knee pain and left hip pain after a mechanical fall on July 26 and then again on July 30.  She fell directly on her knee which altered her gait and her left hip started to hurt a week or 2 after that.  Hip is tender to touch.  The knee is tender and swollen around the kneecap.  Denies any constitutional symptoms. Review of Systems  Constitutional: Negative.   HENT: Negative.    Eyes: Negative.   Respiratory: Negative.    Cardiovascular: Negative.   Endocrine: Negative.   Musculoskeletal: Negative.   Neurological: Negative.   Hematological: Negative.   Psychiatric/Behavioral: Negative.    All other systems reviewed and are negative.    Objective: Vital Signs: There  were no vitals taken for this visit.  Physical Exam Vitals and nursing note reviewed.  Constitutional:      Appearance: She is well-developed.  HENT:     Head: Atraumatic.     Nose: Nose normal.  Eyes:     Extraocular Movements: Extraocular movements intact.  Cardiovascular:     Pulses: Normal pulses.  Pulmonary:     Effort: Pulmonary effort is normal.  Abdominal:     Palpations: Abdomen is soft.  Musculoskeletal:     Cervical back: Neck supple.  Skin:    General: Skin is warm.     Capillary Refill: Capillary refill takes less than 2 seconds.  Neurological:     Mental Status: She is alert. Mental status is at baseline.  Psychiatric:        Behavior: Behavior normal.        Thought Content: Thought content normal.        Judgment: Judgment normal.     Ortho Exam Examination of the right knee shows no joint effusion.  Slight bony tenderness to the patella.  No real joint line tenderness.  Collaterals and cruciates are stable. Emanation of the left hip shows tenderness to  the greater trochanter.  Good range of motion without groin pain.  Manual muscle testing is normal. Specialty Comments:  No specialty comments available.  Imaging: XR KNEE 3 VIEW RIGHT  Result Date: 07/01/2022 Mild osteoarthritis with spurring of the patella.  No acute abnormalities.  XR HIP UNILAT W OR W/O PELVIS 2-3 VIEWS LEFT  Result Date: 07/01/2022 Mild osteoarthritis.  No acute abnormalities.    PMFS History: Patient Active Problem List   Diagnosis Date Noted   Solitary pulmonary nodule 06/17/2022   Generalized abdominal pain 11/12/2021   Loss of weight 11/12/2021   Right wrist pain 03/27/2020   Neuropathy 08/21/2019   Chronic foot pain, left 08/21/2019   Seasonal allergies 08/21/2019   Right upper quadrant pain 05/08/2019   Gastroesophageal reflux disease with esophagitis 05/08/2019   Hot flashes due to menopause 05/08/2019   Carpal tunnel syndrome on both sides 25/42/7062   Metabolic  syndrome 37/62/8315   Essential hypertension 11/19/2016   Allergic dermatitis 11/19/2016   Allergy status to unspecified drugs, medicaments and biological substances status 11/19/2016   BP check 09/22/2016   Post-operative state 06/02/2016   Obesity 01/08/2016   Low back pain 10/06/2015   Pain in joint, ankle and foot 10/06/2015   Past Medical History:  Diagnosis Date   GERD (gastroesophageal reflux disease)    Hypertension    Stroke ([place]) 2007    Family History  Problem Relation Age of Onset   Hypertension Mother    Hypertension Sister    Breast cancer Sister    Colon cancer Neg Hx    Stomach cancer Neg Hx     Past Surgical History:  Procedure Laterality Date   FOOT SURGERY  2015   left side had 2 surgeries in 2015   [place]  2010   Social History   Occupational History   Not on file  Tobacco Use   Smoking status: Former    Packs/day: 0.25    Years: 10.00    Total pack years: 2.50    Types: Cigarettes    Quit date: 2012    Years since quitting: 11.6   Smokeless tobacco: Never  Vaping Use   Vaping Use: Never used  Substance and Sexual Activity   Alcohol use: Yes    Comment: occ wine    Drug use: No   Sexual activity: Not Currently

## 2022-07-02 ENCOUNTER — Telehealth: Payer: Self-pay | Admitting: Nurse Practitioner

## 2022-07-02 NOTE — Telephone Encounter (Signed)
Patient LVM on nurse line 8/24 stating she missed a call from this office. Please call back - having issues with knee.

## 2022-07-07 ENCOUNTER — Other Ambulatory Visit: Payer: Self-pay

## 2022-07-07 ENCOUNTER — Telehealth: Payer: Self-pay

## 2022-07-07 NOTE — Telephone Encounter (Signed)
We ordered a CT scan at last due to pulmonary nodule. I do not see that this has been scheduled.

## 2022-07-08 ENCOUNTER — Other Ambulatory Visit: Payer: Self-pay

## 2022-07-13 ENCOUNTER — Other Ambulatory Visit: Payer: Self-pay

## 2022-08-24 ENCOUNTER — Other Ambulatory Visit: Payer: Self-pay | Admitting: Nurse Practitioner

## 2022-08-24 ENCOUNTER — Other Ambulatory Visit: Payer: Self-pay

## 2022-08-24 DIAGNOSIS — K219 Gastro-esophageal reflux disease without esophagitis: Secondary | ICD-10-CM

## 2022-08-24 DIAGNOSIS — J302 Other seasonal allergic rhinitis: Secondary | ICD-10-CM

## 2022-08-24 DIAGNOSIS — I10 Essential (primary) hypertension: Secondary | ICD-10-CM

## 2022-08-24 DIAGNOSIS — Z760 Encounter for issue of repeat prescription: Secondary | ICD-10-CM

## 2022-08-24 MED ORDER — OMEPRAZOLE 40 MG PO CPDR
40.0000 mg | DELAYED_RELEASE_CAPSULE | Freq: Every day | ORAL | 3 refills | Status: DC
  Filled 2022-08-24: qty 90, 90d supply, fill #0
  Filled 2022-11-23: qty 90, 90d supply, fill #1
  Filled 2023-02-18: qty 90, 90d supply, fill #2
  Filled 2023-05-19: qty 90, 90d supply, fill #3

## 2022-08-24 MED ORDER — OLOPATADINE HCL 0.2 % OP SOLN
1.0000 [drp] | Freq: Every day | OPHTHALMIC | 3 refills | Status: AC
  Filled 2022-08-24: qty 5, 100d supply, fill #0

## 2022-08-24 MED ORDER — LISINOPRIL-HYDROCHLOROTHIAZIDE 20-12.5 MG PO TABS
1.0000 | ORAL_TABLET | Freq: Every day | ORAL | 3 refills | Status: DC
  Filled 2022-08-24: qty 90, 90d supply, fill #0
  Filled 2022-11-23: qty 90, 90d supply, fill #1
  Filled 2023-02-18: qty 90, 90d supply, fill #2
  Filled 2023-05-19: qty 90, 90d supply, fill #3

## 2022-08-25 ENCOUNTER — Telehealth: Payer: Self-pay | Admitting: Nurse Practitioner

## 2022-08-25 ENCOUNTER — Other Ambulatory Visit: Payer: Self-pay

## 2022-08-25 NOTE — Telephone Encounter (Signed)
Refill request- LVM did not state which meds, stated gets them refilled every month

## 2022-08-26 ENCOUNTER — Other Ambulatory Visit: Payer: Self-pay

## 2022-08-26 NOTE — Telephone Encounter (Signed)
3 medications requested and filled 08/24/22

## 2022-08-27 ENCOUNTER — Other Ambulatory Visit: Payer: Self-pay | Admitting: Nurse Practitioner

## 2022-08-27 ENCOUNTER — Other Ambulatory Visit: Payer: Self-pay

## 2022-08-28 ENCOUNTER — Other Ambulatory Visit: Payer: Self-pay | Admitting: Nurse Practitioner

## 2022-08-28 DIAGNOSIS — I10 Essential (primary) hypertension: Secondary | ICD-10-CM

## 2022-08-28 DIAGNOSIS — Z760 Encounter for issue of repeat prescription: Secondary | ICD-10-CM

## 2022-08-30 ENCOUNTER — Other Ambulatory Visit: Payer: Self-pay

## 2022-08-30 MED ORDER — AMLODIPINE BESYLATE 5 MG PO TABS
5.0000 mg | ORAL_TABLET | Freq: Every day | ORAL | 3 refills | Status: DC
  Filled 2022-08-30: qty 90, 90d supply, fill #0
  Filled 2022-11-23: qty 90, 90d supply, fill #1
  Filled 2023-02-24: qty 90, 90d supply, fill #2
  Filled 2023-05-19: qty 90, 90d supply, fill #3

## 2022-09-09 ENCOUNTER — Ambulatory Visit: Payer: Medicaid Other | Admitting: Nurse Practitioner

## 2022-09-17 ENCOUNTER — Encounter: Payer: Self-pay | Admitting: Nurse Practitioner

## 2022-09-17 ENCOUNTER — Other Ambulatory Visit: Payer: Self-pay

## 2022-09-17 ENCOUNTER — Ambulatory Visit (INDEPENDENT_AMBULATORY_CARE_PROVIDER_SITE_OTHER): Payer: Medicare Other | Admitting: Nurse Practitioner

## 2022-09-17 VITALS — BP 122/79 | HR 90 | Temp 97.9°F | Ht 61.5 in | Wt 209.0 lb

## 2022-09-17 DIAGNOSIS — M25531 Pain in right wrist: Secondary | ICD-10-CM

## 2022-09-17 DIAGNOSIS — M25552 Pain in left hip: Secondary | ICD-10-CM | POA: Diagnosis not present

## 2022-09-17 DIAGNOSIS — G8929 Other chronic pain: Secondary | ICD-10-CM

## 2022-09-17 DIAGNOSIS — I10 Essential (primary) hypertension: Secondary | ICD-10-CM | POA: Diagnosis not present

## 2022-09-17 DIAGNOSIS — R911 Solitary pulmonary nodule: Secondary | ICD-10-CM

## 2022-09-17 DIAGNOSIS — Z760 Encounter for issue of repeat prescription: Secondary | ICD-10-CM

## 2022-09-17 DIAGNOSIS — R2232 Localized swelling, mass and lump, left upper limb: Secondary | ICD-10-CM | POA: Diagnosis not present

## 2022-09-17 MED ORDER — MELOXICAM 15 MG PO TABS
15.0000 mg | ORAL_TABLET | Freq: Every day | ORAL | 0 refills | Status: DC
  Filled 2022-09-17: qty 30, 30d supply, fill #0

## 2022-09-17 MED ORDER — DICLOFENAC SODIUM 1 % EX GEL
4.0000 g | Freq: Four times a day (QID) | CUTANEOUS | 6 refills | Status: DC
  Filled 2022-09-17: qty 100, 7d supply, fill #0

## 2022-09-17 NOTE — Progress Notes (Signed)
$'@Patient'w$  ID: Deanna Floyd, female    DOB: [birth year], 61 y.o.   MRN: [id number]  Chief Complaint  Patient presents with   Hip Pain    Left hip, knot left underam.    Referring provider: Fenton Foy, NP   HPI  Deanna Floyd 61 y.o. female  has a past medical history of GERD (gastroesophageal reflux disease), Hypertension, and Stroke (2007).    Patient presents today for follow-up visit.  She states that she does continue to have left hip pain.  She has been evaluated by Ortho for this.  She did have imaging completed.  We will order physical therapy for her.  Has a lump to her left axilla region.  She has not had a mammogram in quite some time.  We will order diagnostic mammogram and of left axilla.  Patient does need to get a CT scan of her chest for a follow-up pulmonary nodule.  This was ordered at her last visit but she has not had this completed yet.  Try to reschedule her. Denies f/c/s, n/v/d, hemoptysis, PND, leg swelling Denies chest pain or edema        Allergies  Allergen Reactions   Hydrocortisone Hives   Lyrica [Pregabalin] Rash    Immunization History  Administered Date(s) Administered   Influenza,inj,Quad PF,6+ Mos 10/06/2015, 08/06/2016, 07/04/2017, 08/01/2018, 08/21/2019, 12/21/2021   PFIZER(Purple Top)SARS-COV-2 Vaccination 04/12/2020, 05/03/2020, 04/08/2021    Past Medical History:  Diagnosis Date   GERD (gastroesophageal reflux disease)    Hypertension    Stroke ([place]) 2007    Tobacco History: Social History   Tobacco Use  Smoking Status Former   Packs/day: 0.25   Years: 10.00   Total pack years: 2.50   Types: Cigarettes   Quit date: 2012   Years since quitting: 11.8  Smokeless Tobacco Never   Counseling given: Not Answered   Outpatient Encounter Medications as of 09/17/2022  Medication Sig   acetaminophen (TYLENOL) 500 MG tablet Take 1 tablet (500 mg total) by mouth every 6 (six) hours as needed.   amLODipine (NORVASC) 5 MG  tablet Take 1 tablet (5 mg total) by mouth daily.   cyclobenzaprine (FLEXERIL) 10 MG tablet Take 1 tablet (10 mg total) by mouth 3 (three) times daily as needed for muscle spasms.   diphenhydrAMINE (BENADRYL) 2 % cream Apply topically 3 (three) times daily as needed for itching.   gabapentin (NEURONTIN) 300 MG capsule Take 1 capsule (300 mg total) by mouth 2 (two) times daily. As needed.   ibuprofen (ADVIL) 800 MG tablet Take 1 tablet (800 mg total) by mouth every 8 (eight) hours as needed.   ibuprofen (ADVIL) 800 MG tablet Take 1 tablet (800 mg total) by mouth every 8 (eight) hours as needed.   lisinopril-hydrochlorothiazide (ZESTORETIC) 20-12.5 MG tablet TAKE 1 TABLET BY MOUTH DAILY.   meloxicam (MOBIC) 15 MG tablet Take 1 tablet (15 mg total) by mouth daily.   Multiple Vitamin (MULTIVITAMIN WITH MINERALS) TABS tablet Take 1 tablet by mouth daily.   mupirocin ointment (BACTROBAN) 2 % Apply 1 Application topically 2 (two) times daily.   Olopatadine HCl 0.2 % SOLN Place 1 drop into both eyes daily. One drop into each eye daily, as needed.   omeprazole (PRILOSEC) 40 MG capsule Take 1 capsule (40 mg total) by mouth daily.   [DISCONTINUED] diclofenac Sodium (VOLTAREN) 1 % GEL Apply 4 g topically 4 (four) times daily.   diclofenac Sodium (VOLTAREN) 1 % GEL Apply 4 g topically 4 (  four) times daily.   No facility-administered encounter medications on file as of 09/17/2022.     Review of Systems  Review of Systems  Constitutional: Negative.   HENT: Negative.    Cardiovascular: Negative.   Gastrointestinal: Negative.   Musculoskeletal:  Positive for arthralgias, Floyd pain and myalgias.  Allergic/Immunologic: Negative.   Neurological: Negative.   Psychiatric/Behavioral: Negative.         Physical Exam  BP 122/79   Pulse 90   Temp 97.9 F (36.6 C)   Ht 5' 1.5" (1.562 m)   Wt 209 lb (94.8 kg)   SpO2 96%   BMI 38.85 kg/m   Wt Readings from Last 5 Encounters:  09/17/22 209 lb (94.8  kg)  06/17/22 206 lb 12.8 oz (93.8 kg)  06/02/22 209 lb (94.8 kg)  03/09/22 210 lb 8 oz (95.5 kg)  12/21/21 210 lb 3.2 oz (95.3 kg)     Physical Exam Vitals and nursing note reviewed.  Constitutional:      General: She is not in acute distress.    Appearance: She is well-developed.  Cardiovascular:     Rate and Rhythm: Normal rate and regular rhythm.  Pulmonary:     Effort: Pulmonary effort is normal.     Breath sounds: Normal breath sounds.  Chest:       Comments: Lump to left axilla Neurological:     Mental Status: She is alert and oriented to person, place, and time.      Lab Results:  CBC    Component Value Date/Time   WBC 4.0 03/09/2022 1405   WBC 4.9 06/07/2016 1600   RBC 3.92 03/09/2022 1405   RBC 3.54 (L) 06/07/2016 1600   HGB 12.6 03/09/2022 1405   HCT 37.8 03/09/2022 1405   PLT 327 03/09/2022 1405   MCV 96 03/09/2022 1405   MCH 32.1 03/09/2022 1405   MCH 31.6 06/07/2016 1600   MCHC 33.3 03/09/2022 1405   MCHC 32.8 06/07/2016 1600   RDW 12.8 03/09/2022 1405   LYMPHSABS 1.7 03/09/2022 1405   MONOABS 441 06/07/2016 1600   EOSABS 0.1 03/09/2022 1405   BASOSABS 0.0 03/09/2022 1405    BMET    Component Value Date/Time   NA 139 03/09/2022 1405   K 3.8 03/09/2022 1405   CL 102 03/09/2022 1405   CO2 26 03/09/2022 1405   GLUCOSE 107 (H) 03/09/2022 1405   GLUCOSE 106 (H) 11/12/2021 1558   BUN 12 03/09/2022 1405   CREATININE 0.69 03/09/2022 1405   CREATININE 0.82 11/19/2016 1105   CALCIUM 9.6 03/09/2022 1405   GFRNONAA 87 03/12/2020 1420   GFRNONAA 80 11/19/2016 1105   GFRAA 100 03/12/2020 1420   GFRAA >89 11/19/2016 1105      Assessment & Plan:   Axillary lump, left - [place] AXILLA LEFT - [facility]   2. Left hip pain  - meloxicam (MOBIC) 15 MG tablet; Take 1 tablet (15 mg total) by mouth daily.  Dispense: 30 tablet; Refill: 0     Follow up:  Follow up in 6 months or sooner if needed     Fenton Foy,  NP 09/17/2022

## 2022-09-17 NOTE — Patient Instructions (Addendum)
1. Axillary lump, left  - [place] AXILLA LEFT - [facility] Bil[facility]   2. Left hip pain  - meloxicam (MOBIC) 15 MG tablet; Take 1 tablet (15 mg total) by mouth daily.  Dispense: 30 tablet; Refill: 0     Follow up:  Follow up in 6 months or sooner if needed

## 2022-09-17 NOTE — Assessment & Plan Note (Signed)
-   [place] AXILLA LEFT - [facility] Bil[facility]   2. Left hip pain  - meloxicam (MOBIC) 15 MG tablet; Take 1 tablet (15 mg total) by mouth daily.  Dispense: 30 tablet; Refill: 0     Follow up:  Follow up in 6 months or sooner if needed

## 2022-09-18 LAB — CBC
Hematocrit: 39.8 % (ref 34.0–46.6)
Hemoglobin: 13.3 g/dL (ref 11.1–15.9)
MCH: 32.8 pg (ref 26.6–33.0)
MCHC: 33.4 g/dL (ref 31.5–35.7)
MCV: 98 fL — ABNORMAL HIGH (ref 79–97)
Platelets: 238 10*3/uL (ref 150–450)
RBC: 4.06 x10E6/uL (ref 3.77–5.28)
RDW: 12.3 % (ref 11.7–15.4)
WBC: 5.2 10*3/uL (ref 3.4–10.8)

## 2022-09-18 LAB — COMPREHENSIVE METABOLIC PANEL
ALT: 19 IU/L (ref 0–32)
AST: 15 IU/L (ref 0–40)
Albumin/Globulin Ratio: 1.9 (ref 1.2–2.2)
Albumin: 4.2 g/dL (ref 3.9–4.9)
Alkaline Phosphatase: 50 IU/L (ref 44–121)
BUN/Creatinine Ratio: 14 (ref 12–28)
BUN: 10 mg/dL (ref 8–27)
Bilirubin Total: 0.4 mg/dL (ref 0.0–1.2)
CO2: 23 mmol/L (ref 20–29)
Calcium: 9.3 mg/dL (ref 8.7–10.3)
Chloride: 103 mmol/L (ref 96–106)
Creatinine, Ser: 0.74 mg/dL (ref 0.57–1.00)
Globulin, Total: 2.2 g/dL (ref 1.5–4.5)
Glucose: 97 mg/dL (ref 70–99)
Potassium: 3.8 mmol/L (ref 3.5–5.2)
Sodium: 143 mmol/L (ref 134–144)
Total Protein: 6.4 g/dL (ref 6.0–8.5)
eGFR: 91 mL/min/{1.73_m2} (ref >59)

## 2022-09-20 ENCOUNTER — Other Ambulatory Visit: Payer: Self-pay

## 2022-09-22 ENCOUNTER — Ambulatory Visit
Admission: RE | Admit: 2022-09-22 | Discharge: 2022-09-22 | Disposition: A | Discharge status: Home / Self Care | Payer: Medicaid Other | Source: Ambulatory Visit | Attending: Nurse Practitioner | Admitting: Nurse Practitioner

## 2022-10-04 ENCOUNTER — Other Ambulatory Visit: Payer: Self-pay

## 2022-10-04 ENCOUNTER — Ambulatory Visit: Payer: Medicare Other | Attending: Nurse Practitioner

## 2022-10-04 DIAGNOSIS — M25552 Pain in left hip: Secondary | ICD-10-CM | POA: Diagnosis not present

## 2022-10-04 DIAGNOSIS — M6281 Muscle weakness (generalized): Secondary | ICD-10-CM | POA: Insufficient documentation

## 2022-10-04 DIAGNOSIS — R2689 Other abnormalities of gait and mobility: Secondary | ICD-10-CM | POA: Insufficient documentation

## 2022-10-04 NOTE — Therapy (Signed)
OUTPATIENT PHYSICAL THERAPY LOWER EXTREMITY EVALUATION   Patient Name: Deanna Floyd MRN: [id number] DOB:[birth year], 61 y.o., female Today's Date: 10/04/2022  END OF SESSION:  PT End of Session - 10/04/22 0955     Visit Number 1    Number of Visits 17    Date for PT Re-Evaluation 11/29/22    Authorization Type [employer] Medicare    PT Start Time 0915    PT Stop Time 0950    PT Time Calculation (min) 35 min    Activity Tolerance Patient tolerated treatment well    Behavior During Therapy [facility] for tasks assessed/performed             Past Medical History:  Diagnosis Date   GERD (gastroesophageal reflux disease)    Hypertension    Stroke ([place]) 2007   Past Surgical History:  Procedure Laterality Date   FOOT SURGERY  2015   left side had 2 surgeries in 2015   [place]  2010   Patient Active Problem List   Diagnosis Date Noted   Axillary lump, left 09/17/2022   Solitary pulmonary nodule 06/17/2022   Generalized abdominal pain 11/12/2021   Loss of weight 11/12/2021   Right wrist pain 03/27/2020   Neuropathy 08/21/2019   Chronic foot pain, left 08/21/2019   Seasonal allergies 08/21/2019   Right upper quadrant pain 05/08/2019   Gastroesophageal reflux disease with esophagitis 05/08/2019   Hot flashes due to menopause 05/08/2019   Carpal tunnel syndrome on both sides 42/68/3419   Metabolic syndrome 62/22/9798   Essential hypertension 11/19/2016   Allergic dermatitis 11/19/2016   Allergy status to unspecified drugs, medicaments and biological substances status 11/19/2016   BP check 09/22/2016   Post-operative state 06/02/2016   Obesity 01/08/2016   Low back pain 10/06/2015   Pain in joint, ankle and foot 10/06/2015    PCP: Ellery Plunk  REFERRING PROVIDER: Ellery Plunk  REFERRING DIAG: [phone] (ICD-10-CM) - Left hip pain   THERAPY DIAG:  Pain in left hip - Plan: PT plan of care cert/re-cert  Muscle weakness (generalized) - Plan: PT  plan of care cert/re-cert  Other abnormalities of gait and mobility - Plan: PT plan of care cert/re-cert  Rationale for Evaluation and Treatment: Rehabilitation  ONSET DATE: Chronic  SUBJECTIVE:   SUBJECTIVE STATEMENT: Pt presents to PT with reports of L hip pain after two mechanical falls in July 2023. Pain in L hip has lingered and she notes pain worse at night and in the morning. Also notes some R hip and bilateral wrist discomfort. Feels like she can only stand about 15 minutes before pain greatly increases in L hip. Past hx of CVA with no residual deficits.   PERTINENT HISTORY: CVA, HTN  PAIN:  Are you having pain?  Yes: NPRS scale: 6/10 Best: 6/10 Worst: 10/10 Pain location: left hip Pain description: sharp Aggravating factors: mornings, prolonged standing, walking Relieving factors: heat, medication  PRECAUTIONS: None  WEIGHT BEARING RESTRICTIONS: No  FALLS:  Has patient fallen in last 6 months? Yes. Number of falls: two mechanical in July 2023  LIVING ENVIRONMENT: Lives with: lives with their family Lives in: House/apartment Stairs: Yes: Internal: 12 steps; on left going up  OCCUPATION: Not working  PLOF: Independent with basic ADLs  PATIENT GOALS: increase standing tolerance, decrease pain  OBJECTIVE:   DIAGNOSTIC FINDINGS:   See imaging   PATIENT SURVEYS:  FOTO: 56% function; 66% predicted   COGNITION: Overall cognitive status: Within functional limits for  tasks assessed     SENSATION: WFL  POSTURE: rounded shoulders and forward head  PALPATION: TTP to L glute medius, L piriformis, L trochanteric bursa  VITALS: BP: 134/84 - L arm in sitting  LOWER EXTREMITY MMT:  MMT Right eval Left eval  Hip flexion 4/5 3/5 p!  Hip extension    Hip abduction 4/5 3/5 p!  Hip adduction 4/5 3/5 p!  Hip internal rotation    Hip external rotation    Knee flexion    Knee extension    Ankle dorsiflexion    Ankle plantarflexion    Ankle inversion     Ankle eversion     (Blank rows = not tested)  LOWER EXTREMITY SPECIAL TESTS:  Hip special tests: Saralyn Pilar (FABER) test: positive  and Hip scouring test: positive   FUNCTIONAL TESTS:  30 Second Sit to Stand: 6 reps  GAIT: Distance walked: 57f Assistive device utilized: None Level of assistance: Complete Independence Comments: antalgic gait L   TREATMENT: [facility] Adult PT Treatment:                                                DATE: 10/04/2022 Therapeutic Exercise: LTR x 5 each  Supine fig 4 x 30" L Supine clamshell x 10 GTB Supine SLR x 10  PATIENT EDUCATION:  Education details: eval findings, FOTO, HEP, POC Person educated: Patient Education method: Explanation, Demonstration, and Handouts Education comprehension: verbalized understanding and returned demonstration  HOME EXERCISE PROGRAM: Access Code: [email or web address] URL: [email or web address] Date: 10/04/2022 Prepared by: DOctavio Manns Exercises - Supine Lower Trunk Rotation  - 1 x daily - 7 x weekly - 2 sets - 10 reps - 5 sec hold - Supine Figure 4 Piriformis Stretch  - 1 x daily - 7 x weekly - 2 reps - 30 sec hold - Hooklying Clamshell with Resistance  - 1 x daily - 7 x weekly - 3 sets - 10 reps - green theraband hold - Active Straight Leg Raise with Quad Set  - 1 x daily - 7 x weekly - 2 sets - 10 reps  ASSESSMENT:  CLINICAL IMPRESSION: Patient is a 61y.o. F who was seen today for physical therapy evaluation and treatment for chronic L hip pain. Physical findings consistent with referring provider impression as pt demonstrates decrease in proximal hip strength and functional mobility. Her FOTO score demonstrates decrease in functional ability below PLOF. Pt would benefit from skilled PT services working on improving proximal hip strength and mobility in order to decrease pain.   OBJECTIVE IMPAIRMENTS: decreased activity tolerance, decreased balance, decreased mobility, difficulty walking, decreased ROM,  decreased strength, and pain.   ACTIVITY LIMITATIONS: carrying, lifting, standing, squatting, stairs, transfers, and locomotion level  PARTICIPATION LIMITATIONS: meal prep, cleaning, driving, shopping, community activity, and yard work  PERSONAL FACTORS: Fitness, Time since onset of injury/illness/exacerbation, and 1-2 comorbidities: CVA, HTN  are also affecting patient's functional outcome.   REHAB POTENTIAL: Excellent  CLINICAL DECISION MAKING: Evolving/moderate complexity  EVALUATION COMPLEXITY: Moderate   GOALS: Goals reviewed with patient? No  SHORT TERM GOALS: Target date: 10/25/2022   Pt will be compliant and knowledgeable with initial HEP for improved comfort and carryover Baseline: initial HEP given  Goal status: INITIAL  2.  Pt will self report left hip pain no greater than 7/10 for improved comfort and functional ability Baseline: 10/10  at worst Goal status: INITIAL   LONG TERM GOALS: Target date: 11/29/2022  Pt will improve FOTO function score to no less than 66% as proxy for functional improvement Baseline: 56% function Goal status: INITIAL   2.  Pt will self report left hip pain no greater than 3/10 for improved comfort and functional ability Baseline: 10/10 at worst Goal status: INITIAL   3.  Pt will increase 30 Second Sit to Stand rep count to no less than 9 reps for improved balance, strength, and functional mobility Baseline: 6 reps  Goal status: INITIAL   4.  Pt will be able to stand at least 30 minutes without increase in left hip pain for improved comfort and functional ability when performing home ADLs Baseline: 15 minutes Goal status: INITIAL  5.  Pt will increase left hip flex/abd to no less than 4/5 for improved functional mobility Baseline: see chart Goal status: INITIAL  PLAN:  PT FREQUENCY: 2x/week  PT DURATION: 8 weeks  PLANNED INTERVENTIONS: Therapeutic exercises, Therapeutic activity, Neuromuscular re-education, Balance training,  Gait training, Patient/Family education, Self Care, Joint mobilization, Aquatic Therapy, Dry Needling, Electrical stimulation, Cryotherapy, Moist heat, Manual therapy, and Re-evaluation  PLAN FOR NEXT SESSION: assess HEP response, proximal hip strenghtening   Ward Chatters, PT 10/04/2022, 9:57 AM

## 2022-10-12 ENCOUNTER — Ambulatory Visit: Payer: Medicare Other | Attending: Nurse Practitioner

## 2022-10-12 DIAGNOSIS — R2689 Other abnormalities of gait and mobility: Secondary | ICD-10-CM | POA: Diagnosis present

## 2022-10-12 DIAGNOSIS — M6281 Muscle weakness (generalized): Secondary | ICD-10-CM | POA: Diagnosis present

## 2022-10-12 DIAGNOSIS — M25552 Pain in left hip: Secondary | ICD-10-CM | POA: Diagnosis present

## 2022-10-12 NOTE — Therapy (Signed)
OUTPATIENT PHYSICAL THERAPY TREATMENT NOTE   Patient Name: Deanna Floyd MRN: [id number] DOB:[birth year], 61 y.o., female Today's Date: 10/12/2022  PCP: Ellery Plunk  REFERRING PROVIDER: Ellery Plunk   END OF SESSION:   PT End of Session - 10/12/22 0955     Visit Number 2    Number of Visits 17    Date for PT Re-Evaluation 11/29/22    Authorization Type [employer] Medicare    PT Start Time 1000    PT Stop Time 6387    PT Time Calculation (min) 38 min    Activity Tolerance Patient tolerated treatment well    Behavior During Therapy WFL for tasks assessed/performed             Past Medical History:  Diagnosis Date   GERD (gastroesophageal reflux disease)    Hypertension    Stroke ([place]) 2007   Past Surgical History:  Procedure Laterality Date   FOOT SURGERY  2015   left side had 2 surgeries in 2015   [place]  2010   Patient Active Problem List   Diagnosis Date Noted   Axillary lump, left 09/17/2022   Solitary pulmonary nodule 06/17/2022   Generalized abdominal pain 11/12/2021   Loss of weight 11/12/2021   Right wrist pain 03/27/2020   Neuropathy 08/21/2019   Chronic foot pain, left 08/21/2019   Seasonal allergies 08/21/2019   Right upper quadrant pain 05/08/2019   Gastroesophageal reflux disease with esophagitis 05/08/2019   Hot flashes due to menopause 05/08/2019   Carpal tunnel syndrome on both sides 56/43/3295   Metabolic syndrome 18/84/1660   Essential hypertension 11/19/2016   Allergic dermatitis 11/19/2016   Allergy status to unspecified drugs, medicaments and biological substances status 11/19/2016   BP check 09/22/2016   Post-operative state 06/02/2016   Obesity 01/08/2016   Low back pain 10/06/2015   Pain in joint, ankle and foot 10/06/2015    REFERRING DIAG: M25.552 (ICD-10-CM) - Left hip pain    THERAPY DIAG:  Pain in left hip  Muscle weakness (generalized)  Other abnormalities of gait and mobility  Rationale  for Evaluation and Treatment Rehabilitation  PERTINENT HISTORY: CVA, HTN   PRECAUTIONS: None   SUBJECTIVE:                                                                                                                                                                                      SUBJECTIVE STATEMENT:  Pt presents to PT with no current reports of pain. Had been feeling under the weather but is doing better today. Pt has been compliant with HEP with no adverse effect. Pt is ready to  begin PT at this time.    PAIN:  Are you having pain?  Yes: NPRS scale: 0/10 Best: 6/10 Worst: 10/10 Pain location: left hip Pain description: sharp Aggravating factors: mornings, prolonged standing, walking Relieving factors: heat, medication   OBJECTIVE: (objective measures completed at initial evaluation unless otherwise dated)  DIAGNOSTIC FINDINGS:             See imaging    PATIENT SURVEYS:  FOTO: 56% function; 66% predicted    COGNITION: Overall cognitive status: Within functional limits for tasks assessed                         SENSATION: WFL   POSTURE: rounded shoulders and forward head   PALPATION: TTP to L glute medius, L piriformis, L trochanteric bursa   VITALS: BP: 134/84 - L arm in sitting   LOWER EXTREMITY MMT:   MMT Right eval Left eval  Hip flexion 4/5 3/5 p!  Hip extension      Hip abduction 4/5 3/5 p!  Hip adduction 4/5 3/5 p!  Hip internal rotation      Hip external rotation      Knee flexion      Knee extension      Ankle dorsiflexion      Ankle plantarflexion      Ankle inversion      Ankle eversion       (Blank rows = not tested)   LOWER EXTREMITY SPECIAL TESTS:  Hip special tests: Saralyn Pilar (FABER) test: positive  and Hip scouring test: positive    FUNCTIONAL TESTS:  30 Second Sit to Stand: 6 reps   GAIT: Distance walked: 33f Assistive device utilized: None Level of assistance: Complete Independence Comments: antalgic gait L      TREATMENT: [facility] Adult PT Treatment:                                                DATE: 10/12/2022 Therapeutic Exercise: NuStep lvl 5 UE/LE x 3 min while taking subjective  LTR x 10 each  Bridge 2x10  Seated clamshell 3x10 BTB Seated ball squeeze 2x10 - 5" hold Supine SLR 2x10 STS 2x10 - no UE Standing hip abd/ext x 10 each Seated fig 4 2x30" L  [facility] Adult PT Treatment:                                                DATE: 10/04/2022 Therapeutic Exercise: LTR x 5 each  Supine fig 4 x 30" L Supine clamshell x 10 GTB Supine SLR x 10   PATIENT EDUCATION:  Education details: HEP update Person educated: Patient Education method: Explanation, Demonstration, and Handouts Education comprehension: verbalized understanding and returned demonstration   HOME EXERCISE PROGRAM: Access Code: [email or web address] URL: [email or web address] Date: 10/12/2022 Prepared by: DOctavio Manns Exercises - Supine Lower Trunk Rotation  - 1 x daily - 7 x weekly - 2 sets - 10 reps - 5 sec hold - Supine Figure 4 Piriformis Stretch  - 1 x daily - 7 x weekly - 2 reps - 30 sec hold - Hooklying Clamshell with Resistance  - 1 x daily - 7 x weekly - 3 sets - 10 reps -  green theraband hold - Active Straight Leg Raise with Quad Set  - 1 x daily - 7 x weekly - 2 sets - 10 reps - Supine Bridge  - 1 x daily - 7 x weekly - 2 sets - 10 reps - Seated Hip Adduction Isometrics with Ball  - 1 x daily - 7 x weekly - 2 sets - 10 reps - 5 sec hold   ASSESSMENT:   CLINICAL IMPRESSION: Pt was able to complete all prescribed exercises with no adverse effect or increase in pain. Therapy today focused on improving proximal hip strength and functional mobility. HEP updated for continued strengthening at home. She continues to benefit from skilled PT services and will continue to be seen and progressed as able per POC.   OBJECTIVE IMPAIRMENTS: decreased activity tolerance, decreased balance, decreased mobility, difficulty  walking, decreased ROM, decreased strength, and pain.    ACTIVITY LIMITATIONS: carrying, lifting, standing, squatting, stairs, transfers, and locomotion level   PARTICIPATION LIMITATIONS: meal prep, cleaning, driving, shopping, community activity, and yard work   PERSONAL FACTORS: Fitness, Time since onset of injury/illness/exacerbation, and 1-2 comorbidities: CVA, HTN  are also affecting patient's functional outcome.      GOALS: Goals reviewed with patient? No   SHORT TERM GOALS: Target date: 10/25/2022   Pt will be compliant and knowledgeable with initial HEP for improved comfort and carryover Baseline: initial HEP given  Goal status: MET   2.  Pt will self report left hip pain no greater than 7/10 for improved comfort and functional ability Baseline: 10/10 at worst Goal status: MET   LONG TERM GOALS: Target date: 11/29/2022   Pt will improve FOTO function score to no less than 66% as proxy for functional improvement Baseline: 56% function Goal status: INITIAL    2.  Pt will self report left hip pain no greater than 3/10 for improved comfort and functional ability Baseline: 10/10 at worst Goal status: INITIAL    3.  Pt will increase 30 Second Sit to Stand rep count to no less than 9 reps for improved balance, strength, and functional mobility Baseline: 6 reps  Goal status: INITIAL    4.  Pt will be able to stand at least 30 minutes without increase in left hip pain for improved comfort and functional ability when performing home ADLs Baseline: 15 minutes Goal status: INITIAL   5.  Pt will increase left hip flex/abd to no less than 4/5 for improved functional mobility Baseline: see chart Goal status: INITIAL   PLAN:   PT FREQUENCY: 2x/week   PT DURATION: 8 weeks   PLANNED INTERVENTIONS: Therapeutic exercises, Therapeutic activity, Neuromuscular re-education, Balance training, Gait training, Patient/Family education, Self Care, Joint mobilization, Aquatic Therapy, Dry  Needling, Electrical stimulation, Cryotherapy, Moist heat, Manual therapy, and Re-evaluation   PLAN FOR NEXT SESSION: assess HEP response, proximal hip strenghtening   Ward Chatters, PT 10/12/2022, 10:41 AM

## 2022-10-13 ENCOUNTER — Other Ambulatory Visit (HOSPITAL_COMMUNITY): Payer: Self-pay

## 2022-10-13 NOTE — Therapy (Signed)
OUTPATIENT PHYSICAL THERAPY TREATMENT NOTE   Patient Name: Deanna Floyd MRN: [id number] DOB:Jan 23, 1961, 61 y.o., female Today's Date: 10/14/2022  PCP: Ellery Plunk  REFERRING PROVIDER: Ellery Plunk   END OF SESSION:   PT End of Session - 10/14/22 1008     Visit Number 3    Number of Visits 17    Date for PT Re-Evaluation 11/29/22    Authorization Type [employer] Medicare    PT Start Time 1008   arrived late   PT Stop Time 1047    PT Time Calculation (min) 39 min    Activity Tolerance Patient tolerated treatment well    Behavior During Therapy WFL for tasks assessed/performed              Past Medical History:  Diagnosis Date   GERD (gastroesophageal reflux disease)    Hypertension    Stroke ([place]) 2007   Past Surgical History:  Procedure Laterality Date   FOOT SURGERY  2015   left side had 2 surgeries in 2015   [place]  2010   Patient Active Problem List   Diagnosis Date Noted   Axillary lump, left 09/17/2022   Solitary pulmonary nodule 06/17/2022   Generalized abdominal pain 11/12/2021   Loss of weight 11/12/2021   Right wrist pain 03/27/2020   Neuropathy 08/21/2019   Chronic foot pain, left 08/21/2019   Seasonal allergies 08/21/2019   Right upper quadrant pain 05/08/2019   Gastroesophageal reflux disease with esophagitis 05/08/2019   Hot flashes due to menopause 05/08/2019   Carpal tunnel syndrome on both sides 51/88/4166   Metabolic syndrome 05/07/1600   Essential hypertension 11/19/2016   Allergic dermatitis 11/19/2016   Allergy status to unspecified drugs, medicaments and biological substances status 11/19/2016   BP check 09/22/2016   Post-operative state 06/02/2016   Obesity 01/08/2016   Low back pain 10/06/2015   Pain in joint, ankle and foot 10/06/2015    REFERRING DIAG: M25.552 (ICD-10-CM) - Left hip pain    THERAPY DIAG:  Pain in left hip  Muscle weakness (generalized)  Other abnormalities of gait and  mobility  Rationale for Evaluation and Treatment Rehabilitation  PERTINENT HISTORY: CVA, HTN   PRECAUTIONS: None   SUBJECTIVE:                                                                                                                                                                                      SUBJECTIVE STATEMENT:  Pt presents to PT with no current reports of pain in L hip. Has been compliant with HEP with no adverse effect. Pt ready to begin PT at this time.  PAIN:  Are you having pain?  Yes: NPRS scale: 0/10 Best: 6/10 Worst: 10/10 Pain location: left hip Pain description: sharp Aggravating factors: mornings, prolonged standing, walking Relieving factors: heat, medication   OBJECTIVE: (objective measures completed at initial evaluation unless otherwise dated)  DIAGNOSTIC FINDINGS:             See imaging    PATIENT SURVEYS:  FOTO: 56% function; 66% predicted    COGNITION: Overall cognitive status: Within functional limits for tasks assessed                         SENSATION: WFL   POSTURE: rounded shoulders and forward head   PALPATION: TTP to L glute medius, L piriformis, L trochanteric bursa   VITALS: BP: 134/84 - L arm in sitting   LOWER EXTREMITY MMT:   MMT Right eval Left eval  Hip flexion 4/5 3/5 p!  Hip extension      Hip abduction 4/5 3/5 p!  Hip adduction 4/5 3/5 p!  Hip internal rotation      Hip external rotation      Knee flexion      Knee extension      Ankle dorsiflexion      Ankle plantarflexion      Ankle inversion      Ankle eversion       (Blank rows = not tested)   LOWER EXTREMITY SPECIAL TESTS:  Hip special tests: Saralyn Pilar (FABER) test: positive  and Hip scouring test: positive    FUNCTIONAL TESTS:  30 Second Sit to Stand: 6 reps   GAIT: Distance walked: 3f Assistive device utilized: None Level of assistance: Complete Independence Comments: antalgic gait L     TREATMENT: [facility] Adult PT Treatment:                                                 DATE: 10/14/2022 Therapeutic Exercise: LTR x 10 each  Bridge 2x10  Supine SLR x 10 each Seated clamshell 3x15 BTB Seated ball squeeze 2x10 - 5" hold STS 2x10 - no UE LAQ 2x10 2.5# Standing hip abd/ext 2x10 each Seated fig 4 2x30" L  [facility] Adult PT Treatment:                                                DATE: 10/12/2022 Therapeutic Exercise: NuStep lvl 5 UE/LE x 3 min while taking subjective  LTR x 10 each  Bridge 2x10  Seated clamshell 3x10 BTB Seated ball squeeze 2x10 - 5" hold Supine SLR 2x10 STS 2x10 - no UE Standing hip abd/ext x 10 each Seated fig 4 2x30" L  [facility] Adult PT Treatment:                                                DATE: 10/04/2022 Therapeutic Exercise: LTR x 5 each  Supine fig 4 x 30" L Supine clamshell x 10 GTB Supine SLR x 10   PATIENT EDUCATION:  Education details: HEP update Person educated: Patient Education method: Explanation, Demonstration, and Handouts Education comprehension:  verbalized understanding and returned demonstration   HOME EXERCISE PROGRAM: Access Code: [email or web address] URL: [email or web address] Date: 10/12/2022 Prepared by: Octavio Manns  Exercises - Supine Lower Trunk Rotation  - 1 x daily - 7 x weekly - 2 sets - 10 reps - 5 sec hold - Supine Figure 4 Piriformis Stretch  - 1 x daily - 7 x weekly - 2 reps - 30 sec hold - Hooklying Clamshell with Resistance  - 1 x daily - 7 x weekly - 3 sets - 10 reps - green theraband hold - Active Straight Leg Raise with Quad Set  - 1 x daily - 7 x weekly - 2 sets - 10 reps - Supine Bridge  - 1 x daily - 7 x weekly - 2 sets - 10 reps - Seated Hip Adduction Isometrics with Ball  - 1 x daily - 7 x weekly - 2 sets - 10 reps - 5 sec hold   ASSESSMENT:   CLINICAL IMPRESSION: Pt was able to complete all prescribed exercises with no adverse effect or increase in pain. Therapy today focused on improving proximal hip strength and functional mobility.  She continues to benefit from skilled PT services and will continue to be seen and progressed as able per POC.    OBJECTIVE IMPAIRMENTS: decreased activity tolerance, decreased balance, decreased mobility, difficulty walking, decreased ROM, decreased strength, and pain.    ACTIVITY LIMITATIONS: carrying, lifting, standing, squatting, stairs, transfers, and locomotion level   PARTICIPATION LIMITATIONS: meal prep, cleaning, driving, shopping, community activity, and yard work   PERSONAL FACTORS: Fitness, Time since onset of injury/illness/exacerbation, and 1-2 comorbidities: CVA, HTN  are also affecting patient's functional outcome.      GOALS: Goals reviewed with patient? No   SHORT TERM GOALS: Target date: 10/25/2022   Pt will be compliant and knowledgeable with initial HEP for improved comfort and carryover Baseline: initial HEP given  Goal status: MET   2.  Pt will self report left hip pain no greater than 7/10 for improved comfort and functional ability Baseline: 10/10 at worst Goal status: MET   LONG TERM GOALS: Target date: 11/29/2022   Pt will improve FOTO function score to no less than 66% as proxy for functional improvement Baseline: 56% function Goal status: INITIAL    2.  Pt will self report left hip pain no greater than 3/10 for improved comfort and functional ability Baseline: 10/10 at worst Goal status: INITIAL    3.  Pt will increase 30 Second Sit to Stand rep count to no less than 9 reps for improved balance, strength, and functional mobility Baseline: 6 reps  Goal status: INITIAL    4.  Pt will be able to stand at least 30 minutes without increase in left hip pain for improved comfort and functional ability when performing home ADLs Baseline: 15 minutes Goal status: INITIAL   5.  Pt will increase left hip flex/abd to no less than 4/5 for improved functional mobility Baseline: see chart Goal status: INITIAL   PLAN:   PT FREQUENCY: 2x/week   PT DURATION:  8 weeks   PLANNED INTERVENTIONS: Therapeutic exercises, Therapeutic activity, Neuromuscular re-education, Balance training, Gait training, Patient/Family education, Self Care, Joint mobilization, Aquatic Therapy, Dry Needling, Electrical stimulation, Cryotherapy, Moist heat, Manual therapy, and Re-evaluation   PLAN FOR NEXT SESSION: assess HEP response, proximal hip strenghtening   Ward Chatters, PT 10/14/2022, 10:50 AM

## 2022-10-14 ENCOUNTER — Other Ambulatory Visit (HOSPITAL_COMMUNITY): Payer: Self-pay

## 2022-10-14 ENCOUNTER — Ambulatory Visit: Payer: Medicare Other

## 2022-10-14 DIAGNOSIS — M25552 Pain in left hip: Secondary | ICD-10-CM

## 2022-10-14 DIAGNOSIS — M6281 Muscle weakness (generalized): Secondary | ICD-10-CM

## 2022-10-14 DIAGNOSIS — R2689 Other abnormalities of gait and mobility: Secondary | ICD-10-CM

## 2022-10-14 MED ORDER — VYZULTA 0.024 % OP SOLN
1.0000 [drp] | Freq: Every evening | OPHTHALMIC | 4 refills | Status: DC
  Filled 2022-10-14: qty 5, 40d supply, fill #0

## 2022-10-15 ENCOUNTER — Other Ambulatory Visit: Payer: Self-pay

## 2022-10-18 ENCOUNTER — Other Ambulatory Visit (HOSPITAL_COMMUNITY): Payer: Self-pay

## 2022-10-18 ENCOUNTER — Ambulatory Visit: Payer: Medicare Other

## 2022-10-18 MED ORDER — VYZULTA 0.024 % OP SOLN
1.0000 [drp] | Freq: Every evening | OPHTHALMIC | 4 refills | Status: DC
  Filled 2022-10-18: qty 5, 50d supply, fill #0
  Filled 2022-12-29: qty 5, 50d supply, fill #1
  Filled 2023-02-18: qty 5, 50d supply, fill #2

## 2022-10-19 ENCOUNTER — Other Ambulatory Visit (HOSPITAL_COMMUNITY): Payer: Self-pay

## 2022-10-19 ENCOUNTER — Other Ambulatory Visit: Payer: Self-pay

## 2022-10-19 NOTE — Therapy (Signed)
OUTPATIENT PHYSICAL THERAPY TREATMENT NOTE   Patient Name: Deanna Floyd MRN: [id number] DOB:[birth year], 61 y.o., female Today's Date: 10/20/2022  PCP: Ellery Plunk  REFERRING PROVIDER: Ellery Plunk   END OF SESSION:   PT End of Session - 10/20/22 1002     Visit Number 4    Number of Visits 17    Date for PT Re-Evaluation 11/29/22    Authorization Type [employer]    PT Start Time 1001    PT Stop Time 4627    PT Time Calculation (min) 40 min    Activity Tolerance Patient tolerated treatment well    Behavior During Therapy WFL for tasks assessed/performed               Past Medical History:  Diagnosis Date   GERD (gastroesophageal reflux disease)    Hypertension    Stroke ([place]) 2007   Past Surgical History:  Procedure Laterality Date   FOOT SURGERY  2015   left side had 2 surgeries in 2015   [place]  2010   Patient Active Problem List   Diagnosis Date Noted   Axillary lump, left 09/17/2022   Solitary pulmonary nodule 06/17/2022   Generalized abdominal pain 11/12/2021   Loss of weight 11/12/2021   Right wrist pain 03/27/2020   Neuropathy 08/21/2019   Chronic foot pain, left 08/21/2019   Seasonal allergies 08/21/2019   Right upper quadrant pain 05/08/2019   Gastroesophageal reflux disease with esophagitis 05/08/2019   Hot flashes due to menopause 05/08/2019   Carpal tunnel syndrome on both sides 03/50/0938   Metabolic syndrome 18/29/9371   Essential hypertension 11/19/2016   Allergic dermatitis 11/19/2016   Allergy status to unspecified drugs, medicaments and biological substances status 11/19/2016   BP check 09/22/2016   Post-operative state 06/02/2016   Obesity 01/08/2016   Low back pain 10/06/2015   Pain in joint, ankle and foot 10/06/2015    REFERRING DIAG: M25.552 (ICD-10-CM) - Left hip pain    THERAPY DIAG:  Pain in left hip  Muscle weakness (generalized)  Other abnormalities of gait and  mobility  Rationale for Evaluation and Treatment Rehabilitation  PERTINENT HISTORY: CVA, HTN   PRECAUTIONS: None   SUBJECTIVE:                                                                                                                                                                                      SUBJECTIVE STATEMENT: Patient reports pain in her L hip and in her L ankle today, reporting that the ankle weights from last session flared up some pain from previous surgery.   PAIN:  Are you  having pain?  Yes: NPRS scale: 7-8/10 Best: 6/10 Worst: 10/10 Pain location: left hip Pain description: sharp Aggravating factors: mornings, prolonged standing, walking Relieving factors: heat, medication   OBJECTIVE: (objective measures completed at initial evaluation unless otherwise dated)  DIAGNOSTIC FINDINGS:             See imaging    PATIENT SURVEYS:  FOTO: 56% function; 66% predicted    COGNITION: Overall cognitive status: Within functional limits for tasks assessed                         SENSATION: WFL   POSTURE: rounded shoulders and forward head   PALPATION: TTP to L glute medius, L piriformis, L trochanteric bursa   VITALS: BP: 134/84 - L arm in sitting   LOWER EXTREMITY MMT:   MMT Right eval Left eval  Hip flexion 4/5 3/5 p!  Hip extension      Hip abduction 4/5 3/5 p!  Hip adduction 4/5 3/5 p!  Hip internal rotation      Hip external rotation      Knee flexion      Knee extension      Ankle dorsiflexion      Ankle plantarflexion      Ankle inversion      Ankle eversion       (Blank rows = not tested)   LOWER EXTREMITY SPECIAL TESTS:  Hip special tests: Saralyn Pilar (FABER) test: positive  and Hip scouring test: positive    FUNCTIONAL TESTS:  30 Second Sit to Stand: 6 reps   GAIT: Distance walked: 79f Assistive device utilized: None Level of assistance: Complete Independence Comments: antalgic gait L     TREATMENT: [facility] Adult PT  Treatment:                                                DATE: 10/20/2022 Therapeutic Exercise: NuStep lvl 5 UE/LE x 3 min while taking subjective  Standing hip abduction/extension RTB at ankles 2x10 each BIL LTR x 10 each  Bridge 2x10  Supine SLR x 10 each Seated clamshell 3x15 BTB Supine marching BTB 2x10 BIL Supine ball squeeze 2x10 - 5" hold STS x10 - no UE LAQ 2x10 3" hold at top (no ankle weight today due to ankle pain) Seated fig 4 x1' BIL  [facility] Adult PT Treatment:                                                DATE: 10/14/2022 Therapeutic Exercise: LTR x 10 each  Bridge 2x10  Supine SLR x 10 each Seated clamshell 3x15 BTB Seated ball squeeze 2x10 - 5" hold STS 2x10 - no UE LAQ 2x10 2.5# Standing hip abd/ext 2x10 each Seated fig 4 2x30" L  [facility] Adult PT Treatment:                                                DATE: 10/12/2022 Therapeutic Exercise: NuStep lvl 5 UE/LE x 3 min while taking subjective  LTR x 10 each  Bridge 2x10  Seated  clamshell 3x10 BTB Seated ball squeeze 2x10 - 5" hold Supine SLR 2x10 STS 2x10 - no UE Standing hip abd/ext x 10 each Seated fig 4 2x30" L    PATIENT EDUCATION:  Education details: HEP update Person educated: Patient Education method: Explanation, Demonstration, and Handouts Education comprehension: verbalized understanding and returned demonstration   HOME EXERCISE PROGRAM: Access Code: [email or web address] URL: [email or web address] Date: 10/12/2022 Prepared by: Octavio Manns  Exercises - Supine Lower Trunk Rotation  - 1 x daily - 7 x weekly - 2 sets - 10 reps - 5 sec hold - Supine Figure 4 Piriformis Stretch  - 1 x daily - 7 x weekly - 2 reps - 30 sec hold - Hooklying Clamshell with Resistance  - 1 x daily - 7 x weekly - 3 sets - 10 reps - green theraband hold - Active Straight Leg Raise with Quad Set  - 1 x daily - 7 x weekly - 2 sets - 10 reps - Supine Bridge  - 1 x daily - 7 x weekly - 2 sets - 10 reps - Seated Hip  Adduction Isometrics with Ball  - 1 x daily - 7 x weekly - 2 sets - 10 reps - 5 sec hold   ASSESSMENT:   CLINICAL IMPRESSION: Patient presents to PT with increased pain in her Lt hip and Lt ankle today, reporting that the ankle weights used last session increased pain in her ankle from a previous surgery she had. Session today focused on proximal hip strengthening. Patient was able to tolerate all prescribed exercises with no adverse effects. Patient continues to benefit from skilled PT services and should be progressed as able to improve functional independence.    OBJECTIVE IMPAIRMENTS: decreased activity tolerance, decreased balance, decreased mobility, difficulty walking, decreased ROM, decreased strength, and pain.    ACTIVITY LIMITATIONS: carrying, lifting, standing, squatting, stairs, transfers, and locomotion level   PARTICIPATION LIMITATIONS: meal prep, cleaning, driving, shopping, community activity, and yard work   PERSONAL FACTORS: Fitness, Time since onset of injury/illness/exacerbation, and 1-2 comorbidities: CVA, HTN  are also affecting patient's functional outcome.      GOALS: Goals reviewed with patient? No   SHORT TERM GOALS: Target date: 10/25/2022   Pt will be compliant and knowledgeable with initial HEP for improved comfort and carryover Baseline: initial HEP given  Goal status: MET   2.  Pt will self report left hip pain no greater than 7/10 for improved comfort and functional ability Baseline: 10/10 at worst Goal status: MET   LONG TERM GOALS: Target date: 11/29/2022   Pt will improve FOTO function score to no less than 66% as proxy for functional improvement Baseline: 56% function Goal status: INITIAL    2.  Pt will self report left hip pain no greater than 3/10 for improved comfort and functional ability Baseline: 10/10 at worst Goal status: INITIAL    3.  Pt will increase 30 Second Sit to Stand rep count to no less than 9 reps for improved balance,  strength, and functional mobility Baseline: 6 reps  Goal status: INITIAL    4.  Pt will be able to stand at least 30 minutes without increase in left hip pain for improved comfort and functional ability when performing home ADLs Baseline: 15 minutes Goal status: INITIAL   5.  Pt will increase left hip flex/abd to no less than 4/5 for improved functional mobility Baseline: see chart Goal status: INITIAL   PLAN:   PT FREQUENCY: 2x/week   PT  DURATION: 8 weeks   PLANNED INTERVENTIONS: Therapeutic exercises, Therapeutic activity, Neuromuscular re-education, Balance training, Gait training, Patient/Family education, Self Care, Joint mobilization, Aquatic Therapy, Dry Needling, Electrical stimulation, Cryotherapy, Moist heat, Manual therapy, and Re-evaluation   PLAN FOR NEXT SESSION: assess HEP response, proximal hip strenghtening   Margarette Canada, PTA 10/20/2022, 10:41 AM

## 2022-10-20 ENCOUNTER — Ambulatory Visit: Payer: Medicare Other

## 2022-10-20 DIAGNOSIS — R2689 Other abnormalities of gait and mobility: Secondary | ICD-10-CM

## 2022-10-20 DIAGNOSIS — M6281 Muscle weakness (generalized): Secondary | ICD-10-CM

## 2022-10-20 DIAGNOSIS — M25552 Pain in left hip: Secondary | ICD-10-CM | POA: Diagnosis not present

## 2022-10-25 ENCOUNTER — Ambulatory Visit: Payer: Medicare Other

## 2022-10-25 NOTE — Therapy (Incomplete)
OUTPATIENT PHYSICAL THERAPY TREATMENT NOTE   Patient Name: Deanna Floyd MRN: [id number] DOB:[birth year], 61 y.o., female Today's Date: 10/25/2022  PCP: Ellery Plunk  REFERRING PROVIDER: Ellery Plunk   END OF SESSION:       Past Medical History:  Diagnosis Date   GERD (gastroesophageal reflux disease)    Hypertension    Stroke ([place]) 2007   Past Surgical History:  Procedure Laterality Date   FOOT SURGERY  2015   left side had 2 surgeries in 2015   [place]  2010   Patient Active Problem List   Diagnosis Date Noted   Axillary lump, left 09/17/2022   Solitary pulmonary nodule 06/17/2022   Generalized abdominal pain 11/12/2021   Loss of weight 11/12/2021   Right wrist pain 03/27/2020   Neuropathy 08/21/2019   Chronic foot pain, left 08/21/2019   Seasonal allergies 08/21/2019   Right upper quadrant pain 05/08/2019   Gastroesophageal reflux disease with esophagitis 05/08/2019   Hot flashes due to menopause 05/08/2019   Carpal tunnel syndrome on both sides 88/50/2774   Metabolic syndrome 12/87/8676   Essential hypertension 11/19/2016   Allergic dermatitis 11/19/2016   Allergy status to unspecified drugs, medicaments and biological substances status 11/19/2016   BP check 09/22/2016   Post-operative state 06/02/2016   Obesity 01/08/2016   Low back pain 10/06/2015   Pain in joint, ankle and foot 10/06/2015    REFERRING DIAG: M25.552 (ICD-10-CM) - Left hip pain    THERAPY DIAG:  No diagnosis found.  Rationale for Evaluation and Treatment Rehabilitation  PERTINENT HISTORY: CVA, HTN   PRECAUTIONS: None   SUBJECTIVE:                                                                                                                                                                                      SUBJECTIVE STATEMENT: ***  PAIN:  Are you having pain?  Yes: NPRS scale: 7-8/10 Best: 6/10 Worst: 10/10 Pain location: left hip Pain  description: sharp Aggravating factors: mornings, prolonged standing, walking Relieving factors: heat, medication   OBJECTIVE: (objective measures completed at initial evaluation unless otherwise dated)  DIAGNOSTIC FINDINGS:             See imaging    PATIENT SURVEYS:  FOTO: 56% function; 66% predicted    COGNITION: Overall cognitive status: Within functional limits for tasks assessed                         SENSATION: WFL   POSTURE: rounded shoulders and forward head   PALPATION: TTP to L glute medius, L piriformis, L trochanteric bursa  VITALS: BP: 134/84 - L arm in sitting   LOWER EXTREMITY MMT:   MMT Right eval Left eval  Hip flexion 4/5 3/5 p!  Hip extension      Hip abduction 4/5 3/5 p!  Hip adduction 4/5 3/5 p!  Hip internal rotation      Hip external rotation      Knee flexion      Knee extension      Ankle dorsiflexion      Ankle plantarflexion      Ankle inversion      Ankle eversion       (Blank rows = not tested)   LOWER EXTREMITY SPECIAL TESTS:  Hip special tests: Saralyn Pilar (FABER) test: positive  and Hip scouring test: positive    FUNCTIONAL TESTS:  30 Second Sit to Stand: 6 reps   GAIT: Distance walked: 85f Assistive device utilized: None Level of assistance: Complete Independence Comments: antalgic gait L     TREATMENT: [facility] Adult PT Treatment:                                                DATE: 10/25/2022 Therapeutic Exercise: NuStep lvl 5 UE/LE x 3 min while taking subjective  Standing hip abduction/extension RTB at ankles 2x10 each BIL LTR x 10 each  Bridge 2x10  Supine SLR x 10 each Seated clamshell 3x15 BTB Supine marching BTB 2x10 BIL Supine ball squeeze 2x10 - 5" hold STS x10 - no UE LAQ 2x10 3" hold at top (no ankle weight today due to ankle pain) Seated fig 4 x1' BIL  [facility] Adult PT Treatment:                                                DATE: 10/20/2022 Therapeutic Exercise: NuStep lvl 5 UE/LE x 3 min while taking  subjective  Standing hip abduction/extension RTB at ankles 2x10 each BIL LTR x 10 each  Bridge 2x10  Supine SLR x 10 each Seated clamshell 3x15 BTB Supine marching BTB 2x10 BIL Supine ball squeeze 2x10 - 5" hold STS x10 - no UE LAQ 2x10 3" hold at top (no ankle weight today due to ankle pain) Seated fig 4 x1' BIL  [facility]                                                [facility]: [facility] Therapeutic Exercise: LTR x 10 each  Bridge 2x10  Supine SLR x 10 each Seated clamshell 3x15 BTB Seated ball squeeze 2x10 - 5" hold STS 2x10 - no UE LAQ 2x10 2.5# Standing hip abd/ext 2x10 each Seated fig 4 2x30" L  [facility] Adult PT Treatment:                                                DATE: 10/12/2022 Therapeutic Exercise: NuStep lvl 5 UE/LE x 3 min while taking subjective  LTR x 10 each  Bridge 2x10  Seated clamshell  3x10 BTB Seated ball squeeze 2x10 - 5" hold Supine SLR 2x10 STS 2x10 - no UE Standing hip abd/ext x 10 each Seated fig 4 2x30" L    PATIENT EDUCATION:  Education details: HEP update Person educated: Patient Education method: Explanation, Demonstration, and Handouts Education comprehension: verbalized understanding and returned demonstration   HOME EXERCISE PROGRAM: Access Code: [email or web address] URL: [email or web address] Date: 10/12/2022 Prepared by: Octavio Manns  Exercises - Supine Lower Trunk Rotation  - 1 x daily - 7 x weekly - 2 sets - 10 reps - 5 sec hold - Supine Figure 4 Piriformis Stretch  - 1 x daily - 7 x weekly - 2 reps - 30 sec hold - Hooklying Clamshell with Resistance  - 1 x daily - 7 x weekly - 3 sets - 10 reps - green theraband hold - Active Straight Leg Raise with Quad Set  - 1 x daily - 7 x weekly - 2 sets - 10 reps - Supine Bridge  - 1 x daily - 7 x weekly - 2 sets - 10 reps - Seated Hip Adduction Isometrics with Ball  - 1 x daily - 7 x weekly - 2 sets - 10 reps - 5 sec hold   ASSESSMENT:   CLINICAL  IMPRESSION: ***   OBJECTIVE IMPAIRMENTS: decreased activity tolerance, decreased balance, decreased mobility, difficulty walking, decreased ROM, decreased strength, and pain.    ACTIVITY LIMITATIONS: carrying, lifting, standing, squatting, stairs, transfers, and locomotion level   PARTICIPATION LIMITATIONS: meal prep, cleaning, driving, shopping, community activity, and yard work   PERSONAL FACTORS: Fitness, Time since onset of injury/illness/exacerbation, and 1-2 comorbidities: CVA, HTN  are also affecting patient's functional outcome.      GOALS: Goals reviewed with patient? No   SHORT TERM GOALS: Target date: 10/25/2022   Pt will be compliant and knowledgeable with initial HEP for improved comfort and carryover Baseline: initial HEP given  Goal status: MET   2.  Pt will self report left hip pain no greater than 7/10 for improved comfort and functional ability Baseline: 10/10 at worst Goal status: MET   LONG TERM GOALS: Target date: 11/29/2022   Pt will improve FOTO function score to no less than 66% as proxy for functional improvement Baseline: 56% function Goal status: INITIAL    2.  Pt will self report left hip pain no greater than 3/10 for improved comfort and functional ability Baseline: 10/10 at worst Goal status: INITIAL    3.  Pt will increase 30 Second Sit to Stand rep count to no less than 9 reps for improved balance, strength, and functional mobility Baseline: 6 reps  Goal status: INITIAL    4.  Pt will be able to stand at least 30 minutes without increase in left hip pain for improved comfort and functional ability when performing home ADLs Baseline: 15 minutes Goal status: INITIAL   5.  Pt will increase left hip flex/abd to no less than 4/5 for improved functional mobility Baseline: see chart Goal status: INITIAL   PLAN:   PT FREQUENCY: 2x/week   PT DURATION: 8 weeks   PLANNED INTERVENTIONS: Therapeutic exercises, Therapeutic activity, Neuromuscular  re-education, Balance training, Gait training, Patient/Family education, Self Care, Joint mobilization, Aquatic Therapy, Dry Needling, Electrical stimulation, Cryotherapy, Moist heat, Manual therapy, and Re-evaluation   PLAN FOR NEXT SESSION: assess HEP response, proximal hip strenghtening   Ward Chatters, PT 10/25/2022, 7:43 AM

## 2022-10-26 ENCOUNTER — Other Ambulatory Visit: Payer: Self-pay

## 2022-10-27 ENCOUNTER — Ambulatory Visit: Payer: Medicare Other

## 2022-10-27 DIAGNOSIS — M25552 Pain in left hip: Secondary | ICD-10-CM

## 2022-10-27 DIAGNOSIS — R2689 Other abnormalities of gait and mobility: Secondary | ICD-10-CM

## 2022-10-27 DIAGNOSIS — M6281 Muscle weakness (generalized): Secondary | ICD-10-CM

## 2022-10-27 NOTE — Therapy (Signed)
OUTPATIENT PHYSICAL THERAPY TREATMENT NOTE   Patient Name: Deanna Floyd MRN: [id number] DOB:03-12-1961, 61 y.o., female Today's Date: 10/27/2022  PCP: Ellery Plunk  REFERRING PROVIDER: Ellery Plunk   END OF SESSION:   PT End of Session - 10/27/22 1008     Visit Number 5    Number of Visits 17    Date for PT Re-Evaluation 11/29/22    Authorization Type [employer] Medicare    PT Start Time 1008   arrived late   PT Stop Time 1039    PT Time Calculation (min) 31 min    Activity Tolerance Patient tolerated treatment well    Behavior During Therapy WFL for tasks assessed/performed                Past Medical History:  Diagnosis Date   GERD (gastroesophageal reflux disease)    Hypertension    Stroke ([place]) 2007   Past Surgical History:  Procedure Laterality Date   FOOT SURGERY  2015   left side had 2 surgeries in 2015   [place]  2010   Patient Active Problem List   Diagnosis Date Noted   Axillary lump, left 09/17/2022   Solitary pulmonary nodule 06/17/2022   Generalized abdominal pain 11/12/2021   Loss of weight 11/12/2021   Right wrist pain 03/27/2020   Neuropathy 08/21/2019   Chronic foot pain, left 08/21/2019   Seasonal allergies 08/21/2019   Right upper quadrant pain 05/08/2019   Gastroesophageal reflux disease with esophagitis 05/08/2019   Hot flashes due to menopause 05/08/2019   Carpal tunnel syndrome on both sides 39/76/7341   Metabolic syndrome 93/79/0240   Essential hypertension 11/19/2016   Allergic dermatitis 11/19/2016   Allergy status to unspecified drugs, medicaments and biological substances status 11/19/2016   BP check 09/22/2016   Post-operative state 06/02/2016   Obesity 01/08/2016   Low back pain 10/06/2015   Pain in joint, ankle and foot 10/06/2015    REFERRING DIAG: M25.552 (ICD-10-CM) - Left hip pain    THERAPY DIAG:  Pain in left hip  Muscle weakness (generalized)  Other abnormalities of gait and  mobility  Rationale for Evaluation and Treatment Rehabilitation  PERTINENT HISTORY: CVA, HTN   PRECAUTIONS: None   SUBJECTIVE:                                                                                                                                                                                      SUBJECTIVE STATEMENT: Pt presents to PT with reports of decreased L hip pain and discomfort. Notes her ankle is feeling a little better today. Has been compliant with HEP with no adverse  effect. Pt is ready to begin PT at this time.  PAIN:  Are you having pain?  Yes: NPRS scale: 5/10 Best: 6/10 Worst: 10/10 Pain location: left hip Pain description: sharp Aggravating factors: mornings, prolonged standing, walking Relieving factors: heat, medication   OBJECTIVE: (objective measures completed at initial evaluation unless otherwise dated)  DIAGNOSTIC FINDINGS:             See imaging    PATIENT SURVEYS:  FOTO: 56% function; 66% predicted    COGNITION: Overall cognitive status: Within functional limits for tasks assessed                         SENSATION: WFL   POSTURE: rounded shoulders and forward head   PALPATION: TTP to L glute medius, L piriformis, L trochanteric bursa   VITALS: BP: 134/84 - L arm in sitting   LOWER EXTREMITY MMT:   MMT Right eval Left eval  Hip flexion 4/5 3/5 p!  Hip extension      Hip abduction 4/5 3/5 p!  Hip adduction 4/5 3/5 p!  Hip internal rotation      Hip external rotation      Knee flexion      Knee extension      Ankle dorsiflexion      Ankle plantarflexion      Ankle inversion      Ankle eversion       (Blank rows = not tested)   LOWER EXTREMITY SPECIAL TESTS:  Hip special tests: Saralyn Pilar (FABER) test: positive  and Hip scouring test: positive    FUNCTIONAL TESTS:  30 Second Sit to Stand: 6 reps   GAIT: Distance walked: 57f Assistive device utilized: None Level of assistance: Complete Independence Comments:  antalgic gait L     TREATMENT: [facility] Adult PT Treatment:                                                DATE: 10/27/2022 Therapeutic Exercise: NuStep lvl 5 UE/LE x 3 min while taking subjective  LTR x 10 each  Bridge 2x10  Supine SLR x 10 each Supine clamshell 3x15 BTB Supine marching BTB 2x10 BIL Supine ball squeeze 2x10 - 5" hold STS 2x10 - no UE Lateral walk at counter x 3 laps RTB   [facility] Adult PT Treatment:                                                DATE: 10/20/2022 Therapeutic Exercise: NuStep lvl 5 UE/LE x 3 min while taking subjective  Standing hip abduction/extension RTB at ankles 2x10 each BIL LTR x 10 each  Bridge 2x10  Supine SLR x 10 each Seated clamshell 3x15 BTB Supine marching BTB 2x10 BIL Supine ball squeeze 2x10 - 5" hold STS x10 - no UE LAQ 2x10 3" hold at top (no ankle weight today due to ankle pain) Seated fig 4 x1' BIL  [facility]  DATE: 10/14/2022 Therapeutic Exercise: LTR x 10 each  Bridge 2x10  Supine SLR x 10 each Seated clamshell 3x15 BTB Seated ball squeeze 2x10 - 5" hold STS 2x10 - no UE LAQ 2x10 2.5# Standing hip abd/ext 2x10 each Seated fig 4 2x30" L  [facility] Adult PT Treatment:                                                DATE: 10/12/2022 Therapeutic Exercise: NuStep lvl 5 UE/LE x 3 min while taking subjective  LTR x 10 each  Bridge 2x10  Seated clamshell 3x10 BTB Seated ball squeeze 2x10 - 5" hold Supine SLR 2x10 STS 2x10 - no UE Standing hip abd/ext x 10 each Seated fig 4 2x30" L    PATIENT EDUCATION:  Education details: HEP update Person educated: Patient Education method: Explanation, Demonstration, and Handouts Education comprehension: verbalized understanding and returned demonstration   HOME EXERCISE PROGRAM: Access Code: [email or web address] URL: [email or web address] Date: 10/27/2022 Prepared by: Octavio Manns  Exercises - Supine Lower Trunk Rotation   - 1 x daily - 7 x weekly - 2 sets - 10 reps - 5 sec hold - Supine Figure 4 Piriformis Stretch  - 1 x daily - 7 x weekly - 2 reps - 30 sec hold - Hooklying Clamshell with Resistance  - 1 x daily - 7 x weekly - 3 sets - 10 reps - green theraband hold - Active Straight Leg Raise with Quad Set  - 1 x daily - 7 x weekly - 2 sets - 10 reps - Supine Bridge  - 1 x daily - 7 x weekly - 2 sets - 10 reps - Seated Hip Adduction Isometrics with Ball  - 1 x daily - 7 x weekly - 2 sets - 10 reps - 5 sec hold - Standing Hip Abduction with Resistance at Ankles and Counter Support  - 1 x daily - 7 x weekly - 2 sets - 10 reps - red tband hold - Standing Hip Extension with Resistance at Ankles and Counter Support  - 1 x daily - 7 x weekly - 2 sets - 10 reps - red tband hold   ASSESSMENT:   CLINICAL IMPRESSION: Pt was able to complete all prescribed exercises with no adverse effect or increase in pain. Therapy today focused on improving proximal hip strength and functional mobility. She continues to benefit from skilled PT services and will continue to be seen and progressed as able per POC.  OBJECTIVE IMPAIRMENTS: decreased activity tolerance, decreased balance, decreased mobility, difficulty walking, decreased ROM, decreased strength, and pain.    ACTIVITY LIMITATIONS: carrying, lifting, standing, squatting, stairs, transfers, and locomotion level   PARTICIPATION LIMITATIONS: meal prep, cleaning, driving, shopping, community activity, and yard work   PERSONAL FACTORS: Fitness, Time since onset of injury/illness/exacerbation, and 1-2 comorbidities: CVA, HTN  are also affecting patient's functional outcome.      GOALS: Goals reviewed with patient? No   SHORT TERM GOALS: Target date: 10/25/2022   Pt will be compliant and knowledgeable with initial HEP for improved comfort and carryover Baseline: initial HEP given  Goal status: MET   2.  Pt will self report left hip pain no greater than 7/10 for improved  comfort and functional ability Baseline: 10/10 at worst Goal status: MET   LONG TERM GOALS: Target date: 11/29/2022   Pt will improve  FOTO function score to no less than 66% as proxy for functional improvement Baseline: 56% function Goal status: INITIAL    2.  Pt will self report left hip pain no greater than 3/10 for improved comfort and functional ability Baseline: 10/10 at worst Goal status: INITIAL    3.  Pt will increase 30 Second Sit to Stand rep count to no less than 9 reps for improved balance, strength, and functional mobility Baseline: 6 reps  Goal status: INITIAL    4.  Pt will be able to stand at least 30 minutes without increase in left hip pain for improved comfort and functional ability when performing home ADLs Baseline: 15 minutes Goal status: INITIAL   5.  Pt will increase left hip flex/abd to no less than 4/5 for improved functional mobility Baseline: see chart Goal status: INITIAL   PLAN:   PT FREQUENCY: 2x/week   PT DURATION: 8 weeks   PLANNED INTERVENTIONS: Therapeutic exercises, Therapeutic activity, Neuromuscular re-education, Balance training, Gait training, Patient/Family education, Self Care, Joint mobilization, Aquatic Therapy, Dry Needling, Electrical stimulation, Cryotherapy, Moist heat, Manual therapy, and Re-evaluation   PLAN FOR NEXT SESSION: assess HEP response, proximal hip strenghtening   Ward Chatters, PT 10/27/2022, 10:39 AM

## 2022-10-28 ENCOUNTER — Other Ambulatory Visit: Payer: Medicaid Other

## 2022-11-02 ENCOUNTER — Ambulatory Visit: Payer: Medicare Other

## 2022-11-02 DIAGNOSIS — M25552 Pain in left hip: Secondary | ICD-10-CM | POA: Diagnosis not present

## 2022-11-02 DIAGNOSIS — M6281 Muscle weakness (generalized): Secondary | ICD-10-CM

## 2022-11-02 DIAGNOSIS — R2689 Other abnormalities of gait and mobility: Secondary | ICD-10-CM

## 2022-11-02 NOTE — Therapy (Signed)
OUTPATIENT PHYSICAL THERAPY TREATMENT NOTE   Patient Name: Deanna Floyd MRN: [id number] DOB:24-May-1961, 61 y.o., female Today's Date: 11/02/2022  PCP: Ellery Plunk  REFERRING PROVIDER: Ellery Plunk   END OF SESSION:   PT End of Session - 11/02/22 1007     Visit Number 6    Number of Visits 17    Date for PT Re-Evaluation 11/29/22    Authorization Type [employer] Medicare    PT Start Time 1007    PT Stop Time 6811    PT Time Calculation (min) 38 min    Activity Tolerance Patient tolerated treatment well    Behavior During Therapy WFL for tasks assessed/performed                 Past Medical History:  Diagnosis Date   GERD (gastroesophageal reflux disease)    Hypertension    Stroke ([place]) 2007   Past Surgical History:  Procedure Laterality Date   FOOT SURGERY  2015   left side had 2 surgeries in 2015   [place]  2010   Patient Active Problem List   Diagnosis Date Noted   Axillary lump, left 09/17/2022   Solitary pulmonary nodule 06/17/2022   Generalized abdominal pain 11/12/2021   Loss of weight 11/12/2021   Right wrist pain 03/27/2020   Neuropathy 08/21/2019   Chronic foot pain, left 08/21/2019   Seasonal allergies 08/21/2019   Right upper quadrant pain 05/08/2019   Gastroesophageal reflux disease with esophagitis 05/08/2019   Hot flashes due to menopause 05/08/2019   Carpal tunnel syndrome on both sides 57/26/2035   Metabolic syndrome 59/74/1638   Essential hypertension 11/19/2016   Allergic dermatitis 11/19/2016   Allergy status to unspecified drugs, medicaments and biological substances status 11/19/2016   BP check 09/22/2016   Post-operative state 06/02/2016   Obesity 01/08/2016   Low back pain 10/06/2015   Pain in joint, ankle and foot 10/06/2015    REFERRING DIAG: M25.552 (ICD-10-CM) - Left hip pain    THERAPY DIAG:  Pain in left hip  Muscle weakness (generalized)  Other abnormalities of gait and  mobility  Rationale for Evaluation and Treatment Rehabilitation  PERTINENT HISTORY: CVA, HTN   PRECAUTIONS: None   SUBJECTIVE:                                                                                                                                                                                      SUBJECTIVE STATEMENT: Pt presents to PT with reports of continued L hip pain. Has been compliant with HEP with no adverse effect. Pt is ready to begin PT at this time.   PAIN:  Are you having pain?  Yes: NPRS scale: 5/10 Best: 6/10 Worst: 10/10 Pain location: left hip Pain description: sharp Aggravating factors: mornings, prolonged standing, walking Relieving factors: heat, medication   OBJECTIVE: (objective measures completed at initial evaluation unless otherwise dated)  DIAGNOSTIC FINDINGS:             See imaging    PATIENT SURVEYS:  FOTO: 56% function; 66% predicted    COGNITION: Overall cognitive status: Within functional limits for tasks assessed                         SENSATION: WFL   POSTURE: rounded shoulders and forward head   PALPATION: TTP to L glute medius, L piriformis, L trochanteric bursa   VITALS: BP: 134/84 - L arm in sitting   LOWER EXTREMITY MMT:   MMT Right eval Left eval  Hip flexion 4/5 3/5 p!  Hip extension      Hip abduction 4/5 3/5 p!  Hip adduction 4/5 3/5 p!  Hip internal rotation      Hip external rotation      Knee flexion      Knee extension      Ankle dorsiflexion      Ankle plantarflexion      Ankle inversion      Ankle eversion       (Blank rows = not tested)   LOWER EXTREMITY SPECIAL TESTS:  Hip special tests: Saralyn Pilar (FABER) test: positive  and Hip scouring test: positive    FUNCTIONAL TESTS:  30 Second Sit to Stand: 6 reps   GAIT: Distance walked: 90f Assistive device utilized: None Level of assistance: Complete Independence Comments: antalgic gait L     TREATMENT: [facility] Adult PT Treatment:                                                 DATE: 11/02/2022 Therapeutic Exercise: NuStep lvl 5 UE/LE x 4 min while taking subjective  Lateral walk at counter x 3 laps RTB  Standing hip abd/ext 2x10 RTB LTR x 10 each  Bridge 3x10  Seated fig 4 stretch 2x30" Supine SLR x 10 each S/L clamshell 2x15 GTB Supine pilates ring squeeze 2x10 - 3" hold STS 2x10 - no UE  [facility] Adult PT Treatment:                                                DATE: 10/27/2022 Therapeutic Exercise: NuStep lvl 5 UE/LE x 3 min while taking subjective  LTR x 10 each  Bridge 2x10  Supine SLR x 10 each Supine clamshell 3x15 BTB Supine marching BTB 2x10 BIL Supine ball squeeze 2x10 - 5" hold STS 2x10 - no UE Lateral walk at counter x 3 laps RTB   [facility] Adult PT Treatment:                                                DATE: 10/20/2022 Therapeutic Exercise: NuStep lvl 5 UE/LE x 3 min while taking subjective  Standing hip abduction/extension RTB at ankles 2x10 each  BIL LTR x 10 each  Bridge 2x10  Supine SLR x 10 each Seated clamshell 3x15 BTB Supine marching BTB 2x10 BIL Supine ball squeeze 2x10 - 5" hold STS x10 - no UE LAQ 2x10 3" hold at top (no ankle weight today due to ankle pain) Seated fig 4 x1' BIL  [facility] Adult PT Treatment:                                                DATE: 10/14/2022 Therapeutic Exercise: LTR x 10 each  Bridge 2x10  Supine SLR x 10 each Seated clamshell 3x15 BTB Seated ball squeeze 2x10 - 5" hold STS 2x10 - no UE LAQ 2x10 2.5# Standing hip abd/ext 2x10 each Seated fig 4 2x30" L  [facility] Adult PT Treatment:                                                DATE: 10/12/2022 Therapeutic Exercise: NuStep lvl 5 UE/LE x 3 min while taking subjective  LTR x 10 each  Bridge 2x10  Seated clamshell 3x10 BTB Seated ball squeeze 2x10 - 5" hold Supine SLR 2x10 STS 2x10 - no UE Standing hip abd/ext x 10 each Seated fig 4 2x30" L    PATIENT EDUCATION:  Education details: HEP update Person  educated: Patient Education method: Explanation, Demonstration, and Handouts Education comprehension: verbalized understanding and returned demonstration   HOME EXERCISE PROGRAM: Access Code: [email or web address] URL: [email or web address] Date: 10/27/2022 Prepared by: Octavio Manns  Exercises - Supine Lower Trunk Rotation  - 1 x daily - 7 x weekly - 2 sets - 10 reps - 5 sec hold - Supine Figure 4 Piriformis Stretch  - 1 x daily - 7 x weekly - 2 reps - 30 sec hold - Hooklying Clamshell with Resistance  - 1 x daily - 7 x weekly - 3 sets - 10 reps - green theraband hold - Active Straight Leg Raise with Quad Set  - 1 x daily - 7 x weekly - 2 sets - 10 reps - Supine Bridge  - 1 x daily - 7 x weekly - 2 sets - 10 reps - Seated Hip Adduction Isometrics with Ball  - 1 x daily - 7 x weekly - 2 sets - 10 reps - 5 sec hold - Standing Hip Abduction with Resistance at Ankles and Counter Support  - 1 x daily - 7 x weekly - 2 sets - 10 reps - red tband hold - Standing Hip Extension with Resistance at Ankles and Counter Support  - 1 x daily - 7 x weekly - 2 sets - 10 reps - red tband hold   ASSESSMENT:   CLINICAL IMPRESSION: Pt was able to complete all prescribed exercises with no adverse effect or increase in pain. Therapy today focused on improving proximal hip strength and functional mobility. She continues to benefit from skilled PT services and will continue to be seen and progressed as able per POC.   OBJECTIVE IMPAIRMENTS: decreased activity tolerance, decreased balance, decreased mobility, difficulty walking, decreased ROM, decreased strength, and pain.    ACTIVITY LIMITATIONS: carrying, lifting, standing, squatting, stairs, transfers, and locomotion level   PARTICIPATION LIMITATIONS: meal prep, cleaning, driving, shopping, community activity, and yard work  PERSONAL FACTORS: Fitness, Time since onset of injury/illness/exacerbation, and 1-2 comorbidities: CVA, HTN  are also affecting  patient's functional outcome.      GOALS: Goals reviewed with patient? No   SHORT TERM GOALS: Target date: 10/25/2022   Pt will be compliant and knowledgeable with initial HEP for improved comfort and carryover Baseline: initial HEP given  Goal status: MET   2.  Pt will self report left hip pain no greater than 7/10 for improved comfort and functional ability Baseline: 10/10 at worst Goal status: MET   LONG TERM GOALS: Target date: 11/29/2022   Pt will improve FOTO function score to no less than 66% as proxy for functional improvement Baseline: 56% function Goal status: INITIAL    2.  Pt will self report left hip pain no greater than 3/10 for improved comfort and functional ability Baseline: 10/10 at worst Goal status: INITIAL    3.  Pt will increase 30 Second Sit to Stand rep count to no less than 9 reps for improved balance, strength, and functional mobility Baseline: 6 reps  Goal status: INITIAL    4.  Pt will be able to stand at least 30 minutes without increase in left hip pain for improved comfort and functional ability when performing home ADLs Baseline: 15 minutes Goal status: INITIAL   5.  Pt will increase left hip flex/abd to no less than 4/5 for improved functional mobility Baseline: see chart Goal status: INITIAL   PLAN:   PT FREQUENCY: 2x/week   PT DURATION: 8 weeks   PLANNED INTERVENTIONS: Therapeutic exercises, Therapeutic activity, Neuromuscular re-education, Balance training, Gait training, Patient/Family education, Self Care, Joint mobilization, Aquatic Therapy, Dry Needling, Electrical stimulation, Cryotherapy, Moist heat, Manual therapy, and Re-evaluation   PLAN FOR NEXT SESSION: assess HEP response, proximal hip strenghtening   Ward Chatters, PT 11/02/2022, 10:49 AM

## 2022-11-03 ENCOUNTER — Ambulatory Visit: Payer: Medicare Other

## 2022-11-09 NOTE — Therapy (Incomplete)
OUTPATIENT PHYSICAL THERAPY TREATMENT NOTE   Patient Name: Deanna Floyd MRN: [id number] DOB:November 20, 1960, 62 y.o., female Today's Date: 11/10/2022  PCP: Ellery Plunk  REFERRING PROVIDER: Ellery Plunk   END OF SESSION:   PT End of Session - 11/10/22 1221     Visit Number 7    Number of Visits 17    Date for PT Re-Evaluation 11/29/22    Authorization Type [employer]    PT Start Time 1222   arrived late   PT Stop Time 1300    PT Time Calculation (min) 38 min    Activity Tolerance Patient tolerated treatment well    Behavior During Therapy WFL for tasks assessed/performed                  Past Medical History:  Diagnosis Date   GERD (gastroesophageal reflux disease)    Hypertension    Stroke ([place]) 2007   Past Surgical History:  Procedure Laterality Date   FOOT SURGERY  2015   left side had 2 surgeries in 2015   [place]  2010   Patient Active Problem List   Diagnosis Date Noted   Axillary lump, left 09/17/2022   Solitary pulmonary nodule 06/17/2022   Generalized abdominal pain 11/12/2021   Loss of weight 11/12/2021   Right wrist pain 03/27/2020   Neuropathy 08/21/2019   Chronic foot pain, left 08/21/2019   Seasonal allergies 08/21/2019   Right upper quadrant pain 05/08/2019   Gastroesophageal reflux disease with esophagitis 05/08/2019   Hot flashes due to menopause 05/08/2019   Carpal tunnel syndrome on both sides 93/79/0240   Metabolic syndrome 97/35/3299   Essential hypertension 11/19/2016   Allergic dermatitis 11/19/2016   Allergy status to unspecified drugs, medicaments and biological substances status 11/19/2016   BP check 09/22/2016   Post-operative state 06/02/2016   Obesity 01/08/2016   Low back pain 10/06/2015   Pain in joint, ankle and foot 10/06/2015    REFERRING DIAG: M25.552 (ICD-10-CM) - Left hip pain    THERAPY DIAG:  Pain in left hip  Muscle weakness (generalized)  Other abnormalities of gait and  mobility  Rationale for Evaluation and Treatment Rehabilitation  PERTINENT HISTORY: CVA, HTN   PRECAUTIONS: None   SUBJECTIVE:                                                                                                                                                                                      SUBJECTIVE STATEMENT: Pt presents to PT with continued hip pain. Has been compliant with HEP with no adverse effect. Ready to begin PT at this time.  PAIN:  Are  you having pain?  Yes: NPRS scale: 6/10 Best: 6/10 Worst: 10/10 Pain location: left hip Pain description: sharp Aggravating factors: mornings, prolonged standing, walking Relieving factors: heat, medication   OBJECTIVE: (objective measures completed at initial evaluation unless otherwise dated)  DIAGNOSTIC FINDINGS:             See imaging    PATIENT SURVEYS:  FOTO: 72% function; 66% predicted - 11/10/2022   COGNITION: Overall cognitive status: Within functional limits for tasks assessed                         SENSATION: WFL   POSTURE: rounded shoulders and forward head   PALPATION: TTP to L glute medius, L piriformis, L trochanteric bursa   VITALS: BP: 134/84 - L arm in sitting   LOWER EXTREMITY MMT:   MMT Right eval Left eval Right 11/10/2021 Left 11/10/2021  Hip flexion 4/5 3/5 p! 4/5 4/5  Hip extension        Hip abduction 4/5 3/5 p! 4/5 3+/5  Hip adduction 4/5 3/5 p! 4/5 4/5  Hip internal rotation        Hip external rotation        Knee flexion        Knee extension        Ankle dorsiflexion        Ankle plantarflexion        Ankle inversion        Ankle eversion         (Blank rows = not tested)   LOWER EXTREMITY SPECIAL TESTS:  Hip special tests: Saralyn Pilar (FABER) test: positive  and Hip scouring test: positive    FUNCTIONAL TESTS:  30 Second Sit to Stand: 10 reps - 11/10/2022   GAIT: Distance walked: 75f Assistive device utilized: None Level of assistance: Complete  Independence Comments: antalgic gait L     TREATMENT: [facility] Adult PT Treatment:                                                DATE: 11/10/2022 Therapeutic Exercise: NuStep lvl 5 UE/LE x 3 min while taking subjective  STS x 10 Bridge 2x10 Supine SLR x 15 each Seated pilates ring squeeze 2x10 - 3" hold Lateral walk at counter x 3 laps RTB  Standing hip abd/ext 2x10 RTB Leg press 2x10 35# S/L clamshell 2x15 GTB  [facility] Adult PT Treatment:                                                DATE: 11/02/2022 Therapeutic Exercise: NuStep lvl 5 UE/LE x 4 min while taking subjective  Lateral walk at counter x 3 laps RTB  Standing hip abd/ext 2x10 RTB LTR x 10 each  Bridge 3x10  Seated fig 4 stretch 2x30" Supine SLR x 10 each S/L clamshell 2x15 GTB Supine pilates ring squeeze 2x10 - 3" hold STS 2x10 - no UE  [facility] Adult PT Treatment:  DATE: 10/27/2022 Therapeutic Exercise: NuStep lvl 5 UE/LE x 3 min while taking subjective  LTR x 10 each  Bridge 2x10  Supine SLR x 10 each Supine clamshell 3x15 BTB Supine marching BTB 2x10 BIL Supine ball squeeze 2x10 - 5" hold STS 2x10 - no UE Lateral walk at counter x 3 laps RTB   PATIENT EDUCATION:  Education details: HEP update Person educated: Patient Education method: Explanation, Demonstration, and Handouts Education comprehension: verbalized understanding and returned demonstration   HOME EXERCISE PROGRAM: Access Code: [email or web address] URL: [email or web address] Date: 10/27/2022 Prepared by: Octavio Manns  Exercises - Supine Lower Trunk Rotation  - 1 x daily - 7 x weekly - 2 sets - 10 reps - 5 sec hold - Supine Figure 4 Piriformis Stretch  - 1 x daily - 7 x weekly - 2 reps - 30 sec hold - Hooklying Clamshell with Resistance  - 1 x daily - 7 x weekly - 3 sets - 10 reps - green theraband hold - Active Straight Leg Raise with Quad Set  - 1 x daily - 7 x weekly - 2 sets - 10 reps - Supine  Bridge  - 1 x daily - 7 x weekly - 2 sets - 10 reps - Seated Hip Adduction Isometrics with Ball  - 1 x daily - 7 x weekly - 2 sets - 10 reps - 5 sec hold - Standing Hip Abduction with Resistance at Ankles and Counter Support  - 1 x daily - 7 x weekly - 2 sets - 10 reps - red tband hold - Standing Hip Extension with Resistance at Ankles and Counter Support  - 1 x daily - 7 x weekly - 2 sets - 10 reps - red tband hold   ASSESSMENT:   CLINICAL IMPRESSION: Pt was able to complete all prescribed exercises with no adverse effect or increase in pain. Therapy today focused on improving proximal hip strength and functional mobility. Over the course of PT she has progressed very well, showing improvement in subjective functional ability via FOTO and met her 30 Second Sit to Stand. She continues to have L hip pain and discomfort as well as continued LE weakness. Pt would continue to benefit form skilled PT and will be seen per POC.   OBJECTIVE IMPAIRMENTS: decreased activity tolerance, decreased balance, decreased mobility, difficulty walking, decreased ROM, decreased strength, and pain.    ACTIVITY LIMITATIONS: carrying, lifting, standing, squatting, stairs, transfers, and locomotion level   PARTICIPATION LIMITATIONS: meal prep, cleaning, driving, shopping, community activity, and yard work   PERSONAL FACTORS: Fitness, Time since onset of injury/illness/exacerbation, and 1-2 comorbidities: CVA, HTN  are also affecting patient's functional outcome.      GOALS: Goals reviewed with patient? No   SHORT TERM GOALS: Target date: 10/25/2022   Pt will be compliant and knowledgeable with initial HEP for improved comfort and carryover Baseline: initial HEP given  Goal status: MET   2.  Pt will self report left hip pain no greater than 7/10 for improved comfort and functional ability Baseline: 10/10 at worst Goal status: MET   LONG TERM GOALS: Target date: 11/29/2022   Pt will improve FOTO function score  to no less than 66% as proxy for functional improvement Baseline: 56% function 11/10/2022: 72% function Goal status: INITIAL    2.  Pt will self report left hip pain no greater than 3/10 for improved comfort and functional ability Baseline: 10/10 at worst 11/10/2022: 6/10 at wrost Goal status: ONGOING   3.  Pt will increase 30 Second Sit to Stand rep count to no less than 9 reps for improved balance, strength, and functional mobility Baseline: 6 reps  11/10/2021: 10 reps Goal status: MET   4.  Pt will be able to stand at least 30 minutes without increase in left hip pain for improved comfort and functional ability when performing home ADLs Baseline: 15 minutes 11/10/2022: 15 minutes Goal status: ONGOING    5.  Pt will increase left hip flex/abd to no less than 4/5 for improved functional mobility Baseline: see chart Goal status: ONGOING    PLAN:   PT FREQUENCY: 2x/week   PT DURATION: 4 weeks   PLANNED INTERVENTIONS: Therapeutic exercises, Therapeutic activity, Neuromuscular re-education, Balance training, Gait training, Patient/Family education, Self Care, Joint mobilization, Aquatic Therapy, Dry Needling, Electrical stimulation, Cryotherapy, Moist heat, Manual therapy, and Re-evaluation   PLAN FOR NEXT SESSION: assess HEP response, proximal hip strenghtening   Ward Chatters, PT 11/10/2022, 1:43 PM

## 2022-11-10 ENCOUNTER — Ambulatory Visit: Payer: 59 | Attending: Nurse Practitioner

## 2022-11-10 DIAGNOSIS — M25552 Pain in left hip: Secondary | ICD-10-CM

## 2022-11-10 DIAGNOSIS — R2689 Other abnormalities of gait and mobility: Secondary | ICD-10-CM

## 2022-11-10 DIAGNOSIS — M6281 Muscle weakness (generalized): Secondary | ICD-10-CM

## 2022-11-15 NOTE — Therapy (Incomplete)
OUTPATIENT PHYSICAL THERAPY TREATMENT NOTE   Patient Name: Deanna Floyd MRN: [id number] DOB:June 29, 1961, 62 y.o., female Today's Date: 11/15/2022  PCP: Ellery Plunk  REFERRING PROVIDER: Ellery Plunk   END OF SESSION:          Past Medical History:  Diagnosis Date   GERD (gastroesophageal reflux disease)    Hypertension    Stroke ([place]) 2007   Past Surgical History:  Procedure Laterality Date   FOOT SURGERY  2015   left side had 2 surgeries in 2015   [place]  2010   Patient Active Problem List   Diagnosis Date Noted   Axillary lump, left 09/17/2022   Solitary pulmonary nodule 06/17/2022   Generalized abdominal pain 11/12/2021   Loss of weight 11/12/2021   Right wrist pain 03/27/2020   Neuropathy 08/21/2019   Chronic foot pain, left 08/21/2019   Seasonal allergies 08/21/2019   Right upper quadrant pain 05/08/2019   Gastroesophageal reflux disease with esophagitis 05/08/2019   Hot flashes due to menopause 05/08/2019   Carpal tunnel syndrome on both sides 40/98/1191   Metabolic syndrome 47/82/9562   Essential hypertension 11/19/2016   Allergic dermatitis 11/19/2016   Allergy status to unspecified drugs, medicaments and biological substances status 11/19/2016   BP check 09/22/2016   Post-operative state 06/02/2016   Obesity 01/08/2016   Low back pain 10/06/2015   Pain in joint, ankle and foot 10/06/2015    REFERRING DIAG: M25.552 (ICD-10-CM) - Left hip pain    THERAPY DIAG:  No diagnosis found.  Rationale for Evaluation and Treatment Rehabilitation  PERTINENT HISTORY: CVA, HTN   PRECAUTIONS: None   SUBJECTIVE:                                                                                                                                                                                      SUBJECTIVE STATEMENT: ***  PAIN:  Are you having pain?  Yes: NPRS scale: 6/10 Best: 6/10 Worst: 10/10 Pain location: left hip Pain  description: sharp Aggravating factors: mornings, prolonged standing, walking Relieving factors: heat, medication   OBJECTIVE: (objective measures completed at initial evaluation unless otherwise dated)  DIAGNOSTIC FINDINGS:             See imaging    PATIENT SURVEYS:  FOTO: 72% function; 66% predicted - 11/10/2022   COGNITION: Overall cognitive status: Within functional limits for tasks assessed                         SENSATION: WFL   POSTURE: rounded shoulders and forward head   PALPATION: TTP to L glute medius, L  piriformis, L trochanteric bursa   VITALS: BP: 134/84 - L arm in sitting   LOWER EXTREMITY MMT:   MMT Right eval Left eval Right 11/10/2021 Left 11/10/2021  Hip flexion 4/5 3/5 p! 4/5 4/5  Hip extension        Hip abduction 4/5 3/5 p! 4/5 3+/5  Hip adduction 4/5 3/5 p! 4/5 4/5  Hip internal rotation        Hip external rotation        Knee flexion        Knee extension        Ankle dorsiflexion        Ankle plantarflexion        Ankle inversion        Ankle eversion         (Blank rows = not tested)   LOWER EXTREMITY SPECIAL TESTS:  Hip special tests: Saralyn Pilar (FABER) test: positive  and Hip scouring test: positive    FUNCTIONAL TESTS:  30 Second Sit to Stand: 10 reps - 11/10/2022   GAIT: Distance walked: 27f Assistive device utilized: None Level of assistance: Complete Independence Comments: antalgic gait L     TREATMENT: [facility] Adult PT Treatment:                                                DATE: 11/16/2022 Therapeutic Exercise: NuStep lvl 5 UE/LE x 3 min while taking subjective  STS x 10 Bridge 2x10 Supine SLR x 15 each Seated pilates ring squeeze 2x10 - 3" hold Lateral walk at counter x 3 laps RTB  Standing hip abd/ext 2x10 RTB Leg press 2x10 35# S/L clamshell 2x15 GTB  [facility] Adult PT Treatment:                                                DATE: 11/10/2022 Therapeutic Exercise: NuStep lvl 5 UE/LE x 3 min while taking subjective  STS  x 10 Bridge 2x10 Supine SLR x 15 each Seated pilates ring squeeze 2x10 - 3" hold Lateral walk at counter x 3 laps RTB  Standing hip abd/ext 2x10 RTB Leg press 2x10 35# S/L clamshell 2x15 GTB  [facility] Adult PT Treatment:                                                DATE: 11/02/2022 Therapeutic Exercise: NuStep lvl 5 UE/LE x 4 min while taking subjective  Lateral walk at counter x 3 laps RTB  Standing hip abd/ext 2x10 RTB LTR x 10 each  Bridge 3x10  Seated fig 4 stretch 2x30" Supine SLR x 10 each S/L clamshell 2x15 GTB Supine pilates ring squeeze 2x10 - 3" hold STS 2x10 - no UE  [facility] Adult PT Treatment:                                                DATE: 10/27/2022 Therapeutic Exercise: NuStep lvl 5 UE/LE x 3 min while taking subjective  LTR x 10 each  Bridge 2x10  Supine SLR x 10 each Supine clamshell 3x15 BTB Supine marching BTB 2x10 BIL Supine ball squeeze 2x10 - 5" hold STS 2x10 - no UE Lateral walk at counter x 3 laps RTB   PATIENT EDUCATION:  Education details: HEP update Person educated: Patient Education method: Explanation, Demonstration, and Handouts Education comprehension: verbalized understanding and returned demonstration   HOME EXERCISE PROGRAM: Access Code: [email or web address] URL: [email or web address] Date: 10/27/2022 Prepared by: Octavio Manns  Exercises - Supine Lower Trunk Rotation  - 1 x daily - 7 x weekly - 2 sets - 10 reps - 5 sec hold - Supine Figure 4 Piriformis Stretch  - 1 x daily - 7 x weekly - 2 reps - 30 sec hold - Hooklying Clamshell with Resistance  - 1 x daily - 7 x weekly - 3 sets - 10 reps - green theraband hold - Active Straight Leg Raise with Quad Set  - 1 x daily - 7 x weekly - 2 sets - 10 reps - Supine Bridge  - 1 x daily - 7 x weekly - 2 sets - 10 reps - Seated Hip Adduction Isometrics with Ball  - 1 x daily - 7 x weekly - 2 sets - 10 reps - 5 sec hold - Standing Hip Abduction with Resistance at Ankles and Counter Support   - 1 x daily - 7 x weekly - 2 sets - 10 reps - red tband hold - Standing Hip Extension with Resistance at Ankles and Counter Support  - 1 x daily - 7 x weekly - 2 sets - 10 reps - red tband hold   ASSESSMENT:   CLINICAL IMPRESSION: ***  OBJECTIVE IMPAIRMENTS: decreased activity tolerance, decreased balance, decreased mobility, difficulty walking, decreased ROM, decreased strength, and pain.    ACTIVITY LIMITATIONS: carrying, lifting, standing, squatting, stairs, transfers, and locomotion level   PARTICIPATION LIMITATIONS: meal prep, cleaning, driving, shopping, community activity, and yard work   PERSONAL FACTORS: Fitness, Time since onset of injury/illness/exacerbation, and 1-2 comorbidities: CVA, HTN  are also affecting patient's functional outcome.      GOALS: Goals reviewed with patient? No   SHORT TERM GOALS: Target date: 10/25/2022   Pt will be compliant and knowledgeable with initial HEP for improved comfort and carryover Baseline: initial HEP given  Goal status: MET   2.  Pt will self report left hip pain no greater than 7/10 for improved comfort and functional ability Baseline: 10/10 at worst Goal status: MET   LONG TERM GOALS: Target date: 11/29/2022   Pt will improve FOTO function score to no less than 66% as proxy for functional improvement Baseline: 56% function 11/10/2022: 72% function Goal status: INITIAL    2.  Pt will self report left hip pain no greater than 3/10 for improved comfort and functional ability Baseline: 10/10 at worst 11/10/2022: 6/10 at wrost Goal status: ONGOING   3.  Pt will increase 30 Second Sit to Stand rep count to no less than 9 reps for improved balance, strength, and functional mobility Baseline: 6 reps  11/10/2021: 10 reps Goal status: MET   4.  Pt will be able to stand at least 30 minutes without increase in left hip pain for improved comfort and functional ability when performing home ADLs Baseline: 15 minutes 11/10/2022: 15  minutes Goal status: ONGOING    5.  Pt will increase left hip flex/abd to no less than 4/5 for improved functional mobility Baseline: see chart Goal status:  ONGOING    PLAN:   PT FREQUENCY: 2x/week   PT DURATION: 4 weeks   PLANNED INTERVENTIONS: Therapeutic exercises, Therapeutic activity, Neuromuscular re-education, Balance training, Gait training, Patient/Family education, Self Care, Joint mobilization, Aquatic Therapy, Dry Needling, Electrical stimulation, Cryotherapy, Moist heat, Manual therapy, and Re-evaluation   PLAN FOR NEXT SESSION: assess HEP response, proximal hip strenghtening   Ward Chatters, PT 11/15/2022, 3:04 PM

## 2022-11-16 ENCOUNTER — Ambulatory Visit: Payer: 59

## 2022-11-16 DIAGNOSIS — Z1231 Encounter for screening mammogram for malignant neoplasm of breast: Secondary | ICD-10-CM

## 2022-11-17 NOTE — Therapy (Incomplete)
OUTPATIENT PHYSICAL THERAPY TREATMENT NOTE   Patient Name: Deanna Floyd MRN: [id number] DOB:[birth year], 62 y.o., female Today's Date: 11/17/2022  PCP: Ellery Plunk  REFERRING PROVIDER: Ellery Plunk   END OF SESSION:          Past Medical History:  Diagnosis Date   GERD (gastroesophageal reflux disease)    Hypertension    Stroke ([place]) 2007   Past Surgical History:  Procedure Laterality Date   FOOT SURGERY  2015   left side had 2 surgeries in 2015   [place]  2010   Patient Active Problem List   Diagnosis Date Noted   Axillary lump, left 09/17/2022   Solitary pulmonary nodule 06/17/2022   Generalized abdominal pain 11/12/2021   Loss of weight 11/12/2021   Right wrist pain 03/27/2020   Neuropathy 08/21/2019   Chronic foot pain, left 08/21/2019   Seasonal allergies 08/21/2019   Right upper quadrant pain 05/08/2019   Gastroesophageal reflux disease with esophagitis 05/08/2019   Hot flashes due to menopause 05/08/2019   Carpal tunnel syndrome on both sides 62/22/9798   Metabolic syndrome 92/09/9416   Essential hypertension 11/19/2016   Allergic dermatitis 11/19/2016   Allergy status to unspecified drugs, medicaments and biological substances status 11/19/2016   BP check 09/22/2016   Post-operative state 06/02/2016   Obesity 01/08/2016   Low back pain 10/06/2015   Pain in joint, ankle and foot 10/06/2015    REFERRING DIAG: M25.552 (ICD-10-CM) - Left hip pain    THERAPY DIAG:  No diagnosis found.  Rationale for Evaluation and Treatment Rehabilitation  PERTINENT HISTORY: CVA, HTN   PRECAUTIONS: None   SUBJECTIVE:                                                                                                                                                                                      SUBJECTIVE STATEMENT: ***  PAIN:  Are you having pain?  Yes: NPRS scale: 6/10 Best: 6/10 Worst: 10/10 Pain location: left hip Pain  description: sharp Aggravating factors: mornings, prolonged standing, walking Relieving factors: heat, medication   OBJECTIVE: (objective measures completed at initial evaluation unless otherwise dated)  DIAGNOSTIC FINDINGS:             See imaging    PATIENT SURVEYS:  FOTO: 72% function; 66% predicted - 11/10/2022   COGNITION: Overall cognitive status: Within functional limits for tasks assessed                         SENSATION: WFL   POSTURE: rounded shoulders and forward head   PALPATION: TTP to L glute medius, L  piriformis, L trochanteric bursa   VITALS: BP: 134/84 - L arm in sitting   LOWER EXTREMITY MMT:   MMT Right eval Left eval Right 11/10/2021 Left 11/10/2021  Hip flexion 4/5 3/5 p! 4/5 4/5  Hip extension        Hip abduction 4/5 3/5 p! 4/5 3+/5  Hip adduction 4/5 3/5 p! 4/5 4/5  Hip internal rotation        Hip external rotation        Knee flexion        Knee extension        Ankle dorsiflexion        Ankle plantarflexion        Ankle inversion        Ankle eversion         (Blank rows = not tested)   LOWER EXTREMITY SPECIAL TESTS:  Hip special tests: Saralyn Pilar (FABER) test: positive  and Hip scouring test: positive    FUNCTIONAL TESTS:  30 Second Sit to Stand: 10 reps - 11/10/2022   GAIT: Distance walked: 5f Assistive device utilized: None Level of assistance: Complete Independence Comments: antalgic gait L     TREATMENT: [facility] Adult PT Treatment:                                                DATE: 11/18/2022 Therapeutic Exercise: NuStep lvl 5 UE/LE x 3 min while taking subjective  STS x 10 Bridge 2x10 Supine SLR x 15 each Seated pilates ring squeeze 2x10 - 3" hold Lateral walk at counter x 3 laps RTB  Standing hip abd/ext 2x10 RTB Leg press 2x10 35# S/L clamshell 2x15 GTB  [facility] Adult PT Treatment:                                                DATE: 11/10/2022 Therapeutic Exercise: NuStep lvl 5 UE/LE x 3 min while taking subjective  STS  x 10 Bridge 2x10 Supine SLR x 15 each Seated pilates ring squeeze 2x10 - 3" hold Lateral walk at counter x 3 laps RTB  Standing hip abd/ext 2x10 RTB Leg press 2x10 35# S/L clamshell 2x15 GTB  [facility] Adult PT Treatment:                                                DATE: 11/02/2022 Therapeutic Exercise: NuStep lvl 5 UE/LE x 4 min while taking subjective  Lateral walk at counter x 3 laps RTB  Standing hip abd/ext 2x10 RTB LTR x 10 each  Bridge 3x10  Seated fig 4 stretch 2x30" Supine SLR x 10 each S/L clamshell 2x15 GTB Supine pilates ring squeeze 2x10 - 3" hold STS 2x10 - no UE  [facility] Adult PT Treatment:                                                DATE: 10/27/2022 Therapeutic Exercise: NuStep lvl 5 UE/LE x 3 min while taking subjective  LTR x 10 each  Bridge 2x10  Supine SLR x 10 each Supine clamshell 3x15 BTB Supine marching BTB 2x10 BIL Supine ball squeeze 2x10 - 5" hold STS 2x10 - no UE Lateral walk at counter x 3 laps RTB   PATIENT EDUCATION:  Education details: HEP update Person educated: Patient Education method: Explanation, Demonstration, and Handouts Education comprehension: verbalized understanding and returned demonstration   HOME EXERCISE PROGRAM: Access Code: [email or web address] URL: [email or web address] Date: 10/27/2022 Prepared by: Octavio Manns  Exercises - Supine Lower Trunk Rotation  - 1 x daily - 7 x weekly - 2 sets - 10 reps - 5 sec hold - Supine Figure 4 Piriformis Stretch  - 1 x daily - 7 x weekly - 2 reps - 30 sec hold - Hooklying Clamshell with Resistance  - 1 x daily - 7 x weekly - 3 sets - 10 reps - green theraband hold - Active Straight Leg Raise with Quad Set  - 1 x daily - 7 x weekly - 2 sets - 10 reps - Supine Bridge  - 1 x daily - 7 x weekly - 2 sets - 10 reps - Seated Hip Adduction Isometrics with Ball  - 1 x daily - 7 x weekly - 2 sets - 10 reps - 5 sec hold - Standing Hip Abduction with Resistance at Ankles and Counter Support   - 1 x daily - 7 x weekly - 2 sets - 10 reps - red tband hold - Standing Hip Extension with Resistance at Ankles and Counter Support  - 1 x daily - 7 x weekly - 2 sets - 10 reps - red tband hold   ASSESSMENT:   CLINICAL IMPRESSION: ***  OBJECTIVE IMPAIRMENTS: decreased activity tolerance, decreased balance, decreased mobility, difficulty walking, decreased ROM, decreased strength, and pain.    ACTIVITY LIMITATIONS: carrying, lifting, standing, squatting, stairs, transfers, and locomotion level   PARTICIPATION LIMITATIONS: meal prep, cleaning, driving, shopping, community activity, and yard work   PERSONAL FACTORS: Fitness, Time since onset of injury/illness/exacerbation, and 1-2 comorbidities: CVA, HTN  are also affecting patient's functional outcome.      GOALS: Goals reviewed with patient? No   SHORT TERM GOALS: Target date: 10/25/2022   Pt will be compliant and knowledgeable with initial HEP for improved comfort and carryover Baseline: initial HEP given  Goal status: MET   2.  Pt will self report left hip pain no greater than 7/10 for improved comfort and functional ability Baseline: 10/10 at worst Goal status: MET   LONG TERM GOALS: Target date: 11/29/2022   Pt will improve FOTO function score to no less than 66% as proxy for functional improvement Baseline: 56% function 11/10/2022: 72% function Goal status: INITIAL    2.  Pt will self report left hip pain no greater than 3/10 for improved comfort and functional ability Baseline: 10/10 at worst 11/10/2022: 6/10 at wrost Goal status: ONGOING   3.  Pt will increase 30 Second Sit to Stand rep count to no less than 9 reps for improved balance, strength, and functional mobility Baseline: 6 reps  11/10/2021: 10 reps Goal status: MET   4.  Pt will be able to stand at least 30 minutes without increase in left hip pain for improved comfort and functional ability when performing home ADLs Baseline: 15 minutes 11/10/2022: 15  minutes Goal status: ONGOING    5.  Pt will increase left hip flex/abd to no less than 4/5 for improved functional mobility Baseline: see chart Goal status:  ONGOING    PLAN:   PT FREQUENCY: 2x/week   PT DURATION: 4 weeks   PLANNED INTERVENTIONS: Therapeutic exercises, Therapeutic activity, Neuromuscular re-education, Balance training, Gait training, Patient/Family education, Self Care, Joint mobilization, Aquatic Therapy, Dry Needling, Electrical stimulation, Cryotherapy, Moist heat, Manual therapy, and Re-evaluation   PLAN FOR NEXT SESSION: assess HEP response, proximal hip strenghtening   Ward Chatters, PT 11/17/2022, 9:33 AM

## 2022-11-18 ENCOUNTER — Ambulatory Visit: Payer: Medicaid Other

## 2022-11-18 ENCOUNTER — Other Ambulatory Visit: Payer: Self-pay | Admitting: *Deleted

## 2022-11-18 ENCOUNTER — Ambulatory Visit: Payer: 59

## 2022-11-18 VITALS — Wt 209.0 lb

## 2022-11-18 DIAGNOSIS — Z0000 Encounter for general adult medical examination without abnormal findings: Secondary | ICD-10-CM | POA: Diagnosis not present

## 2022-11-18 DIAGNOSIS — Z122 Encounter for screening for malignant neoplasm of respiratory organs: Secondary | ICD-10-CM

## 2022-11-18 NOTE — Progress Notes (Signed)
Subjective:   Deanna Floyd is a 62 y.o. female who presents for Medicare Annual (Subsequent) preventive examination.  I connected with  Deanna Floyd on 11/18/22 by a audio enabled telemedicine application and verified that I am speaking with the correct person using two identifiers.  Patient Location: Home  Provider Location: Office/Clinic  I discussed the limitations of evaluation and management by telemedicine. The patient expressed understanding and agreed to proceed.   Review of Systems     Deanna Floyd , Thank you for taking time to come for your Medicare Wellness Visit. I appreciate your ongoing commitment to your health goals. Please review the following plan we discussed and let me know if I can assist you in the future.   These are the goals we discussed:  Goals      Patient Stated     Would like to move away from where she lives a little change of pace would be nice.  Trying to keep herself occupied        This is a list of the screening recommended for you and due dates:  Health Maintenance  Topic Date Due   DTaP/Tdap/Td vaccine (1 - Tdap) Never done   Zoster (Shingles) Vaccine (1 of 2) Never done   Pap Smear  08/11/2019   COVID-19 Vaccine (4 - 2023-24 season) 07/09/2022   Flu Shot  02/06/2023*   Colon Cancer Screening  12/05/2022   Medicare Annual Wellness Visit  11/19/2023   Mammogram  05/04/2024   Hepatitis C Screening: USPSTF Recommendation to screen - Ages 18-79 yo.  Completed   HIV Screening  Completed   HPV Vaccine  Aged Out  *Topic was postponed. The date shown is not the original due date.    Cardiac Risk Factors include: hypertension;obesity (BMI >30kg/m2)     Objective:    Today's Vitals   11/18/22 0617  Weight: 209 lb (94.8 kg)   Body mass index is 38.85 kg/m.     10/04/2022    9:22 AM 06/02/2022    3:52 PM 08/06/2021   10:07 AM 07/15/2020    3:09 PM 08/10/2016    3:13 PM 06/07/2016    3:39 PM 10/06/2015   11:21 AM  Advanced  Directives  Does Patient Have a Medical Advance Directive? No No No No No No No  Would patient like information on creating a medical advance directive? No - Patient declined  Yes (MAU/Ambulatory/Procedural Areas - Information given)  No - patient declined information No - patient declined information No - patient declined information    Current Medications (verified) Outpatient Encounter Medications as of 11/18/2022  Medication Sig   acetaminophen (TYLENOL) 500 MG tablet Take 1 tablet (500 mg total) by mouth every 6 (six) hours as needed.   amLODipine (NORVASC) 5 MG tablet Take 1 tablet (5 mg total) by mouth daily.   cyclobenzaprine (FLEXERIL) 10 MG tablet Take 1 tablet (10 mg total) by mouth 3 (three) times daily as needed for muscle spasms.   diclofenac Sodium (VOLTAREN) 1 % GEL Apply 4 g topically 4 (four) times daily.   diphenhydrAMINE (BENADRYL) 2 % cream Apply topically 3 (three) times daily as needed for itching.   gabapentin (NEURONTIN) 300 MG capsule Take 1 capsule (300 mg total) by mouth 2 (two) times daily. As needed.   ibuprofen (ADVIL) 800 MG tablet Take 1 tablet (800 mg total) by mouth every 8 (eight) hours as needed.   ibuprofen (ADVIL) 800 MG tablet Take 1 tablet (800 mg  total) by mouth every 8 (eight) hours as needed.   Latanoprostene Bunod (VYZULTA) 0.024 % SOLN Place 1 drop into both eyes every evening.   Latanoprostene Bunod (VYZULTA) 0.024 % SOLN Place 1 drop into both eyes every evening.   lisinopril-hydrochlorothiazide (ZESTORETIC) 20-12.5 MG tablet TAKE 1 TABLET BY MOUTH DAILY.   meloxicam (MOBIC) 15 MG tablet Take 1 tablet (15 mg total) by mouth daily.   Multiple Vitamin (MULTIVITAMIN WITH MINERALS) TABS tablet Take 1 tablet by mouth daily.   mupirocin ointment (BACTROBAN) 2 % Apply 1 Application topically 2 (two) times daily.   Olopatadine HCl 0.2 % SOLN Place 1 drop into both eyes daily. One drop into each eye daily, as needed.   omeprazole (PRILOSEC) 40 MG capsule  Take 1 capsule (40 mg total) by mouth daily.   No facility-administered encounter medications on file as of 11/18/2022.    Allergies (verified) Hydrocortisone and Lyrica [pregabalin]   History: Past Medical History:  Diagnosis Date   GERD (gastroesophageal reflux disease)    Hypertension    Stroke ([place]) 2007   Past Surgical History:  Procedure Laterality Date   FOOT SURGERY  2015   left side had 2 surgeries in 2015   [place]  2010   Family History  Problem Relation Age of Onset   Hypertension Mother    Hypertension Sister    Breast cancer Sister 44   Colon cancer Neg Hx    Stomach cancer Neg Hx    Social History   Socioeconomic History   Marital status: Single    Spouse name: Not on file   Number of children: Not on file   Years of education: Not on file   Highest education level: Not on file  Occupational History   Not on file  Tobacco Use   Smoking status: Former    Packs/day: 0.25    Years: 10.00    Total pack years: 2.50    Types: Cigarettes    Quit date: 2012    Years since quitting: 12.0   Smokeless tobacco: Never  Vaping Use   Vaping Use: Never used  Substance and Sexual Activity   Alcohol use: Yes    Comment: occ wine    Drug use: No   Sexual activity: Not Currently  Other Topics Concern   Not on file  Social History Narrative   Not on file   Social Determinants of Health   Financial Resource Strain: Low Risk  (08/06/2021)   Overall Financial Resource Strain (CARDIA)    Difficulty of Paying Living Expenses: Not very hard  Food Insecurity: No Food Insecurity (08/06/2021)   Hunger Vital Sign    Worried About Running Out of Food in the Last Year: Never true    Ran Out of Food in the Last Year: Never true  Transportation Needs: No Transportation Needs (08/06/2021)   PRAPARE - Hydrologist (Medical): No    Lack of Transportation (Non-Medical): No  Physical Activity: Insufficiently Active (08/06/2021)    Exercise Vital Sign    Days of Exercise per Week: 1 day    Minutes of Exercise per Session: 20 min  Stress: No Stress Concern Present (08/06/2021)   [place]    Feeling of Stress : Only a little  Social Connections: Moderately Integrated (08/06/2021)   Social Connection and Isolation Panel [NHANES]    Frequency of Communication with Friends and Family: More than three times  a week    Frequency of Social Gatherings with Friends and Family: More than three times a week    Attends Religious Services: More than 4 times per year    Active Member of [employer] or Organizations: Yes    Attends Music therapist: More than 4 times per year    Marital Status: Never married    Tobacco Counseling Counseling given: Not Answered   Clinical Intake:  Pre-visit preparation completed: Yes        Diabetes: No  How often do you need to have someone help you when you read instructions, pamphlets, or other written materials from your doctor or pharmacy?: 1 - Never  Diabetic?no  Interpreter Needed?: No  Information entered by :: Amy Hopkins, LPN   Activities of Daily Living    11/18/2022    6:20 AM  In your present state of health, do you have any difficulty performing the following activities:  Hearing? 0  Vision? 0  Difficulty concentrating or making decisions? 0  Walking or climbing stairs? 0  Dressing or bathing? 0  Doing errands, shopping? 0  Preparing Food and eating ? N  Using the Toilet? N  In the past six months, have you accidently leaked urine? N  Do you have problems with loss of bowel control? N  Managing your Medications? N  Managing your Finances? N  Housekeeping or managing your Housekeeping? N    Patient Care Team: Fenton Foy, NP as PCP - General (Pulmonary Disease)  Indicate any recent Medical Services you may have received from other than [facility] providers in the past year (date may  be approximate).     Assessment:   This is a routine wellness examination for Reika.  Hearing/Vision screen No results found.  Dietary issues and exercise activities discussed:     Goals Addressed   None   Depression Screen    09/17/2022    1:19 PM 03/09/2022   10:26 AM 12/21/2021   11:11 AM 08/06/2021   10:09 AM 07/02/2021    3:31 PM 07/04/2020   11:48 AM 05/08/2019   11:27 AM  PHQ 2/9 Scores  PHQ - 2 Score 0 0 0 0 0 0 0  PHQ- 9 Score 0 4       Exception Documentation      Medical reason     Fall Risk    06/17/2022   11:54 AM 03/09/2022   10:25 AM 12/21/2021   11:11 AM 08/06/2021   10:08 AM 07/02/2021    3:30 PM  Fall Risk   Falls in the past year? 1 1 0 1 1  Number falls in past yr: 0 0 0 1 1  Injury with Fall? 1 1 0 1 1  Comment  pt stated she hit her rt knee     Risk for fall due to : No Fall Risks No Fall Risks  History of fall(s)   Follow up Falls evaluation completed Falls evaluation completed  Falls evaluation completed     FALL RISK PREVENTION PERTAINING TO THE HOME:  Any stairs in or around the home? Yes  If so, are there any without handrails? Yes  Home free of loose throw rugs in walkways, pet beds, electrical cords, etc? Yes  Adequate lighting in your home to reduce risk of falls? Yes   ASSISTIVE DEVICES UTILIZED TO PREVENT FALLS:  Life alert? No  Use of a cane, walker or w/c? No  Grab bars in the bathroom? Yes  Shower chair or bench in shower? No  Elevated toilet seat or a handicapped toilet? No    Cognitive Function:        08/06/2021   10:12 AM  6CIT Screen  What Year? 0 points  What month? 0 points  What time? 0 points  Count Floyd from 20 0 points  Months in reverse 0 points  Repeat phrase 0 points  Total Score 0 points    Immunizations Immunization History  Administered Date(s) Administered   Influenza,inj,Quad PF,6+ Mos 10/06/2015, 08/06/2016, 07/04/2017, 08/01/2018, 08/21/2019, 12/21/2021   PFIZER(Purple Top)SARS-COV-2  Vaccination 04/12/2020, 05/03/2020, 04/08/2021    TDAP status: Due, Education has been provided regarding the importance of this vaccine. Advised may receive this vaccine at local pharmacy or Health Dept. Aware to provide a copy of the vaccination record if obtained from local pharmacy or Health Dept. Verbalized acceptance and understanding.  Flu Vaccine status: Up to date  Covid-19 vaccine status: Completed vaccines  Qualifies for Shingles Vaccine? Yes   Zostavax completed Yes   Shingrix Completed?: Yes  Screening Tests Health Maintenance  Topic Date Due   DTaP/Tdap/Td (1 - Tdap) Never done   Zoster Vaccines- Shingrix (1 of 2) Never done   PAP SMEAR-Modifier  08/11/2019   COVID-19 Vaccine (4 - 2023-24 season) 07/09/2022   INFLUENZA VACCINE  02/06/2023 (Originally 06/08/2022)   COLONOSCOPY (Pts 45-33yr Insurance coverage will need to be confirmed)  12/05/2022   Medicare Annual Wellness (AWV)  11/19/2023   MAMMOGRAM  05/04/2024   Hepatitis C Screening  Completed   HIV Screening  Completed   HPV VACCINES  Aged Out    Health Maintenance  Health Maintenance Due  Topic Date Due   DTaP/Tdap/Td (1 - Tdap) Never done   Zoster Vaccines- Shingrix (1 of 2) Never done   PAP SMEAR-Modifier  08/11/2019   COVID-19 Vaccine (4 - 2023-24 season) 07/09/2022    Colorectal cancer screening: Type of screening: Colonoscopy. Completed 12/05/2017. Repeat every 5 years  Mammogram status: Completed 05/04/22. Repeat every year   Lung Cancer Screening: (Low Dose CT Chest recommended if Age 28-80years, 30 pack-year currently smoking OR have quit w/in 15years.) does not qualify.    Additional Screening:  Hepatitis C Screening: does qualify; Completed 10/06/2015  Vision Screening: Recommended annual ophthalmology exams for early detection of glaucoma and other disorders of the eye. Is the patient up to date with their annual eye exam?  Yes  Who is the provider or what is the name of the office in  which the patient attends annual eye exams? Dr HHerbert DeanerIf pt is not established with a provider, would they like to be referred to a provider to establish care? No .   Dental Screening: Recommended annual dental exams for proper oral hygiene  Community Resource Referral / Chronic Care Management: CRR required this visit?  No   CCM required this visit?  No      Plan:     I have personally reviewed and noted the following in the patient's chart:   Medical and social history Use of alcohol, tobacco or illicit drugs  Current medications and supplements including opioid prescriptions. Patient is not currently taking opioid prescriptions. Functional ability and status Nutritional status Physical activity Advanced directives List of other physicians Hospitalizations, surgeries, and ER visits in previous 12 months Vitals Screenings to include cognitive, depression, and falls Referrals and appointments  In addition, I have reviewed and discussed with patient certain preventive protocols, quality metrics, and best practice  recommendations. A written personalized care plan for preventive services as well as general preventive health recommendations were provided to patient.     Shelda Altes, CMA   11/18/2022   Nurse Notes:  Ms. Drewry , Thank you for taking time to come for your Medicare Wellness Visit. I appreciate your ongoing commitment to your health goals. Please review the following plan we discussed and let me know if I can assist you in the future.   These are the goals we discussed:  Goals      Patient Stated     Would like to move away from where she lives a little change of pace would be nice.  Trying to keep herself occupied        This is a list of the screening recommended for you and due dates:  Health Maintenance  Topic Date Due   DTaP/Tdap/Td vaccine (1 - Tdap) Never done   Zoster (Shingles) Vaccine (1 of 2) Never done   Pap Smear  08/11/2019   COVID-19  Vaccine (4 - 2023-24 season) 07/09/2022   Flu Shot  02/06/2023*   Colon Cancer Screening  12/05/2022   Medicare Annual Wellness Visit  11/19/2023   Mammogram  05/04/2024   Hepatitis C Screening: USPSTF Recommendation to screen - Ages 18-79 yo.  Completed   HIV Screening  Completed   HPV Vaccine  Aged Out  *Topic was postponed. The date shown is not the original due date.

## 2022-11-18 NOTE — Patient Instructions (Signed)
Deanna Floyd , Thank you for taking time to come for your Medicare Wellness Visit. I appreciate your ongoing commitment to your health goals. Please review the following plan we discussed and let me know if I can assist you in the future.   These are the goals we discussed:  Goals   None     This is a list of the screening recommended for you and due dates:  Health Maintenance  Topic Date Due   DTaP/Tdap/Td vaccine (1 - Tdap) Never done   Zoster (Shingles) Vaccine (1 of 2) Never done   Pap Smear  08/11/2019   COVID-19 Vaccine (4 - 2023-24 season) 07/09/2022   Medicare Annual Wellness Visit  08/06/2022   Flu Shot  02/06/2023*   Colon Cancer Screening  12/05/2022   Mammogram  05/04/2024   Hepatitis C Screening: USPSTF Recommendation to screen - Ages 18-79 yo.  Completed   HIV Screening  Completed   HPV Vaccine  Aged Out  *Topic was postponed. The date shown is not the original due date.    Preventive Care 40-64 Years, Female Preventive care refers to lifestyle choices and visits with your health care provider that can promote health and wellness. What does preventive care include? A yearly physical exam. This is also called an annual well check. Dental exams once or twice a year. Routine eye exams. Ask your health care provider how often you should have your eyes checked. Personal lifestyle choices, including: Daily care of your teeth and gums. Regular physical activity. Eating a healthy diet. Avoiding tobacco and drug use. Limiting alcohol use. Practicing safe sex. Taking low-dose aspirin daily starting at age 73. Taking vitamin and mineral supplements as recommended by your health care provider. What happens during an annual well check? The services and screenings done by your health care provider during your annual well check will depend on your age, overall health, lifestyle risk factors, and family history of disease. Counseling  Your health care provider may ask you  questions about your: Alcohol use. Tobacco use. Drug use. Emotional well-being. Home and relationship well-being. Sexual activity. Eating habits. Work and work Statistician. Method of birth control. Menstrual cycle. Pregnancy history. Screening  You may have the following tests or measurements: Height, weight, and BMI. Blood pressure. Lipid and cholesterol levels. These may be checked every 5 years, or more frequently if you are over 14 years old. Skin check. Lung cancer screening. You may have this screening every year starting at age 109 if you have a 30-pack-year history of smoking and currently smoke or have quit within the past 15 years. Fecal occult blood test (FOBT) of the stool. You may have this test every year starting at age 14. Flexible sigmoidoscopy or colonoscopy. You may have a sigmoidoscopy every 5 years or a colonoscopy every 10 years starting at age 48. Hepatitis C blood test. Hepatitis B blood test. Sexually transmitted disease (STD) testing. Diabetes screening. This is done by checking your blood sugar (glucose) after you have not eaten for a while (fasting). You may have this done every 1-3 years. Mammogram. This may be done every 1-2 years. Talk to your health care provider about when you should start having regular mammograms. This may depend on whether you have a family history of breast cancer. BRCA-related cancer screening. This may be done if you have a family history of breast, ovarian, tubal, or peritoneal cancers. Pelvic exam and Pap test. This may be done every 3 years starting at age 62.  Starting at age 62, this may be done every 5 years if you have a Pap test in combination with an HPV test. Bone density scan. This is done to screen for osteoporosis. You may have this scan if you are at high risk for osteoporosis. Discuss your test results, treatment options, and if necessary, the need for more tests with your health care provider. Vaccines  Your health  care provider may recommend certain vaccines, such as: Influenza vaccine. This is recommended every year. Tetanus, diphtheria, and acellular pertussis (Tdap, Td) vaccine. You may need a Td booster every 10 years. Zoster vaccine. You may need this after age 59. Pneumococcal 13-valent conjugate (PCV13) vaccine. You may need this if you have certain conditions and were not previously vaccinated. Pneumococcal polysaccharide (PPSV23) vaccine. You may need one or two doses if you smoke cigarettes or if you have certain conditions. Talk to your health care provider about which screenings and vaccines you need and how often you need them. This information is not intended to replace advice given to you by your health care provider. Make sure you discuss any questions you have with your health care provider. Document Released: 11/21/2015 Document Revised: 07/14/2016 Document Reviewed: 08/26/2015 Elsevier Interactive Patient Education  2017 [employer] Prevention in the Home Falls can cause injuries. They can happen to people of all ages. There are many things you can do to make your home safe and to help prevent falls. What can I do on the outside of my home? Regularly fix the edges of walkways and driveways and fix any cracks. Remove anything that might make you trip as you walk through a door, such as a raised step or threshold. Trim any bushes or trees on the path to your home. Use bright outdoor lighting. Clear any walking paths of anything that might make someone trip, such as rocks or tools. Regularly check to see if handrails are loose or broken. Make sure that both sides of any steps have handrails. Any raised decks and porches should have guardrails on the edges. Have any leaves, snow, or ice cleared regularly. Use sand or salt on walking paths during winter. Clean up any spills in your garage right away. This includes oil or grease spills. What can I do in the bathroom? Use  night lights. Install grab bars by the toilet and in the tub and shower. Do not use towel bars as grab bars. Use non-skid mats or decals in the tub or shower. If you need to sit down in the shower, use a plastic, non-slip stool. Keep the floor dry. Clean up any water that spills on the floor as soon as it happens. Remove soap buildup in the tub or shower regularly. Attach bath mats securely with double-sided non-slip rug tape. Do not have throw rugs and other things on the floor that can make you trip. What can I do in the bedroom? Use night lights. Make sure that you have a light by your bed that is easy to reach. Do not use any sheets or blankets that are too big for your bed. They should not hang down onto the floor. Have a firm chair that has side arms. You can use this for support while you get dressed. Do not have throw rugs and other things on the floor that can make you trip. What can I do in the kitchen? Clean up any spills right away. Avoid walking on wet floors. Keep items that you use  a lot in easy-to-reach places. If you need to reach something above you, use a strong step stool that has a grab bar. Keep electrical cords out of the way. Do not use floor polish or wax that makes floors slippery. If you must use wax, use non-skid floor wax. Do not have throw rugs and other things on the floor that can make you trip. What can I do with my stairs? Do not leave any items on the stairs. Make sure that there are handrails on both sides of the stairs and use them. Fix handrails that are broken or loose. Make sure that handrails are as long as the stairways. Check any carpeting to make sure that it is firmly attached to the stairs. Fix any carpet that is loose or worn. Avoid having throw rugs at the top or bottom of the stairs. If you do have throw rugs, attach them to the floor with carpet tape. Make sure that you have a light switch at the top of the stairs and the bottom of the  stairs. If you do not have them, ask someone to add them for you. What else can I do to help prevent falls? Wear shoes that: Do not have high heels. Have rubber bottoms. Are comfortable and fit you well. Are closed at the toe. Do not wear sandals. If you use a stepladder: Make sure that it is fully opened. Do not climb a closed stepladder. Make sure that both sides of the stepladder are locked into place. Ask someone to hold it for you, if possible. Clearly mark and make sure that you can see: Any grab bars or handrails. First and last steps. Where the edge of each step is. Use tools that help you move around (mobility aids) if they are needed. These include: Canes. Walkers. Scooters. Crutches. Turn on the lights when you go into a dark area. Replace any light bulbs as soon as they burn out. Set up your furniture so you have a clear path. Avoid moving your furniture around. If any of your floors are uneven, fix them. If there are any pets around you, be aware of where they are. Review your medicines with your doctor. Some medicines can make you feel dizzy. This can increase your chance of falling. Ask your doctor what other things that you can do to help prevent falls. This information is not intended to replace advice given to you by your health care provider. Make sure you discuss any questions you have with your health care provider. Document Released: 08/21/2009 Document Revised: 04/01/2016 Document Reviewed: 11/29/2014 Elsevier Interactive Patient Education  2017 [employer].

## 2022-11-22 NOTE — Therapy (Signed)
OUTPATIENT PHYSICAL THERAPY TREATMENT NOTE   Patient Name: Deanna Floyd MRN: [id number] DOB:Aug 21, 1961, 62 y.o., female Today's Date: 11/23/2022  PCP: Ellery Plunk  REFERRING PROVIDER: Ellery Plunk   END OF SESSION:   PT End of Session - 11/23/22 1044     Visit Number 8    Number of Visits 17    Date for PT Re-Evaluation 11/29/22    Authorization Type [employer] Medicare    PT Start Time 0388    PT Stop Time 8280    PT Time Calculation (min) 39 min    Activity Tolerance Patient tolerated treatment well    Behavior During Therapy WFL for tasks assessed/performed                   Past Medical History:  Diagnosis Date   GERD (gastroesophageal reflux disease)    Hypertension    Stroke ([place]) 2007   Past Surgical History:  Procedure Laterality Date   FOOT SURGERY  2015   left side had 2 surgeries in 2015   [place]  2010   Patient Active Problem List   Diagnosis Date Noted   Axillary lump, left 09/17/2022   Solitary pulmonary nodule 06/17/2022   Generalized abdominal pain 11/12/2021   Loss of weight 11/12/2021   Right wrist pain 03/27/2020   Neuropathy 08/21/2019   Chronic foot pain, left 08/21/2019   Seasonal allergies 08/21/2019   Right upper quadrant pain 05/08/2019   Gastroesophageal reflux disease with esophagitis 05/08/2019   Hot flashes due to menopause 05/08/2019   Carpal tunnel syndrome on both sides 03/49/1791   Metabolic syndrome 50/56/9794   Essential hypertension 11/19/2016   Allergic dermatitis 11/19/2016   Allergy status to unspecified drugs, medicaments and biological substances status 11/19/2016   BP check 09/22/2016   Post-operative state 06/02/2016   Obesity 01/08/2016   Low back pain 10/06/2015   Pain in joint, ankle and foot 10/06/2015    REFERRING DIAG: M25.552 (ICD-10-CM) - Left hip pain    THERAPY DIAG:  Pain in left hip  Muscle weakness (generalized)  Other abnormalities of gait and  mobility  Rationale for Evaluation and Treatment Rehabilitation  PERTINENT HISTORY: CVA, HTN   PRECAUTIONS: None   SUBJECTIVE:                                                                                                                                                                                      SUBJECTIVE STATEMENT: Pt presents to PT with reports of continued L hip pain. Has been compliant with HEP with no adverse effect. Pt ready to begin PT at this time.  PAIN:  Are you having pain?  Yes: NPRS scale: 6/10 Best: 6/10 Worst: 10/10 Pain location: left hip Pain description: sharp Aggravating factors: mornings, prolonged standing, walking Relieving factors: heat, medication   OBJECTIVE: (objective measures completed at initial evaluation unless otherwise dated)  DIAGNOSTIC FINDINGS:             See imaging    PATIENT SURVEYS:  FOTO: 72% function; 66% predicted - 11/10/2022   COGNITION: Overall cognitive status: Within functional limits for tasks assessed                         SENSATION: WFL   POSTURE: rounded shoulders and forward head   PALPATION: TTP to L glute medius, L piriformis, L trochanteric bursa   VITALS: BP: 134/84 - L arm in sitting   LOWER EXTREMITY MMT:   MMT Right eval Left eval Right 11/10/2021 Left 11/10/2021  Hip flexion 4/5 3/5 p! 4/5 4/5  Hip extension        Hip abduction 4/5 3/5 p! 4/5 3+/5  Hip adduction 4/5 3/5 p! 4/5 4/5  Hip internal rotation        Hip external rotation        Knee flexion        Knee extension        Ankle dorsiflexion        Ankle plantarflexion        Ankle inversion        Ankle eversion         (Blank rows = not tested)   LOWER EXTREMITY SPECIAL TESTS:  Hip special tests: Saralyn Pilar (FABER) test: positive  and Hip scouring test: positive    FUNCTIONAL TESTS:  30 Second Sit to Stand: 10 reps - 11/10/2022   GAIT: Distance walked: 62f Assistive device utilized: None Level of assistance: Complete  [place] Comments: antalgic gait L     TREATMENT: [facility] Adult PT Treatment:                                                DATE: 11/23/2022 Therapeutic Exercise: NuStep lvl 5 UE/LE x 3 min while taking subjective  STS 2x10 Lateral walk at counter x 3 laps RTB  Standing hip abd/ext 2x10 RTB Bridge 3x10 Supine SLR x 15 each Supine pilates ring squeeze 2x10 - 3" hold S/L clamshell 2x15 GTB Leg press 3x10 45#  [facility] Adult PT Treatment:                                                DATE: 11/10/2022 Therapeutic Exercise: NuStep lvl 5 UE/LE x 3 min while taking subjective  STS x 10 Bridge 2x10 Supine SLR x 15 each Seated pilates ring squeeze 2x10 - 3" hold Lateral walk at counter x 3 laps RTB  Standing hip abd/ext 2x10 RTB Leg press 2x10 35# S/L clamshell 2x15 GTB  [facility] Adult PT Treatment:                                                DATE: 11/02/2022 Therapeutic  Exercise: NuStep lvl 5 UE/LE x 4 min while taking subjective  Lateral walk at counter x 3 laps RTB  Standing hip abd/ext 2x10 RTB LTR x 10 each  Bridge 3x10  Seated fig 4 stretch 2x30" Supine SLR x 10 each S/L clamshell 2x15 GTB Supine pilates ring squeeze 2x10 - 3" hold STS 2x10 - no UE  [facility] Adult PT Treatment:                                                DATE: 10/27/2022 Therapeutic Exercise: NuStep lvl 5 UE/LE x 3 min while taking subjective  LTR x 10 each  Bridge 2x10  Supine SLR x 10 each Supine clamshell 3x15 BTB Supine marching BTB 2x10 BIL Supine ball squeeze 2x10 - 5" hold STS 2x10 - no UE Lateral walk at counter x 3 laps RTB   PATIENT EDUCATION:  Education details: HEP update Person educated: Patient Education method: Explanation, Demonstration, and Handouts Education comprehension: verbalized understanding and returned demonstration   HOME EXERCISE PROGRAM: Access Code: [email or web address] URL: [email or web address] Date: 10/27/2022 Prepared by: Octavio Manns  Exercises - Supine  Lower Trunk Rotation  - 1 x daily - 7 x weekly - 2 sets - 10 reps - 5 sec hold - Supine Figure 4 Piriformis Stretch  - 1 x daily - 7 x weekly - 2 reps - 30 sec hold - Hooklying Clamshell with Resistance  - 1 x daily - 7 x weekly - 3 sets - 10 reps - green theraband hold - Active Straight Leg Raise with Quad Set  - 1 x daily - 7 x weekly - 2 sets - 10 reps - Supine Bridge  - 1 x daily - 7 x weekly - 2 sets - 10 reps - Seated Hip Adduction Isometrics with Ball  - 1 x daily - 7 x weekly - 2 sets - 10 reps - 5 sec hold - Standing Hip Abduction with Resistance at Ankles and Counter Support  - 1 x daily - 7 x weekly - 2 sets - 10 reps - red tband hold - Standing Hip Extension with Resistance at Ankles and Counter Support  - 1 x daily - 7 x weekly - 2 sets - 10 reps - red tband hold   ASSESSMENT:   CLINICAL IMPRESSION: Pt was able to complete all prescribed exercises with no adverse effect or increase in pain. Therapy today focused on improving proximal hip strength and functional mobility. She continues to benefit from skilled PT services and will continue to be seen and progressed as able per POC.    OBJECTIVE IMPAIRMENTS: decreased activity tolerance, decreased balance, decreased mobility, difficulty walking, decreased ROM, decreased strength, and pain.    ACTIVITY LIMITATIONS: carrying, lifting, standing, squatting, stairs, transfers, and locomotion level   PARTICIPATION LIMITATIONS: meal prep, cleaning, driving, shopping, community activity, and yard work   PERSONAL FACTORS: Fitness, Time since onset of injury/illness/exacerbation, and 1-2 comorbidities: CVA, HTN  are also affecting patient's functional outcome.      GOALS: Goals reviewed with patient? No   SHORT TERM GOALS: Target date: 10/25/2022   Pt will be compliant and knowledgeable with initial HEP for improved comfort and carryover Baseline: initial HEP given  Goal status: MET   2.  Pt will self report left hip pain no greater  than 7/10 for improved comfort and functional ability  Baseline: 10/10 at worst Goal status: MET   LONG TERM GOALS: Target date: 11/29/2022   Pt will improve FOTO function score to no less than 66% as proxy for functional improvement Baseline: 56% function 11/10/2022: 72% function Goal status: INITIAL    2.  Pt will self report left hip pain no greater than 3/10 for improved comfort and functional ability Baseline: 10/10 at worst 11/10/2022: 6/10 at wrost Goal status: ONGOING   3.  Pt will increase 30 Second Sit to Stand rep count to no less than 9 reps for improved balance, strength, and functional mobility Baseline: 6 reps  11/10/2021: 10 reps Goal status: MET   4.  Pt will be able to stand at least 30 minutes without increase in left hip pain for improved comfort and functional ability when performing home ADLs Baseline: 15 minutes 11/10/2022: 15 minutes Goal status: ONGOING    5.  Pt will increase left hip flex/abd to no less than 4/5 for improved functional mobility Baseline: see chart Goal status: ONGOING    PLAN:   PT FREQUENCY: 2x/week   PT DURATION: 4 weeks   PLANNED INTERVENTIONS: Therapeutic exercises, Therapeutic activity, Neuromuscular re-education, Balance training, Gait training, Patient/Family education, Self Care, Joint mobilization, Aquatic Therapy, Dry Needling, Electrical stimulation, Cryotherapy, Moist heat, Manual therapy, and Re-evaluation   PLAN FOR NEXT SESSION: assess HEP response, proximal hip strenghtening   Ward Chatters, PT 11/23/2022, 11:24 AM

## 2022-11-23 ENCOUNTER — Other Ambulatory Visit: Payer: Self-pay

## 2022-11-23 ENCOUNTER — Ambulatory Visit: Payer: 59

## 2022-11-23 DIAGNOSIS — R2689 Other abnormalities of gait and mobility: Secondary | ICD-10-CM

## 2022-11-23 DIAGNOSIS — M25552 Pain in left hip: Secondary | ICD-10-CM

## 2022-11-23 DIAGNOSIS — M6281 Muscle weakness (generalized): Secondary | ICD-10-CM

## 2022-11-24 ENCOUNTER — Encounter: Payer: Self-pay | Admitting: Gastroenterology

## 2022-11-25 ENCOUNTER — Ambulatory Visit: Payer: 59

## 2022-11-25 DIAGNOSIS — M25552 Pain in left hip: Secondary | ICD-10-CM

## 2022-11-25 DIAGNOSIS — M6281 Muscle weakness (generalized): Secondary | ICD-10-CM

## 2022-11-25 NOTE — Therapy (Signed)
OUTPATIENT PHYSICAL THERAPY TREATMENT NOTE   Patient Name: Deanna Floyd MRN: [id number] DOB:[birth year], 62 y.o., female Today's Date: 11/25/2022  PCP: Ellery Plunk  REFERRING PROVIDER: Ellery Plunk   END OF SESSION:   PT End of Session - 11/25/22 1100     Visit Number 9    Number of Visits 17    Date for PT Re-Evaluation 11/29/22    Authorization Type [employer] Medicare    PT Start Time 3220    PT Stop Time 1128    PT Time Calculation (min) 30 min    Activity Tolerance Patient tolerated treatment well    Behavior During Therapy WFL for tasks assessed/performed                    Past Medical History:  Diagnosis Date   GERD (gastroesophageal reflux disease)    Hypertension    Stroke ([place]) 2007   Past Surgical History:  Procedure Laterality Date   FOOT SURGERY  2015   left side had 2 surgeries in 2015   [place]  2010   Patient Active Problem List   Diagnosis Date Noted   Axillary lump, left 09/17/2022   Solitary pulmonary nodule 06/17/2022   Generalized abdominal pain 11/12/2021   Loss of weight 11/12/2021   Right wrist pain 03/27/2020   Neuropathy 08/21/2019   Chronic foot pain, left 08/21/2019   Seasonal allergies 08/21/2019   Right upper quadrant pain 05/08/2019   Gastroesophageal reflux disease with esophagitis 05/08/2019   Hot flashes due to menopause 05/08/2019   Carpal tunnel syndrome on both sides 25/42/7062   Metabolic syndrome 37/62/8315   Essential hypertension 11/19/2016   Allergic dermatitis 11/19/2016   Allergy status to unspecified drugs, medicaments and biological substances status 11/19/2016   BP check 09/22/2016   Post-operative state 06/02/2016   Obesity 01/08/2016   Low back pain 10/06/2015   Pain in joint, ankle and foot 10/06/2015    REFERRING DIAG: M25.552 (ICD-10-CM) - Left hip pain    THERAPY DIAG:  Pain in left hip  Muscle weakness (generalized)  Rationale for Evaluation and Treatment  Rehabilitation  PERTINENT HISTORY: CVA, HTN   PRECAUTIONS: None   SUBJECTIVE:                                                                                                                                                                                      SUBJECTIVE STATEMENT: Pt presents to PT with reports of continued L hip pain. Has been compliant with HEP. Ready to begin session at this time.   PAIN:  Are you having pain?  Yes: NPRS scale: 6/10  Best: 6/10 Worst: 10/10 Pain location: left hip Pain description: sharp Aggravating factors: mornings, prolonged standing, walking Relieving factors: heat, medication   OBJECTIVE: (objective measures completed at initial evaluation unless otherwise dated)  DIAGNOSTIC FINDINGS:             See imaging    PATIENT SURVEYS:  FOTO: 72% function; 66% predicted - 11/10/2022   COGNITION: Overall cognitive status: Within functional limits for tasks assessed                         SENSATION: WFL   POSTURE: rounded shoulders and forward head   PALPATION: TTP to L glute medius, L piriformis, L trochanteric bursa   VITALS: BP: 134/84 - L arm in sitting   LOWER EXTREMITY MMT:   MMT Right eval Left eval Right 11/10/2021 Left 11/10/2021  Hip flexion 4/5 3/5 p! 4/5 4/5  Hip extension        Hip abduction 4/5 3/5 p! 4/5 3+/5  Hip adduction 4/5 3/5 p! 4/5 4/5  Hip internal rotation        Hip external rotation        Knee flexion        Knee extension        Ankle dorsiflexion        Ankle plantarflexion        Ankle inversion        Ankle eversion         (Blank rows = not tested)   LOWER EXTREMITY SPECIAL TESTS:  Hip special tests: Saralyn Pilar (FABER) test: positive  and Hip scouring test: positive    FUNCTIONAL TESTS:  30 Second Sit to Stand: 10 reps - 11/10/2022   GAIT: Distance walked: 80f Assistive device utilized: None Level of assistance: Complete Independence Comments: antalgic gait L     TREATMENT: [facility] Adult  PT Treatment:                                                DATE: 11/25/2022 Therapeutic Exercise: NuStep lvl 5 UE/LE x 3 min while taking subjective  STS 2x10 - 10# Lateral walk at counter x 3 laps RTB  Standing hip abd/ext 2x10 RTB Bridge 3x10 with ball squeeze  Supine SLR x 15 each S/L clamshell 2x15 GTB Leg press 3x10 45#  [facility] Adult PT Treatment:                                                DATE: 11/23/2022 Therapeutic Exercise: NuStep lvl 5 UE/LE x 3 min while taking subjective  STS 2x10 Lateral walk at counter x 3 laps RTB  Standing hip abd/ext 2x10 RTB Bridge 3x10 Supine SLR x 15 each Supine pilates ring squeeze 2x10 - 3" hold S/L clamshell 2x15 GTB Leg press 3x10 45#  [facility] Adult PT Treatment:                                                DATE: 11/10/2022 Therapeutic Exercise: NuStep lvl 5 UE/LE x 3 min while taking subjective  STS x  10 Bridge 2x10 Supine SLR x 15 each Seated pilates ring squeeze 2x10 - 3" hold Lateral walk at counter x 3 laps RTB  Standing hip abd/ext 2x10 RTB Leg press 2x10 35# S/L clamshell 2x15 GTB  [facility] Adult PT Treatment:                                                DATE: 11/02/2022 Therapeutic Exercise: NuStep lvl 5 UE/LE x 4 min while taking subjective  Lateral walk at counter x 3 laps RTB  Standing hip abd/ext 2x10 RTB LTR x 10 each  Bridge 3x10  Seated fig 4 stretch 2x30" Supine SLR x 10 each S/L clamshell 2x15 GTB Supine pilates ring squeeze 2x10 - 3" hold STS 2x10 - no UE  [facility] Adult PT Treatment:                                                DATE: 10/27/2022 Therapeutic Exercise: NuStep lvl 5 UE/LE x 3 min while taking subjective  LTR x 10 each  Bridge 2x10  Supine SLR x 10 each Supine clamshell 3x15 BTB Supine marching BTB 2x10 BIL Supine ball squeeze 2x10 - 5" hold STS 2x10 - no UE Lateral walk at counter x 3 laps RTB   PATIENT EDUCATION:  Education details: HEP update Person educated: Patient Education method:  Explanation, Demonstration, and Handouts Education comprehension: verbalized understanding and returned demonstration   HOME EXERCISE PROGRAM: Access Code: [email or web address] URL: [email or web address] Date: 10/27/2022 Prepared by: Octavio Manns  Exercises - Supine Lower Trunk Rotation  - 1 x daily - 7 x weekly - 2 sets - 10 reps - 5 sec hold - Supine Figure 4 Piriformis Stretch  - 1 x daily - 7 x weekly - 2 reps - 30 sec hold - Hooklying Clamshell with Resistance  - 1 x daily - 7 x weekly - 3 sets - 10 reps - green theraband hold - Active Straight Leg Raise with Quad Set  - 1 x daily - 7 x weekly - 2 sets - 10 reps - Supine Bridge  - 1 x daily - 7 x weekly - 2 sets - 10 reps - Seated Hip Adduction Isometrics with Ball  - 1 x daily - 7 x weekly - 2 sets - 10 reps - 5 sec hold - Standing Hip Abduction with Resistance at Ankles and Counter Support  - 1 x daily - 7 x weekly - 2 sets - 10 reps - red tband hold - Standing Hip Extension with Resistance at Ankles and Counter Support  - 1 x daily - 7 x weekly - 2 sets - 10 reps - red tband hold   ASSESSMENT:   CLINICAL IMPRESSION: Pt was able to complete all prescribed exercises with no adverse effect or increase in pain. Therapy today focused on improving proximal hip strength and functional mobility. She continues to benefit from skilled PT services and will continue to be seen and progressed as able per POC.     OBJECTIVE IMPAIRMENTS: decreased activity tolerance, decreased balance, decreased mobility, difficulty walking, decreased ROM, decreased strength, and pain.    ACTIVITY LIMITATIONS: carrying, lifting, standing, squatting, stairs, transfers, and locomotion level   PARTICIPATION LIMITATIONS: meal prep, cleaning,  driving, shopping, community activity, and yard work   PERSONAL FACTORS: Fitness, Time since onset of injury/illness/exacerbation, and 1-2 comorbidities: CVA, HTN  are also affecting patient's functional outcome.       GOALS: Goals reviewed with patient? No   SHORT TERM GOALS: Target date: 10/25/2022   Pt will be compliant and knowledgeable with initial HEP for improved comfort and carryover Baseline: initial HEP given  Goal status: MET   2.  Pt will self report left hip pain no greater than 7/10 for improved comfort and functional ability Baseline: 10/10 at worst Goal status: MET   LONG TERM GOALS: Target date: 11/29/2022   Pt will improve FOTO function score to no less than 66% as proxy for functional improvement Baseline: 56% function 11/10/2022: 72% function Goal status: INITIAL    2.  Pt will self report left hip pain no greater than 3/10 for improved comfort and functional ability Baseline: 10/10 at worst 11/10/2022: 6/10 at wrost Goal status: ONGOING   3.  Pt will increase 30 Second Sit to Stand rep count to no less than 9 reps for improved balance, strength, and functional mobility Baseline: 6 reps  11/10/2021: 10 reps Goal status: MET   4.  Pt will be able to stand at least 30 minutes without increase in left hip pain for improved comfort and functional ability when performing home ADLs Baseline: 15 minutes 11/10/2022: 15 minutes Goal status: ONGOING    5.  Pt will increase left hip flex/abd to no less than 4/5 for improved functional mobility Baseline: see chart Goal status: ONGOING    PLAN:   PT FREQUENCY: 2x/week   PT DURATION: 4 weeks   PLANNED INTERVENTIONS: Therapeutic exercises, Therapeutic activity, Neuromuscular re-education, Balance training, Gait training, Patient/Family education, Self Care, Joint mobilization, Aquatic Therapy, Dry Needling, Electrical stimulation, Cryotherapy, Moist heat, Manual therapy, and Re-evaluation   PLAN FOR NEXT SESSION: assess HEP response, proximal hip strenghtening   Ward Chatters, PT 11/25/2022, 11:28 AM

## 2022-11-29 ENCOUNTER — Inpatient Hospital Stay: Admission: RE | Admit: 2022-11-29 | Payer: Medicaid Other | Source: Ambulatory Visit

## 2022-11-29 ENCOUNTER — Ambulatory Visit: Payer: 59

## 2022-11-30 NOTE — Telephone Encounter (Signed)
This is the pt that needs the Mammogram referral

## 2022-11-30 NOTE — Telephone Encounter (Signed)
Mammo order placed.

## 2022-12-02 ENCOUNTER — Ambulatory Visit: Payer: 59

## 2022-12-02 DIAGNOSIS — R2689 Other abnormalities of gait and mobility: Secondary | ICD-10-CM

## 2022-12-02 DIAGNOSIS — M6281 Muscle weakness (generalized): Secondary | ICD-10-CM

## 2022-12-02 DIAGNOSIS — M25552 Pain in left hip: Secondary | ICD-10-CM

## 2022-12-02 NOTE — Therapy (Signed)
OUTPATIENT PHYSICAL THERAPY TREATMENT NOTE/DISCHARGE  PHYSICAL THERAPY DISCHARGE SUMMARY  Visits from Start of Care: 10  Current functional level related to goals / functional outcomes: See goals and objective   Remaining deficits: See goals and objective   Education / Equipment: HEP   Patient agrees to discharge. Patient goals were met. Patient is being discharged due to meeting the stated rehab goals.   Patient Name: Deanna Floyd MRN: [id number] DOB:Jan 07, 1961, 62 y.o., female Today's Date: 12/02/2022  PCP: Ellery Plunk  REFERRING PROVIDER: Ellery Plunk   END OF SESSION:   PT End of Session - 12/02/22 1000     Visit Number 10    Number of Visits 17    Date for PT Re-Evaluation 11/29/22    Authorization Type [employer] Medicare    PT Start Time 1058   arrived late   PT Stop Time 1124    PT Time Calculation (min) 26 min    Activity Tolerance Patient tolerated treatment well    Behavior During Therapy WFL for tasks assessed/performed                     Past Medical History:  Diagnosis Date   GERD (gastroesophageal reflux disease)    Hypertension    Stroke ([place]) 2007   Past Surgical History:  Procedure Laterality Date   FOOT SURGERY  2015   left side had 2 surgeries in 2015   [place]  2010   Patient Active Problem List   Diagnosis Date Noted   Axillary lump, left 09/17/2022   Solitary pulmonary nodule 06/17/2022   Generalized abdominal pain 11/12/2021   Loss of weight 11/12/2021   Right wrist pain 03/27/2020   Neuropathy 08/21/2019   Chronic foot pain, left 08/21/2019   Seasonal allergies 08/21/2019   Right upper quadrant pain 05/08/2019   Gastroesophageal reflux disease with esophagitis 05/08/2019   Hot flashes due to menopause 05/08/2019   Carpal tunnel syndrome on both sides 35/32/9924   Metabolic syndrome 26/83/4196   Essential hypertension 11/19/2016   Allergic dermatitis 11/19/2016   Allergy status to  unspecified drugs, medicaments and biological substances status 11/19/2016   BP check 09/22/2016   Post-operative state 06/02/2016   Obesity 01/08/2016   Low back pain 10/06/2015   Pain in joint, ankle and foot 10/06/2015    REFERRING DIAG: M25.552 (ICD-10-CM) - Left hip pain    THERAPY DIAG:  Pain in left hip  Muscle weakness (generalized)  Other abnormalities of gait and mobility  Rationale for Evaluation and Treatment Rehabilitation  PERTINENT HISTORY: CVA, HTN   PRECAUTIONS: None   SUBJECTIVE:  SUBJECTIVE STATEMENT: Pt presents to PT with continued pain in L hip. Has been compliant with HEP. Ready to begin PT at this time.  PAIN:  Are you having pain?  Yes: NPRS scale: 6/10 Best: 6/10 Worst: 10/10 Pain location: left hip Pain description: sharp Aggravating factors: mornings, prolonged standing, walking Relieving factors: heat, medication   OBJECTIVE: (objective measures completed at initial evaluation unless otherwise dated)  DIAGNOSTIC FINDINGS:             See imaging    PATIENT SURVEYS:  FOTO: 72% function; 66% predicted - 11/10/2022   COGNITION: Overall cognitive status: Within functional limits for tasks assessed                         SENSATION: WFL   POSTURE: rounded shoulders and forward head   PALPATION: TTP to L glute medius, L piriformis, L trochanteric bursa   VITALS: BP: 134/84 - L arm in sitting   LOWER EXTREMITY MMT:   MMT Right eval Left eval Right 11/10/2021 Left 11/10/2021 Left 12/02/22  Hip flexion 4/5 3/5 p! 4/5 4/5   Hip extension         Hip abduction 4/5 3/5 p! 4/5 3+/5 4/5  Hip adduction 4/5 3/5 p! 4/5 4/5   Hip internal rotation         Hip external rotation         Knee flexion         Knee extension         Ankle dorsiflexion         Ankle  plantarflexion         Ankle inversion         Ankle eversion          (Blank rows = not tested)   LOWER EXTREMITY SPECIAL TESTS:  Hip special tests: Saralyn Pilar (FABER) test: positive  and Hip scouring test: positive    FUNCTIONAL TESTS:  30 Second Sit to Stand: 10 reps - 11/10/2022   GAIT: Distance walked: 55f Assistive device utilized: None Level of assistance: Complete Independence Comments: antalgic gait L     TREATMENT: [facility] Adult PT Treatment:                                                DATE: 12/02/2022 Therapeutic Exercise: LTR x 10 Supine clamshell 2x15 BTB Bridge 2x10  Supine SLR x 15 each STS 2x10 - 10# Lateral walk at counter x 3 laps RTB  Standing hip abd/ext 2x10 RTB Bridge 3x10 with ball squeeze  Supine SLR x 15 each S/L clamshell 2x15 GTB Leg press 3x10 45#  [facility] Adult PT Treatment:                                                DATE: 11/25/2022 Therapeutic Exercise: NuStep lvl 5 UE/LE x 3 min while taking subjective  STS 2x10 - 10# Lateral walk at counter x 3 laps RTB  Standing hip abd/ext 2x10 RTB Bridge 3x10 with ball squeeze  Supine SLR x 15 each S/L clamshell 2x15 GTB Leg press 3x10 45#  [facility] Adult PT Treatment:  DATE: 11/23/2022 Therapeutic Exercise: NuStep lvl 5 UE/LE x 3 min while taking subjective  STS 2x10 Lateral walk at counter x 3 laps RTB  Standing hip abd/ext 2x10 RTB Bridge 3x10 Supine SLR x 15 each Supine pilates ring squeeze 2x10 - 3" hold S/L clamshell 2x15 GTB Leg press 3x10 45#  [facility] Adult PT Treatment:                                                DATE: 11/10/2022 Therapeutic Exercise: NuStep lvl 5 UE/LE x 3 min while taking subjective  STS x 10 Bridge 2x10 Supine SLR x 15 each Seated pilates ring squeeze 2x10 - 3" hold Lateral walk at counter x 3 laps RTB  Standing hip abd/ext 2x10 RTB Leg press 2x10 35# S/L clamshell 2x15 GTB  [facility] Adult PT Treatment:                                                 DATE: 11/02/2022 Therapeutic Exercise: NuStep lvl 5 UE/LE x 4 min while taking subjective  Lateral walk at counter x 3 laps RTB  Standing hip abd/ext 2x10 RTB LTR x 10 each  Bridge 3x10  Seated fig 4 stretch 2x30" Supine SLR x 10 each S/L clamshell 2x15 GTB Supine pilates ring squeeze 2x10 - 3" hold STS 2x10 - no UE  [facility] Adult PT Treatment:                                                DATE: 10/27/2022 Therapeutic Exercise: NuStep lvl 5 UE/LE x 3 min while taking subjective  LTR x 10 each  Bridge 2x10  Supine SLR x 10 each Supine clamshell 3x15 BTB Supine marching BTB 2x10 BIL Supine ball squeeze 2x10 - 5" hold STS 2x10 - no UE Lateral walk at counter x 3 laps RTB   PATIENT EDUCATION:  Education details: HEP update Person educated: Patient Education method: Explanation, Demonstration, and Handouts Education comprehension: verbalized understanding and returned demonstration   HOME EXERCISE PROGRAM: Access Code: [email or web address] URL: [email or web address] Date: 12/02/2022 Prepared by: Octavio Manns  Exercises - Supine Lower Trunk Rotation  - 3 x weekly - 2 sets - 10 reps - 5 sec hold - Hooklying Clamshell with Resistance  - 3 x weekly - 3 sets - 15 reps - blue theraband hold - Supine Bridge  - 3 x weekly - 2 sets - 10 reps - Active Straight Leg Raise with Quad Set  - 3 x weekly - 2 sets - 15 reps - Supine Figure 4 Piriformis Stretch  - 3 x weekly - 2 reps - 30 sec hold - Sit to Stand Without Arm Support  - 3 x weekly - 3 sets - 10 reps - Standing Hip Abduction with Resistance at Ankles and Counter Support  - 3 x weekly - 2 sets - 10 reps - red tband hold - Standing Hip Extension with Resistance at Ankles and Counter Support  - 3 x weekly - 2 sets - 10 reps - red tband hold   ASSESSMENT:   CLINICAL IMPRESSION: Pt was  able to complete prescribed exercises and demonstrated knowledge og HEP with no adverse effect. Over the course of PT  treatment she has progressed very well, showing improved LE strength and functional mobility. She has met all LTGs with exception of pain and should continue to improve with HEP compliance. Pt in agreement with current plan and ready to discharge at this time.      OBJECTIVE IMPAIRMENTS: decreased activity tolerance, decreased balance, decreased mobility, difficulty walking, decreased ROM, decreased strength, and pain.    ACTIVITY LIMITATIONS: carrying, lifting, standing, squatting, stairs, transfers, and locomotion level   PARTICIPATION LIMITATIONS: meal prep, cleaning, driving, shopping, community activity, and yard work   PERSONAL FACTORS: Fitness, Time since onset of injury/illness/exacerbation, and 1-2 comorbidities: CVA, HTN  are also affecting patient's functional outcome.      GOALS: Goals reviewed with patient? No   SHORT TERM GOALS: Target date: 10/25/2022   Pt will be compliant and knowledgeable with initial HEP for improved comfort and carryover Baseline: initial HEP given  Goal status: MET   2.  Pt will self report left hip pain no greater than 7/10 for improved comfort and functional ability Baseline: 10/10 at worst Goal status: MET   LONG TERM GOALS: Target date: 11/29/2022   Pt will improve FOTO function score to no less than 66% as proxy for functional improvement Baseline: 56% function 11/10/2022: 72% function Goal status:MET   2.  Pt will self report left hip pain no greater than 3/10 for improved comfort and functional ability Baseline: 10/10 at worst 11/10/2022: 6/10 at wrost Goal status: IMPROVED   3.  Pt will increase 30 Second Sit to Stand rep count to no less than 9 reps for improved balance, strength, and functional mobility Baseline: 6 reps  11/10/2021: 10 reps Goal status: MET   4.  Pt will be able to stand at least 30 minutes without increase in left hip pain for improved comfort and functional ability when performing home ADLs Baseline: 15  minutes 11/10/2022: 15 minutes 12/02/2022: 30 minutes Goal status: MET   5.  Pt will increase left hip flex/abd to no less than 4/5 for improved functional mobility Baseline: see chart Goal status: MET   PLAN:   PT FREQUENCY: 2x/week   PT DURATION: 4 weeks   PLANNED INTERVENTIONS: Therapeutic exercises, Therapeutic activity, Neuromuscular re-education, Balance training, Gait training, Patient/Family education, Self Care, Joint mobilization, Aquatic Therapy, Dry Needling, Electrical stimulation, Cryotherapy, Moist heat, Manual therapy, and Re-evaluation   PLAN FOR NEXT SESSION: assess HEP response, proximal hip strenghtening   Ward Chatters, PT 12/02/2022, 11:24 AM

## 2022-12-29 ENCOUNTER — Other Ambulatory Visit: Payer: Self-pay

## 2022-12-30 ENCOUNTER — Other Ambulatory Visit (HOSPITAL_COMMUNITY): Payer: Self-pay

## 2022-12-30 ENCOUNTER — Other Ambulatory Visit: Payer: Self-pay

## 2022-12-31 ENCOUNTER — Other Ambulatory Visit: Payer: Self-pay

## 2022-12-31 ENCOUNTER — Ambulatory Visit
Admission: RE | Admit: 2022-12-31 | Discharge: 2022-12-31 | Disposition: A | Discharge status: Home / Self Care | Payer: 59 | Source: Ambulatory Visit | Attending: Nurse Practitioner | Admitting: Nurse Practitioner

## 2022-12-31 DIAGNOSIS — R053 Chronic cough: Secondary | ICD-10-CM | POA: Diagnosis not present

## 2022-12-31 DIAGNOSIS — R911 Solitary pulmonary nodule: Secondary | ICD-10-CM

## 2023-01-04 ENCOUNTER — Telehealth: Payer: Self-pay | Admitting: Nurse Practitioner

## 2023-01-04 NOTE — Telephone Encounter (Signed)
Pt LVM on nurse line 01/04/2023 at 1:03 PM stating she missed a call from this number. Please return call. [phone]

## 2023-01-05 ENCOUNTER — Ambulatory Visit: Payer: Self-pay | Admitting: Nurse Practitioner

## 2023-01-24 ENCOUNTER — Other Ambulatory Visit: Payer: Self-pay

## 2023-01-24 ENCOUNTER — Encounter: Payer: Self-pay | Admitting: Nurse Practitioner

## 2023-01-24 ENCOUNTER — Ambulatory Visit (INDEPENDENT_AMBULATORY_CARE_PROVIDER_SITE_OTHER): Payer: 59 | Admitting: Nurse Practitioner

## 2023-01-24 VITALS — BP 134/85 | HR 79 | Temp 97.0°F | Ht 62.0 in | Wt 216.4 lb

## 2023-01-24 DIAGNOSIS — M25552 Pain in left hip: Secondary | ICD-10-CM

## 2023-01-24 DIAGNOSIS — M25562 Pain in left knee: Secondary | ICD-10-CM | POA: Diagnosis not present

## 2023-01-24 DIAGNOSIS — M25511 Pain in right shoulder: Secondary | ICD-10-CM | POA: Diagnosis not present

## 2023-01-24 DIAGNOSIS — I10 Essential (primary) hypertension: Secondary | ICD-10-CM

## 2023-01-24 DIAGNOSIS — Z13220 Encounter for screening for lipoid disorders: Secondary | ICD-10-CM | POA: Diagnosis not present

## 2023-01-24 MED ORDER — IBUPROFEN 800 MG PO TABS
800.0000 mg | ORAL_TABLET | Freq: Three times a day (TID) | ORAL | 6 refills | Status: DC | PRN
  Filled 2023-01-24: qty 30, 10d supply, fill #0
  Filled 2023-03-23: qty 30, 10d supply, fill #1
  Filled 2023-04-19 – 2023-05-19 (×2): qty 30, 10d supply, fill #2
  Filled 2023-06-22: qty 30, 10d supply, fill #3
  Filled 2023-07-25: qty 30, 10d supply, fill #4
  Filled 2023-08-17: qty 30, 10d supply, fill #5
  Filled 2023-09-15: qty 30, 10d supply, fill #6

## 2023-01-24 MED ORDER — PREDNISONE 10 MG PO TABS
ORAL_TABLET | ORAL | 0 refills | Status: AC
  Filled 2023-01-24: qty 20, 8d supply, fill #0

## 2023-01-24 MED ORDER — KETOROLAC TROMETHAMINE 30 MG/ML IJ SOLN
30.0000 mg | Freq: Once | INTRAMUSCULAR | Status: AC
  Administered 2023-01-24: 30 mg via INTRAMUSCULAR

## 2023-01-24 NOTE — Assessment & Plan Note (Signed)
-   ibuprofen (ADVIL) 800 MG tablet; Take 1 tablet (800 mg total) by mouth every 8 (eight) hours as needed.  Dispense: 30 tablet; Refill: 6  2. Lipid screening  - Lipid Panel  3. Essential hypertension  - CBC - Comprehensive metabolic panel  4. Left hip pain  - DG Hip Unilat W OR W/O Pelvis 2-3 Views Left - predniSONE (DELTASONE) 10 MG tablet; Take 4 tabs for 2 days, then 3 tabs for 2 days, then 2 tabs for 2 days, then 1 tab for 2 days, then stop  Dispense: 20 tablet; Refill: 0 - ketorolac (TORADOL) 30 MG/ML injection 30 mg  5. Acute pain of right shoulder  - DG Shoulder Right - predniSONE (DELTASONE) 10 MG tablet; Take 4 tabs for 2 days, then 3 tabs for 2 days, then 2 tabs for 2 days, then 1 tab for 2 days, then stop  Dispense: 20 tablet; Refill: 0 - ketorolac (TORADOL) 30 MG/ML injection 30 mg    Follow up:  Follow up in 6 months

## 2023-01-24 NOTE — Patient Instructions (Addendum)
1. Acute pain of left knee  - ibuprofen (ADVIL) 800 MG tablet; Take 1 tablet (800 mg total) by mouth every 8 (eight) hours as needed.  Dispense: 30 tablet; Refill: 6  2. Lipid screening  - Lipid Panel  3. Essential hypertension  - CBC - Comprehensive metabolic panel  4. Left hip pain  - DG Hip Unilat W OR W/O Pelvis 2-3 Views Left - predniSONE (DELTASONE) 10 MG tablet; Take 4 tabs for 2 days, then 3 tabs for 2 days, then 2 tabs for 2 days, then 1 tab for 2 days, then stop  Dispense: 20 tablet; Refill: 0 - ketorolac (TORADOL) 30 MG/ML injection 30 mg  5. Acute pain of right shoulder  - DG Shoulder Right - predniSONE (DELTASONE) 10 MG tablet; Take 4 tabs for 2 days, then 3 tabs for 2 days, then 2 tabs for 2 days, then 1 tab for 2 days, then stop  Dispense: 20 tablet; Refill: 0 - ketorolac (TORADOL) 30 MG/ML injection 30 mg    Follow up:  Follow up in 6 months

## 2023-01-24 NOTE — Progress Notes (Signed)
@Patient  ID: Deanna Floyd, female    DOB: November 18, 1960, 62 y.o.   MRN: [id number]  Chief Complaint  Patient presents with   Annual Exam    Four fasting     Referring provider: Fenton Foy, NP   HPI  Deanna Floyd 62 y.o. female  has a past medical history of GERD (gastroesophageal reflux disease), Hypertension, and Stroke (2007).    Patient presents today for a physical.  Overall she is doing well.  She does complain of left hip pain for the past 2 weeks.  She states that the pain actually starts in her low Floyd and radiates down through her left hip and leg.  We discussed that most likely this is sciatica.  We will get x-ray of her hip.  She also complains of right shoulder pain.  She denies any recent injury.  She does have good range of motion in her right shoulder.  We will order an x-ray. Denies f/c/s, n/v/d, hemoptysis, PND, leg swelling Denies chest pain or edema     Allergies  Allergen Reactions   Hydrocortisone Hives   Lyrica [Pregabalin] Rash    Immunization History  Administered Date(s) Administered   Influenza,inj,Quad PF,6+ Mos 10/06/2015, 08/06/2016, 07/04/2017, 08/01/2018, 08/21/2019, 12/21/2021   PFIZER(Purple Top)SARS-COV-2 Vaccination 04/12/2020, 05/03/2020, 04/08/2021    Past Medical History:  Diagnosis Date   GERD (gastroesophageal reflux disease)    Hypertension    Stroke ([place]) 2007    Tobacco History: Social History   Tobacco Use  Smoking Status Former   Packs/day: 0.25   Years: 10.00   Additional pack years: 0.00   Total pack years: 2.50   Types: Cigarettes   Quit date: 2012   Years since quitting: 12.2  Smokeless Tobacco Never   Counseling given: Not Answered   Outpatient Encounter Medications as of 01/24/2023  Medication Sig   amLODipine (NORVASC) 5 MG tablet Take 1 tablet (5 mg total) by mouth daily.   cyclobenzaprine (FLEXERIL) 10 MG tablet Take 1 tablet (10 mg total) by mouth 3 (three) times daily as needed for muscle  spasms.   diclofenac Sodium (VOLTAREN) 1 % GEL Apply 4 g topically 4 (four) times daily.   gabapentin (NEURONTIN) 300 MG capsule Take 1 capsule (300 mg total) by mouth 2 (two) times daily. As needed.   Latanoprostene Bunod (VYZULTA) 0.024 % SOLN Place 1 drop into both eyes every evening.   lisinopril-hydrochlorothiazide (ZESTORETIC) 20-12.5 MG tablet TAKE 1 TABLET BY MOUTH DAILY.   meloxicam (MOBIC) 15 MG tablet Take 1 tablet (15 mg total) by mouth daily.   Multiple Vitamin (MULTIVITAMIN WITH MINERALS) TABS tablet Take 1 tablet by mouth daily.   Olopatadine HCl 0.2 % SOLN Place 1 drop into both eyes daily. One drop into each eye daily, as needed.   omeprazole (PRILOSEC) 40 MG capsule Take 1 capsule (40 mg total) by mouth daily.   predniSONE (DELTASONE) 10 MG tablet Take 4 tablets (40 mg total) by mouth daily for 2 days, THEN 3 tablets (30 mg total) daily for 2 days, THEN 2 tablets (20 mg total) daily for 2 days, THEN 1 tablet (10 mg total) daily for 2 days, then stop.   [DISCONTINUED] ibuprofen (ADVIL) 800 MG tablet Take 1 tablet (800 mg total) by mouth every 8 (eight) hours as needed.   diphenhydrAMINE (BENADRYL) 2 % cream Apply topically 3 (three) times daily as needed for itching. (Patient not taking: Reported on 01/24/2023)   ibuprofen (ADVIL) 800 MG tablet Take 1 tablet (  800 mg total) by mouth every 8 (eight) hours as needed.   mupirocin ointment (BACTROBAN) 2 % Apply 1 Application topically 2 (two) times daily. (Patient not taking: Reported on 01/24/2023)   [EXPIRED] ketorolac (TORADOL) 30 MG/ML injection 30 mg    No facility-administered encounter medications on file as of 01/24/2023.     Review of Systems  Review of Systems  Constitutional: Negative.   HENT: Negative.    Cardiovascular: Negative.   Gastrointestinal: Negative.   Allergic/Immunologic: Negative.   Neurological: Negative.   Psychiatric/Behavioral: Negative.         Physical Exam  BP 134/85   Pulse 79   Temp (!)  97 F (36.1 C)   Ht 5\' 2"  (1.575 m)   Wt 216 lb 6.4 oz (98.2 kg)   SpO2 99%   BMI 39.58 kg/m   Wt Readings from Last 5 Encounters:  01/24/23 216 lb 6.4 oz (98.2 kg)  11/18/22 209 lb (94.8 kg)  09/17/22 209 lb (94.8 kg)  06/17/22 206 lb 12.8 oz (93.8 kg)  06/02/22 209 lb (94.8 kg)     Physical Exam Vitals and nursing note reviewed.  Constitutional:      General: She is not in acute distress.    Appearance: She is well-developed.  Cardiovascular:     Rate and Rhythm: Normal rate and regular rhythm.  Pulmonary:     Effort: Pulmonary effort is normal.     Breath sounds: Normal breath sounds.  Neurological:     Mental Status: She is alert and oriented to person, place, and time.      Lab Results:  CBC    Component Value Date/Time   WBC 5.2 09/17/2022 1342   WBC 4.9 06/07/2016 1600   RBC 4.06 09/17/2022 1342   RBC 3.54 (L) 06/07/2016 1600   HGB 13.3 09/17/2022 1342   HCT 39.8 09/17/2022 1342   PLT 238 09/17/2022 1342   MCV 98 (H) 09/17/2022 1342   MCH 32.8 09/17/2022 1342   MCH 31.6 06/07/2016 1600   MCHC 33.4 09/17/2022 1342   MCHC 32.8 06/07/2016 1600   RDW 12.3 09/17/2022 1342   LYMPHSABS 1.7 03/09/2022 1405   MONOABS 441 06/07/2016 1600   EOSABS 0.1 03/09/2022 1405   BASOSABS 0.0 03/09/2022 1405    BMET    Component Value Date/Time   NA 143 09/17/2022 1342   K 3.8 09/17/2022 1342   CL 103 09/17/2022 1342   CO2 23 09/17/2022 1342   GLUCOSE 97 09/17/2022 1342   GLUCOSE 106 (H) 11/12/2021 1558   BUN 10 09/17/2022 1342   CREATININE 0.74 09/17/2022 1342   CREATININE 0.82 11/19/2016 1105   CALCIUM 9.3 09/17/2022 1342   GFRNONAA 87 03/12/2020 1420   GFRNONAA 80 11/19/2016 1105   GFRAA 100 03/12/2020 1420   GFRAA >89 11/19/2016 1105    BNP No results found for: "BNP"  ProBNP No results found for: "PROBNP"  Imaging: CT Chest Wo Contrast  Result Date: 01/03/2023 CLINICAL DATA:  Chronic cough EXAM: CT CHEST WITHOUT CONTRAST TECHNIQUE:  Multidetector CT imaging of the chest was performed following the standard protocol without IV contrast. RADIATION DOSE REDUCTION: This exam was performed according to the departmental dose-optimization program which includes automated exposure control, adjustment of the mA and/or kV according to patient size and/or use of iterative reconstruction technique. COMPARISON:  None Available. FINDINGS: Cardiovascular: The heart is normal in size. No pericardial effusion. No evidence of thoracic aortic aneurysm. Mediastinum/Nodes: No suspicious mediastinal lymphadenopathy. Visualized thyroid is  unremarkable. Lungs/Pleura: Mild subpleural reticulation/fibrosis in the lower lobes, right greater than left, with scarring in the lateral right lower lobe, likely post infectious inflammatory. No suspicious pulmonary nodules. No focal consolidation. No pleural effusion or pneumothorax. Upper Abdomen: Visualized upper abdomen is grossly unremarkable. Musculoskeletal: Visualized osseous structures are within normal limits. IMPRESSION: No evidence of acute cardiopulmonary disease. Suspected post infectious inflammatory scarring in the lower lobes, right greater than left. Electronically Signed   By: Julian Hy M.D.   On: 01/03/2023 02:40     Assessment & Plan:   Left hip pain - ibuprofen (ADVIL) 800 MG tablet; Take 1 tablet (800 mg total) by mouth every 8 (eight) hours as needed.  Dispense: 30 tablet; Refill: 6  2. Lipid screening  - Lipid Panel  3. Essential hypertension  - CBC - Comprehensive metabolic panel  4. Left hip pain  - DG Hip Unilat W OR W/O Pelvis 2-3 Views Left - predniSONE (DELTASONE) 10 MG tablet; Take 4 tabs for 2 days, then 3 tabs for 2 days, then 2 tabs for 2 days, then 1 tab for 2 days, then stop  Dispense: 20 tablet; Refill: 0 - ketorolac (TORADOL) 30 MG/ML injection 30 mg  5. Acute pain of right shoulder  - DG Shoulder Right - predniSONE (DELTASONE) 10 MG tablet; Take 4 tabs for  2 days, then 3 tabs for 2 days, then 2 tabs for 2 days, then 1 tab for 2 days, then stop  Dispense: 20 tablet; Refill: 0 - ketorolac (TORADOL) 30 MG/ML injection 30 mg    Follow up:  Follow up in 6 months     Fenton Foy, NP 01/24/2023

## 2023-01-25 LAB — COMPREHENSIVE METABOLIC PANEL
ALT: 18 IU/L (ref 0–32)
AST: 18 IU/L (ref 0–40)
Albumin/Globulin Ratio: 1.8 (ref 1.2–2.2)
Albumin: 4.2 g/dL (ref 3.9–4.9)
Alkaline Phosphatase: 71 IU/L (ref 44–121)
BUN/Creatinine Ratio: 18 (ref 12–28)
BUN: 13 mg/dL (ref 8–27)
Bilirubin Total: <0.2 mg/dL (ref 0.0–1.2)
CO2: 24 mmol/L (ref 20–29)
Calcium: 9.4 mg/dL (ref 8.7–10.3)
Chloride: 104 mmol/L (ref 96–106)
Creatinine, Ser: 0.73 mg/dL (ref 0.57–1.00)
Globulin, Total: 2.4 g/dL (ref 1.5–4.5)
Glucose: 89 mg/dL (ref 70–99)
Potassium: 3.9 mmol/L (ref 3.5–5.2)
Sodium: 141 mmol/L (ref 134–144)
Total Protein: 6.6 g/dL (ref 6.0–8.5)
eGFR: 93 mL/min/{1.73_m2} (ref >59)

## 2023-01-25 LAB — LIPID PANEL
Chol/HDL Ratio: 3 ratio (ref 0.0–4.4)
Cholesterol, Total: 210 mg/dL — ABNORMAL HIGH (ref 100–199)
HDL: 69 mg/dL (ref >39)
LDL Chol Calc (NIH): 124 mg/dL — ABNORMAL HIGH (ref 0–99)
Triglycerides: 98 mg/dL (ref 0–149)
VLDL Cholesterol Cal: 17 mg/dL (ref 5–40)

## 2023-01-25 LAB — CBC
Hematocrit: 35.3 % (ref 34.0–46.6)
Hemoglobin: 11.6 g/dL (ref 11.1–15.9)
MCH: 31.8 pg (ref 26.6–33.0)
MCHC: 32.9 g/dL (ref 31.5–35.7)
MCV: 97 fL (ref 79–97)
Platelets: 329 10*3/uL (ref 150–450)
RBC: 3.65 x10E6/uL — ABNORMAL LOW (ref 3.77–5.28)
RDW: 12.1 % (ref 11.7–15.4)
WBC: 5.4 10*3/uL (ref 3.4–10.8)

## 2023-01-26 ENCOUNTER — Ambulatory Visit (HOSPITAL_COMMUNITY)
Admission: RE | Admit: 2023-01-26 | Discharge: 2023-01-26 | Disposition: A | Discharge status: Home / Self Care | Payer: 59 | Source: Ambulatory Visit | Attending: Nurse Practitioner | Admitting: Nurse Practitioner

## 2023-01-26 DIAGNOSIS — M25511 Pain in right shoulder: Secondary | ICD-10-CM | POA: Insufficient documentation

## 2023-01-26 DIAGNOSIS — M25552 Pain in left hip: Secondary | ICD-10-CM | POA: Insufficient documentation

## 2023-01-26 DIAGNOSIS — M19011 Primary osteoarthritis, right shoulder: Secondary | ICD-10-CM | POA: Diagnosis not present

## 2023-01-26 NOTE — Progress Notes (Signed)
Called pt and inform results.Gh 

## 2023-01-27 ENCOUNTER — Telehealth: Payer: Self-pay

## 2023-01-27 NOTE — Telephone Encounter (Signed)
Pt is requesting  referral for P/T for right shoulder and left hip. Please advise [facility]

## 2023-01-31 ENCOUNTER — Other Ambulatory Visit: Payer: Self-pay | Admitting: Nurse Practitioner

## 2023-01-31 DIAGNOSIS — M25552 Pain in left hip: Secondary | ICD-10-CM

## 2023-01-31 DIAGNOSIS — G8929 Other chronic pain: Secondary | ICD-10-CM

## 2023-02-02 ENCOUNTER — Other Ambulatory Visit (HOSPITAL_COMMUNITY): Payer: Self-pay

## 2023-02-02 DIAGNOSIS — H40033 Anatomical narrow angle, bilateral: Secondary | ICD-10-CM | POA: Diagnosis not present

## 2023-02-02 DIAGNOSIS — H401131 Primary open-angle glaucoma, bilateral, mild stage: Secondary | ICD-10-CM | POA: Diagnosis not present

## 2023-02-02 LAB — HM DIABETES EYE EXAM

## 2023-02-02 MED ORDER — VYZULTA 0.024 % OP SOLN
1.0000 [drp] | Freq: Every evening | OPHTHALMIC | 4 refills | Status: AC
  Filled 2023-02-02 – 2023-05-19 (×2): qty 5, 50d supply, fill #0
  Filled 2023-12-30: qty 5, 50d supply, fill #1

## 2023-02-02 MED ORDER — DORZOLAMIDE HCL-TIMOLOL MAL 2-0.5 % OP SOLN
1.0000 [drp] | Freq: Two times a day (BID) | OPHTHALMIC | 1 refills | Status: DC
  Filled 2023-02-02: qty 10, 25d supply, fill #0
  Filled 2023-02-24: qty 10, 50d supply, fill #1
  Filled 2023-02-24: qty 10, 50d supply, fill #0

## 2023-02-03 ENCOUNTER — Other Ambulatory Visit: Payer: Self-pay

## 2023-02-15 ENCOUNTER — Ambulatory Visit (INDEPENDENT_AMBULATORY_CARE_PROVIDER_SITE_OTHER): Payer: 59 | Admitting: Orthopaedic Surgery

## 2023-02-15 ENCOUNTER — Other Ambulatory Visit: Payer: Self-pay

## 2023-02-15 DIAGNOSIS — M25511 Pain in right shoulder: Secondary | ICD-10-CM | POA: Diagnosis not present

## 2023-02-15 DIAGNOSIS — M1612 Unilateral primary osteoarthritis, left hip: Secondary | ICD-10-CM | POA: Diagnosis not present

## 2023-02-15 DIAGNOSIS — G8929 Other chronic pain: Secondary | ICD-10-CM

## 2023-02-15 MED ORDER — MELOXICAM 7.5 MG PO TABS
7.5000 mg | ORAL_TABLET | Freq: Two times a day (BID) | ORAL | 2 refills | Status: DC | PRN
  Filled 2023-02-15: qty 30, 15d supply, fill #0

## 2023-02-15 NOTE — Progress Notes (Signed)
Office Visit Note   Patient: Deanna Floyd           Date of Birth: Aug 08, 1961           MRN: [id number] Visit Date: 02/15/2023              Requested by: Ivonne Andrew, NP [phone] [address] [place],  [place] [address] PCP: Ivonne Andrew, NP   Assessment & Plan: Visit Diagnoses:  1. Primary osteoarthritis of left hip   2. Chronic right shoulder pain     Plan: Impression is 62 year old female with right shoulder glenohumeral and left hip osteoarthritis.  Currently appears that her disease is mild.  The symptoms are also relatively mild.  Disease process explained in detail and treatment options were explained as well.  For now the patient would like to just try to manage with NSAIDs.  She has my card.  Follow-up as needed.  Follow-Up Instructions: No follow-ups on file.   Orders:  No orders of the defined types were placed in this encounter.  No orders of the defined types were placed in this encounter.     Procedures: No procedures performed   Clinical Data: No additional findings.   Subjective: Chief Complaint  Patient presents with   Right Shoulder - Pain   Left Hip - Pain    HPI  Patient is a very pleasant 62 year old female comes in for right shoulder pain and left hip pain.  For the shoulder she feels pain along the joint line and for the left hip feels pain in the groin.  Worse with activity and weather changes.  Denies any radicular symptoms.  Denies any injuries.  Both joints have been hurting for about a month.  Review of Systems  Constitutional: Negative.   HENT: Negative.    Eyes: Negative.   Respiratory: Negative.    Cardiovascular: Negative.   Endocrine: Negative.   Musculoskeletal: Negative.   Neurological: Negative.   Hematological: Negative.   Psychiatric/Behavioral: Negative.    All other systems reviewed and are negative.    Objective: Vital Signs: There were no vitals taken for this visit.  Physical Exam Vitals and  nursing note reviewed.  Constitutional:      Appearance: She is well-developed.  HENT:     Head: Atraumatic.     Nose: Nose normal.  Eyes:     Extraocular Movements: Extraocular movements intact.  Cardiovascular:     Pulses: Normal pulses.  Pulmonary:     Effort: Pulmonary effort is normal.  Abdominal:     Palpations: Abdomen is soft.  Musculoskeletal:     Cervical back: Neck supple.  Skin:    General: Skin is warm.     Capillary Refill: Capillary refill takes less than 2 seconds.  Neurological:     Mental Status: She is alert. Mental status is at baseline.  Psychiatric:        Behavior: Behavior normal.        Thought Content: Thought content normal.        Judgment: Judgment normal.     Ortho Exam  Examination right shoulder is nonfocal.  Has pain along the anterior joint line with range of motion.  Normal range of motion.  Good strength with manual muscle testing of the rotator cuff.  Examination of the left hip shows mild pain with hip range of motion.  Range of motion is preserved.  No sciatic tension signs.  Specialty Comments:  No specialty comments available.  Imaging: No results found.   PMFS History: Patient Active Problem List   Diagnosis Date Noted   Acute pain of left knee 01/24/2023   Left hip pain 01/24/2023   Axillary lump, left 09/17/2022   Solitary pulmonary nodule 06/17/2022   Generalized abdominal pain 11/12/2021   Loss of weight 11/12/2021   Right wrist pain 03/27/2020   Neuropathy 08/21/2019   Chronic foot pain, left 08/21/2019   Seasonal allergies 08/21/2019   Right upper quadrant pain 05/08/2019   Gastroesophageal reflux disease with esophagitis 05/08/2019   Hot flashes due to menopause 05/08/2019   Carpal tunnel syndrome on both sides 11/19/2016   Metabolic syndrome 11/19/2016   Essential hypertension 11/19/2016   Allergic dermatitis 11/19/2016   Allergy status to unspecified drugs, medicaments and biological substances status  11/19/2016   BP check 09/22/2016   Post-operative state 06/02/2016   Obesity 01/08/2016   Low back pain 10/06/2015   Pain in joint, ankle and foot 10/06/2015   Past Medical History:  Diagnosis Date   GERD (gastroesophageal reflux disease)    Hypertension    Stroke ([facility]) 2007    Family History  Problem Relation Age of Onset   Hypertension Mother    Hypertension Sister    Breast cancer Sister 37   Colon cancer Neg Hx    Stomach cancer Neg Hx     Past Surgical History:  Procedure Laterality Date   FOOT SURGERY  2015   left side had 2 surgeries in 2015   TRIGGER FINGER RELEASE  2010   Social History   Occupational History   Not on file  Tobacco Use   Smoking status: Former    Packs/day: 0.25    Years: 10.00    Additional pack years: 0.00    Total pack years: 2.50    Types: Cigarettes    Quit date: 2012    Years since quitting: 12.2   Smokeless tobacco: Never  Vaping Use   Vaping Use: Never used  Substance and Sexual Activity   Alcohol use: Yes    Comment: occ wine    Drug use: No   Sexual activity: Not Currently

## 2023-02-18 ENCOUNTER — Other Ambulatory Visit: Payer: Self-pay

## 2023-02-21 ENCOUNTER — Other Ambulatory Visit: Payer: Self-pay

## 2023-02-21 ENCOUNTER — Ambulatory Visit: Payer: 59

## 2023-02-23 ENCOUNTER — Other Ambulatory Visit: Payer: Self-pay

## 2023-02-24 ENCOUNTER — Other Ambulatory Visit: Payer: Self-pay

## 2023-02-25 ENCOUNTER — Other Ambulatory Visit: Payer: Self-pay

## 2023-03-12 ENCOUNTER — Emergency Department (HOSPITAL_COMMUNITY)
Admission: EM | Admit: 2023-03-12 | Discharge: 2023-03-13 | Disposition: A | Discharge status: Home / Self Care | Payer: 59 | Attending: Emergency Medicine | Admitting: Emergency Medicine

## 2023-03-12 ENCOUNTER — Encounter (HOSPITAL_COMMUNITY): Payer: Self-pay

## 2023-03-12 DIAGNOSIS — Z79899 Other long term (current) drug therapy: Secondary | ICD-10-CM | POA: Insufficient documentation

## 2023-03-12 DIAGNOSIS — M5432 Sciatica, left side: Secondary | ICD-10-CM | POA: Diagnosis not present

## 2023-03-12 DIAGNOSIS — I10 Essential (primary) hypertension: Secondary | ICD-10-CM | POA: Diagnosis not present

## 2023-03-12 DIAGNOSIS — M5442 Lumbago with sciatica, left side: Secondary | ICD-10-CM | POA: Diagnosis not present

## 2023-03-12 DIAGNOSIS — M545 Low back pain, unspecified: Secondary | ICD-10-CM | POA: Diagnosis not present

## 2023-03-12 MED ORDER — CYCLOBENZAPRINE HCL 10 MG PO TABS
5.0000 mg | ORAL_TABLET | Freq: Once | ORAL | Status: AC
  Administered 2023-03-12: 5 mg via ORAL
  Filled 2023-03-12: qty 1

## 2023-03-12 MED ORDER — KETOROLAC TROMETHAMINE 30 MG/ML IJ SOLN
30.0000 mg | Freq: Once | INTRAMUSCULAR | Status: AC
  Administered 2023-03-12: 30 mg via INTRAMUSCULAR
  Filled 2023-03-12: qty 1

## 2023-03-12 NOTE — ED Provider Notes (Signed)
[place] EMERGENCY DEPARTMENT AT [facility] Provider Note   CSN: [id number] Arrival date & time: 03/12/23  2301     History {Add pertinent medical, surgical, social history, OB history to HPI:1} Chief Complaint  Patient presents with   Back Pain    Deanna Floyd is a 62 y.o. female.  HPI     This is a 62 year old female who presents with back pain.  Patient reports 2-day history of left lower back pain.  States that the pain radiates into her left buttock.  It is worse with certain movements.  Denies injury or heavy lifting.  Denies bowel or bladder difficulty, weakness, numbness, tingling of the lower extremities.  Denies dysuria or hematuria.  Home Medications Prior to Admission medications   Medication Sig Start Date End Date Taking? Authorizing Provider  amLODipine (NORVASC) 5 MG tablet Take 1 tablet (5 mg total) by mouth daily. 08/30/22   Ivonne Andrew, NP  cyclobenzaprine (FLEXERIL) 10 MG tablet Take 1 tablet (10 mg total) by mouth 3 (three) times daily as needed for muscle spasms. 08/19/21   Barbette Merino, NP  diclofenac Sodium (VOLTAREN) 1 % GEL Apply 4 g topically 4 (four) times daily. 09/17/22   Ivonne Andrew, NP  diphenhydrAMINE (BENADRYL) 2 % cream Apply topically 3 (three) times daily as needed for itching. Patient not taking: Reported on 01/24/2023 03/12/20   Kallie Locks, FNP  dorzolamide-timolol (COSOPT) 2-0.5 % ophthalmic solution Place 1 drop into both eyes 2 (two) times daily. 02/02/23     gabapentin (NEURONTIN) 300 MG capsule Take 1 capsule (300 mg total) by mouth 2 (two) times daily. As needed. 08/19/21   Barbette Merino, NP  ibuprofen (ADVIL) 800 MG tablet Take 1 tablet (800 mg total) by mouth every 8 (eight) hours as needed. 01/24/23   Ivonne Andrew, NP  Latanoprostene Bunod (VYZULTA) 0.024 % SOLN Place 1 drop into both eyes every evening. 10/18/22     Latanoprostene Bunod (VYZULTA) 0.024 % SOLN Place 1 drop into both eyes every  evening. 02/02/23     lisinopril-hydrochlorothiazide (ZESTORETIC) 20-12.5 MG tablet TAKE 1 TABLET BY MOUTH DAILY. 08/24/22 08/24/23  Ivonne Andrew, NP  meloxicam (MOBIC) 15 MG tablet Take 1 tablet (15 mg total) by mouth daily. 09/17/22   Ivonne Andrew, NP  meloxicam (MOBIC) 7.5 MG tablet Take 1 tablet (7.5 mg total) by mouth 2 (two) times daily as needed for pain. 02/15/23   Tarry Kos, MD  Multiple Vitamin (MULTIVITAMIN WITH MINERALS) TABS tablet Take 1 tablet by mouth daily.    [provider]  mupirocin ointment (BACTROBAN) 2 % Apply 1 Application topically 2 (two) times daily. Patient not taking: Reported on 01/24/2023 06/17/22   Ivonne Andrew, NP  Olopatadine HCl 0.2 % SOLN Place 1 drop into both eyes daily. One drop into each eye daily, as needed. 08/24/22 08/24/23  Ivonne Andrew, NP  omeprazole (PRILOSEC) 40 MG capsule Take 1 capsule (40 mg total) by mouth daily. 08/24/22 08/24/23  Ivonne Andrew, NP      Allergies    Hydrocortisone and Lyrica [pregabalin]    Review of Systems   Review of Systems  Constitutional:  Negative for fever.  Musculoskeletal:  Positive for back pain.  All other systems reviewed and are negative.   Physical Exam Updated Vital Signs BP (!) 139/91 (BP Location: Left Arm)   Pulse 73   Temp 97.8 F (36.6 C) (Oral)   Resp 16  Ht 1.549 m (5\' 1" )   Wt 95.3 kg   SpO2 100%   BMI 39.68 kg/m  Physical Exam Vitals and nursing note reviewed.  Constitutional:      Appearance: She is well-developed. She is obese. She is not ill-appearing.  HENT:     Head: Normocephalic and atraumatic.  Eyes:     Pupils: Pupils are equal, round, and reactive to light.  Cardiovascular:     Rate and Rhythm: Normal rate and regular rhythm.  Pulmonary:     Effort: Pulmonary effort is normal. No respiratory distress.  Abdominal:     Palpations: Abdomen is soft.  Musculoskeletal:     Cervical back: Neck supple.     Comments: Tenderness to palpation left  lower proximal paraspinous muscle region of the lumbar spine, no midline tenderness to palpation, step-off, deformity, positive straight leg raise  Skin:    General: Skin is warm and dry.  Neurological:     Mental Status: She is alert and oriented to person, place, and time.     Comments: 5 out of 5 strength bilateral lower extremities, no clonus  Psychiatric:        Mood and Affect: Mood normal.     ED Results / Procedures / Treatments   Labs (all labs ordered are listed, but only abnormal results are displayed) Labs Reviewed  URINALYSIS, ROUTINE W REFLEX MICROSCOPIC    EKG None  Radiology No results found.  Procedures Procedures  {Document cardiac monitor, telemetry assessment procedure when appropriate:1}  Medications Ordered in ED Medications  ketorolac (TORADOL) 30 MG/ML injection 30 mg (has no administration in time range)  cyclobenzaprine (FLEXERIL) tablet 5 mg (has no administration in time range)    ED Course/ Medical Decision Making/ A&P   {   Click here for ABCD2, HEART and other calculatorsREFRESH Note before signing :1}                          Medical Decision Making Amount and/or Complexity of Data Reviewed Labs: ordered.  Risk Prescription drug management.   ***  {Document critical care time when appropriate:1} {Document review of labs and clinical decision tools ie heart score, Chads2Vasc2 etc:1}  {Document your independent review of radiology images, and any outside records:1} {Document your discussion with family members, caretakers, and with consultants:1} {Document social determinants of health affecting pt's care:1} {Document your decision making why or why not admission, treatments were needed:1} Final Clinical Impression(s) / ED Diagnoses Final diagnoses:  None    Rx / DC Orders ED Discharge Orders     None

## 2023-03-12 NOTE — ED Triage Notes (Signed)
Pt presents to ED for evaluation of left lower back pain described as sharp x 2 days

## 2023-03-13 DIAGNOSIS — M5432 Sciatica, left side: Secondary | ICD-10-CM | POA: Diagnosis not present

## 2023-03-13 LAB — URINALYSIS, ROUTINE W REFLEX MICROSCOPIC
Bilirubin Urine: NEGATIVE
Glucose, UA: NEGATIVE mg/dL
Hgb urine dipstick: NEGATIVE
Ketones, ur: NEGATIVE mg/dL
Leukocytes,Ua: NEGATIVE
Nitrite: NEGATIVE
Protein, ur: NEGATIVE mg/dL
Specific Gravity, Urine: 1.006 (ref 1.005–1.030)
pH: 5 (ref 5.0–8.0)

## 2023-03-13 MED ORDER — METHYLPREDNISOLONE 4 MG PO TBPK: ORAL_TABLET | ORAL | 0 refills | Status: DC

## 2023-03-13 NOTE — Discharge Instructions (Addendum)
You were seen today for back pain.  Your exam is consistent with sciatica.  You will be discharged with an anti-inflammatory medication.  Make sure that you are stretching.  If not improving, you may need to follow-up with your primary doctor to be set up with physical therapy.

## 2023-03-14 DIAGNOSIS — H401131 Primary open-angle glaucoma, bilateral, mild stage: Secondary | ICD-10-CM | POA: Diagnosis not present

## 2023-03-18 ENCOUNTER — Ambulatory Visit: Payer: Self-pay | Admitting: Nurse Practitioner

## 2023-03-23 ENCOUNTER — Other Ambulatory Visit: Payer: Self-pay

## 2023-03-28 ENCOUNTER — Other Ambulatory Visit: Payer: Self-pay

## 2023-04-07 NOTE — Therapy (Signed)
OUTPATIENT PHYSICAL THERAPY SHOULDER EVALUATION   Patient Name: Deanna Floyd MRN: [id number] DOB:May 18, 1961, 62 y.o., female Today's Date: 04/08/2023  END OF SESSION:  PT End of Session - 04/08/23 1017     Visit Number 1    Authorization Type [employer]    PT Start Time 1017    PT Stop Time 1046    PT Time Calculation (min) 29 min    Activity Tolerance Patient tolerated treatment well    Behavior During Therapy WFL for tasks assessed/performed             Past Medical History:  Diagnosis Date   GERD (gastroesophageal reflux disease)    Hypertension    Stroke (HCC) 2007   Past Surgical History:  Procedure Laterality Date   FOOT SURGERY  2015   left side had 2 surgeries in 2015   TRIGGER FINGER RELEASE  2010   Patient Active Problem List   Diagnosis Date Noted   Acute pain of left knee 01/24/2023   Left hip pain 01/24/2023   Axillary lump, left 09/17/2022   Solitary pulmonary nodule 06/17/2022   Generalized abdominal pain 11/12/2021   Loss of weight 11/12/2021   Right wrist pain 03/27/2020   Neuropathy 08/21/2019   Chronic foot pain, left 08/21/2019   Seasonal allergies 08/21/2019   Right upper quadrant pain 05/08/2019   Gastroesophageal reflux disease with esophagitis 05/08/2019   Hot flashes due to menopause 05/08/2019   Carpal tunnel syndrome on both sides 11/19/2016   Metabolic syndrome 11/19/2016   Essential hypertension 11/19/2016   Allergic dermatitis 11/19/2016   Allergy status to unspecified drugs, medicaments and biological substances status 11/19/2016   BP check 09/22/2016   Post-operative state 06/02/2016   Obesity 01/08/2016   Low back pain 10/06/2015   Pain in joint, ankle and foot 10/06/2015    PCP: Ivonne Andrew, NP   REFERRING PROVIDER:   Ivonne Andrew, NP    REFERRING DIAG: [phone] (ICD-10-CM) - Chronic right shoulder pain   THERAPY DIAG:  Muscle weakness (generalized)  Rationale for Evaluation and Treatment:  Rehabilitation  ONSET DATE: a couple of months ago  SUBJECTIVE:                                                                                                                                                                                      SUBJECTIVE STATEMENT: Pt states one day she woke up with R shoulder pain, thought she may have slept wrong. Gradually worsened. States she had a flare up with her prior hip symptoms as well. Denies any changes in activity or injuries. States she went to ED and they gave her some  medication which helped both her hip and her shoulder. States that since then she hasn't had any shoulder pain and her hip seems to have returned to baseline. States her primary concern is her hip and would like to return to PT for this.  Hand dominance: Right  PERTINENT HISTORY: GERD, HTN, stroke, multiple MSK issues  PAIN:  No shoulder pain at present or in past few weeks, no limitations in daily activities due to shoulder pain per pt  PRECAUTIONS: None  WEIGHT BEARING RESTRICTIONS: No  FALLS:  Has patient fallen in last 6 months? 1 fall towards the end of 2023, slipped in the tub  LIVING ENVIRONMENT: Lives w/ 2 sons, assist with housework  OCCUPATION: Retired - police   PLOF: Independent - family does majority of housework per pt   PATIENT GOALS: work on hip   NEXT MD VISIT: September   OBJECTIVE:   DIAGNOSTIC FINDINGS:  01/27/23 R shoulder XR per EPIC  "IMPRESSION: Mild AC and glenohumeral degenerative changes."  PATIENT SURVEYS:  FOTO not indicated  COGNITION: Overall cognitive status: Within functional limits for tasks assessed     SENSATION: Does not endorse sensory complaints  POSTURE: Mild forward head/rounded shoulders  UPPER EXTREMITY ROM:  A/PROM Right eval Left eval  Shoulder flexion 162 deg 166  Shoulder abduction 148 deg 150 deg  Shoulder internal rotation    Shoulder external rotation    Elbow flexion    Elbow extension     Wrist flexion    Wrist extension     (Blank rows = not tested) (Key: WFL = within functional limits not formally assessed, * = concordant pain, s = stiffness/stretching sensation, NT = not tested)  Comments: all movement painless; cervical ROM painless and grossly WFL for flex/ext and rotation BIL  UPPER EXTREMITY MMT:  MMT Right eval Left eval  Shoulder flexion 4+ 5  Shoulder extension    Shoulder abduction 4+ 5  Shoulder extension    Shoulder internal rotation 4+ 5  Shoulder external rotation 4+ 5  Elbow flexion    Elbow extension    Grip strength    (Blank rows = not tested)  (Key: WFL = within functional limits not formally assessed, * = concordant pain, s = stiffness/stretching sensation, NT = not tested)  Comments: MMT nonpainful  SHOULDER SPECIAL TESTS: Negative neers BIL   JOINT MOBILITY TESTING:  NT  PALPATION:  Nontender throughout GH musculature and periscapular/cervical musculature   TODAY'S TREATMENT:                                                                                                                                         [facility] Adult PT Treatment:  DATE: 04/08/23 Not indicated  PATIENT EDUCATION: Education details: education on PT exam, informed consent, symptom behavior, pt goals, role of PT, follow up with provider Person educated: Patient Education method: Explanation, Demonstration, Tactile cues, Verbal cues Education comprehension: verbalized understanding, returned demonstration, verbal cues required, tactile cues required, and needs further education    HOME EXERCISE PROGRAM: Not indicated  ASSESSMENT:  CLINICAL IMPRESSION: Pt is a pleasant 62 year old woman who arrives to PT evaluation on this date for R shoulder pain. Pt notes that her symptoms have since resolved and she has not had any pain or limitations over past few weeks. Upon examination pt demos grossly symmetrical GH AROM that  is painless, mild global R GH weakness although this is painless and pt endorses that this is chronic. Palpation exam unremarkable and without pain. Extensive discussion w/ pt re: her symptom behavior and her tolerance to ADLs/mobility, she states she would like to discharge PT for her shoulder as it is no longer giving her issues, but would like to return to PT for her ongoing hip issues. Pt is advised to follow up with her provider to discuss appropriateness of hip referral as for now we only have shoulder referral. Pt verbalizes agreement/understanding with plan, states she will follow up with her provider next week. No adverse events, tolerates exam well. Pt departs today's session in no acute distress, all voiced questions/concerns addressed appropriately from PT perspective.    OBJECTIVE IMPAIRMENTS: no relevant impairments identified   ACTIVITY LIMITATIONS: denies limitations due to shoulder  PARTICIPATION LIMITATIONS: denies limitations due to shoulder  PERSONAL FACTORS: 1 comorbidity: HTN  are also affecting patient's functional outcome.   REHAB POTENTIAL: Good  CLINICAL DECISION MAKING: Stable/uncomplicated  EVALUATION COMPLEXITY: Low   GOALS: Short term and long term goals not indicated; evaluation and discharge for R shoulder  PLAN: Eval and Discharge 04/08/23  PT FREQUENCY: NA  PT DURATION: NA  PLANNED INTERVENTIONS: NA  PLAN FOR NEXT SESSION: NA; discharge to provider follow up   Ashley Murrain PT, DPT 04/08/2023 11:07 AM

## 2023-04-08 ENCOUNTER — Ambulatory Visit: Payer: 59 | Attending: Nurse Practitioner | Admitting: Physical Therapy

## 2023-04-08 ENCOUNTER — Other Ambulatory Visit: Payer: Self-pay

## 2023-04-08 ENCOUNTER — Encounter: Payer: Self-pay | Admitting: Physical Therapy

## 2023-04-08 DIAGNOSIS — M25511 Pain in right shoulder: Secondary | ICD-10-CM | POA: Diagnosis not present

## 2023-04-08 DIAGNOSIS — M6281 Muscle weakness (generalized): Secondary | ICD-10-CM | POA: Insufficient documentation

## 2023-04-08 DIAGNOSIS — G8929 Other chronic pain: Secondary | ICD-10-CM | POA: Insufficient documentation

## 2023-04-19 ENCOUNTER — Other Ambulatory Visit: Payer: Self-pay

## 2023-04-26 ENCOUNTER — Other Ambulatory Visit: Payer: Self-pay

## 2023-05-06 ENCOUNTER — Ambulatory Visit
Admission: RE | Admit: 2023-05-06 | Discharge: 2023-05-06 | Disposition: A | Discharge status: Home / Self Care | Payer: 59 | Source: Ambulatory Visit | Attending: Nurse Practitioner | Admitting: Nurse Practitioner

## 2023-05-06 DIAGNOSIS — Z1231 Encounter for screening mammogram for malignant neoplasm of breast: Secondary | ICD-10-CM | POA: Diagnosis not present

## 2023-05-19 ENCOUNTER — Other Ambulatory Visit: Payer: Self-pay

## 2023-06-14 ENCOUNTER — Other Ambulatory Visit (HOSPITAL_COMMUNITY): Payer: Self-pay

## 2023-06-14 DIAGNOSIS — H401131 Primary open-angle glaucoma, bilateral, mild stage: Secondary | ICD-10-CM | POA: Diagnosis not present

## 2023-06-14 MED ORDER — DORZOLAMIDE HCL-TIMOLOL MAL 2-0.5 % OP SOLN
1.0000 [drp] | Freq: Two times a day (BID) | OPHTHALMIC | 4 refills | Status: AC
  Filled 2023-06-14: qty 10, 50d supply, fill #0

## 2023-06-14 MED ORDER — VYZULTA 0.024 % OP SOLN
1.0000 [drp] | Freq: Every evening | OPHTHALMIC | 4 refills | Status: AC
  Filled 2023-06-14 – 2023-06-15 (×2): qty 5, 50d supply, fill #0

## 2023-06-15 ENCOUNTER — Other Ambulatory Visit: Payer: Self-pay

## 2023-06-15 ENCOUNTER — Other Ambulatory Visit (HOSPITAL_COMMUNITY): Payer: Self-pay

## 2023-06-23 ENCOUNTER — Other Ambulatory Visit: Payer: Self-pay

## 2023-06-27 ENCOUNTER — Other Ambulatory Visit (HOSPITAL_COMMUNITY): Payer: Self-pay

## 2023-07-25 ENCOUNTER — Other Ambulatory Visit: Payer: Self-pay

## 2023-07-26 ENCOUNTER — Other Ambulatory Visit: Payer: Self-pay

## 2023-07-27 ENCOUNTER — Other Ambulatory Visit: Payer: Self-pay

## 2023-07-27 ENCOUNTER — Ambulatory Visit: Payer: Self-pay | Admitting: Nurse Practitioner

## 2023-07-29 ENCOUNTER — Encounter: Payer: Self-pay | Admitting: Nurse Practitioner

## 2023-07-29 ENCOUNTER — Other Ambulatory Visit: Payer: Self-pay

## 2023-07-29 ENCOUNTER — Ambulatory Visit (INDEPENDENT_AMBULATORY_CARE_PROVIDER_SITE_OTHER): Payer: 59 | Admitting: Nurse Practitioner

## 2023-07-29 VITALS — BP 128/84 | HR 84 | Temp 98.1°F | Resp 18 | Ht 61.0 in | Wt 202.0 lb

## 2023-07-29 DIAGNOSIS — Z23 Encounter for immunization: Secondary | ICD-10-CM | POA: Diagnosis not present

## 2023-07-29 DIAGNOSIS — I10 Essential (primary) hypertension: Secondary | ICD-10-CM

## 2023-07-29 DIAGNOSIS — Z760 Encounter for issue of repeat prescription: Secondary | ICD-10-CM

## 2023-07-29 DIAGNOSIS — M25531 Pain in right wrist: Secondary | ICD-10-CM

## 2023-07-29 DIAGNOSIS — G8929 Other chronic pain: Secondary | ICD-10-CM

## 2023-07-29 MED ORDER — HYDROXYZINE HCL 10 MG PO TABS
10.0000 mg | ORAL_TABLET | Freq: Every day | ORAL | 0 refills | Status: AC
  Filled 2023-07-29: qty 30, 30d supply, fill #0

## 2023-07-29 MED ORDER — DORZOLAMIDE HCL-TIMOLOL MAL 2-0.5 % OP SOLN
1.0000 [drp] | Freq: Two times a day (BID) | OPHTHALMIC | 1 refills | Status: DC
  Filled 2023-07-29: qty 10, 50d supply, fill #0
  Filled 2023-09-30: qty 10, 50d supply, fill #1

## 2023-07-29 MED ORDER — DICLOFENAC SODIUM 1 % EX GEL
4.0000 g | Freq: Four times a day (QID) | CUTANEOUS | 6 refills | Status: DC
Start: 2023-07-29 — End: 2024-09-26
  Filled 2023-07-29 – 2023-09-07 (×3): qty 100, 7d supply, fill #0

## 2023-07-29 MED ORDER — AMLODIPINE BESYLATE 5 MG PO TABS
5.0000 mg | ORAL_TABLET | Freq: Every day | ORAL | 3 refills | Status: DC
  Filled 2023-07-29 – 2023-08-17 (×2): qty 90, 90d supply, fill #0
  Filled 2023-11-17: qty 90, 90d supply, fill #1

## 2023-07-29 MED ORDER — LISINOPRIL-HYDROCHLOROTHIAZIDE 20-12.5 MG PO TABS
1.0000 | ORAL_TABLET | Freq: Every day | ORAL | 3 refills | Status: DC
  Filled 2023-07-29 – 2023-08-17 (×2): qty 90, 90d supply, fill #0
  Filled 2023-11-17: qty 90, 90d supply, fill #1
  Filled 2024-02-02: qty 90, 90d supply, fill #2
  Filled 2024-05-08: qty 90, 90d supply, fill #3

## 2023-07-29 MED ORDER — VYZULTA 0.024 % OP SOLN
1.0000 [drp] | Freq: Every evening | OPHTHALMIC | 4 refills | Status: AC
Start: 2023-07-29 — End: ?
  Filled 2023-07-29 – 2023-12-30 (×2): qty 5, 50d supply, fill #0

## 2023-07-29 NOTE — Patient Instructions (Addendum)
1. Need for influenza vaccination  - Flu vaccine trivalent PF, 6mos and older(Flulaval,Afluria,Fluarix,Fluzone)  2. Essential hypertension  - amLODipine (NORVASC) 5 MG tablet; Take 1 tablet (5 mg total) by mouth daily.  Dispense: 90 tablet; Refill: 3 - lisinopril-hydrochlorothiazide (ZESTORETIC) 20-12.5 MG tablet; Take 1 tablet by mouth daily.  Dispense: 90 tablet; Refill: 3  3. Medication refill  - amLODipine (NORVASC) 5 MG tablet; Take 1 tablet (5 mg total) by mouth daily.  Dispense: 90 tablet; Refill: 3 - lisinopril-hydrochlorothiazide (ZESTORETIC) 20-12.5 MG tablet; Take 1 tablet by mouth daily.  Dispense: 90 tablet; Refill: 3 - diclofenac Sodium (VOLTAREN) 1 % GEL; Apply 4 g topically 4 (four) times daily.  Dispense: 100 g; Refill: 6  4. Chronic pain of right wrist  - diclofenac Sodium (VOLTAREN) 1 % GEL; Apply 4 g topically 4 (four) times daily.  Dispense: 100 g; Refill: 6  Follow up:  Follow up in 6 months
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
## 2023-08-02 ENCOUNTER — Other Ambulatory Visit: Payer: Self-pay

## 2023-08-09 ENCOUNTER — Institutional Professional Consult (permissible substitution): Payer: Self-pay | Admitting: Clinical

## 2023-08-11 ENCOUNTER — Other Ambulatory Visit: Payer: Self-pay

## 2023-08-12 ENCOUNTER — Other Ambulatory Visit: Payer: Self-pay

## 2023-08-17 ENCOUNTER — Other Ambulatory Visit: Payer: Self-pay

## 2023-08-17 ENCOUNTER — Ambulatory Visit (INDEPENDENT_AMBULATORY_CARE_PROVIDER_SITE_OTHER): Payer: 59 | Admitting: Clinical

## 2023-08-17 ENCOUNTER — Other Ambulatory Visit: Payer: Self-pay | Admitting: Nurse Practitioner

## 2023-08-17 DIAGNOSIS — Z760 Encounter for issue of repeat prescription: Secondary | ICD-10-CM

## 2023-08-17 DIAGNOSIS — F4320 Adjustment disorder, unspecified: Secondary | ICD-10-CM | POA: Diagnosis not present

## 2023-08-17 DIAGNOSIS — K219 Gastro-esophageal reflux disease without esophagitis: Secondary | ICD-10-CM

## 2023-08-17 MED ORDER — OMEPRAZOLE 40 MG PO CPDR
40.0000 mg | DELAYED_RELEASE_CAPSULE | Freq: Every day | ORAL | 3 refills | Status: DC
  Filled 2023-08-17: qty 90, 90d supply, fill #0
  Filled 2023-11-17: qty 90, 90d supply, fill #1
  Filled 2024-02-02: qty 90, 90d supply, fill #2
  Filled 2024-05-08: qty 90, 90d supply, fill #3

## 2023-08-18 ENCOUNTER — Other Ambulatory Visit: Payer: Self-pay
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
## 2023-08-22 ENCOUNTER — Other Ambulatory Visit: Payer: Self-pay

## 2023-08-29 ENCOUNTER — Other Ambulatory Visit: Payer: Self-pay

## 2023-09-01 ENCOUNTER — Ambulatory Visit: Payer: 59 | Admitting: Clinical

## 2023-09-02 ENCOUNTER — Telehealth: Payer: Self-pay

## 2023-09-02 NOTE — Telephone Encounter (Signed)
I just called her and rescheduled, thank you

## 2023-09-02 NOTE — Telephone Encounter (Signed)
Pt called to cancel her appointment . She would like to reschedule. Please advise [facility]

## 2023-09-07 ENCOUNTER — Other Ambulatory Visit: Payer: Self-pay

## 2023-09-08 ENCOUNTER — Ambulatory Visit (INDEPENDENT_AMBULATORY_CARE_PROVIDER_SITE_OTHER): Payer: 59 | Admitting: Clinical

## 2023-09-08 DIAGNOSIS — F4320 Adjustment disorder, unspecified: Secondary | ICD-10-CM

## 2023-09-09 NOTE — BH Specialist Note (Signed)
Integrated Behavioral Health Follow Up In-Person Visit  MRN: [id number] Name: Deanna Floyd  Number of Integrated Behavioral Health Clinician visits: 2- Second Visit  Session Start time: 1315   Session End time: 1345  Total time in minutes: 30   Types of Service: Individual psychotherapy  Interpretor:No. Interpretor Name and Language: none  Subjective: Deanna Floyd is a 62 y.o. female  Patient was referred by Angus Seller, NP for anxiety, recent traumatic experience. Patient reports the following symptoms/concerns: anxiety Duration of problem: several weeks; Severity of problem: moderate  Objective: Mood: Euthymic and Affect: Appropriate Risk of harm to self or others: No plan to harm self or others  Patient and/or Family's Strengths/Protective Factors: Social connections, Social and Emotional competence, Concrete supports in place (healthy food, safe environments, etc.), and Sense of purpose   Goals Addressed: Patient will: Reduce symptoms of: anxiety and stress Increase knowledge and/or ability of: coping skills and self-management skills  Demonstrate ability to: Increase healthy adjustment to current life circumstances  Progress towards Goals: Ongoing  Interventions: Interventions utilized:  Supportive Counseling Standardized Assessments completed: Not Needed  Supportive counseling provided today. Patient reports she is doing pretty well, does not need much support today. She has continued to look for alternate housing and put in some applications. Provided supportive counseling around this. Discussed with patient that CSW will be leaving the practice. Patient is not sure if she wants to be connected with another counselor at this time but will consider it. Scheduled follow up appointment and will plan to discuss further and assess if patient needs or wants additional mental health support.   Patient and/or Family Response: Patient engaged in  session.  Assessment: Patient currently experiencing anxiety and adjustment challenges after a traumatic event recently. She then had to move out of her home.    Patient may benefit from supportive counseling to process her thoughts and emotions around the experience. She may also benefit from connection to community resources as she navigates finding a new place to live.   Plan: Follow up with behavioral health clinician on: 09/20/23  Abigail Butts, LCSW

## 2023-09-14 ENCOUNTER — Other Ambulatory Visit: Payer: Self-pay

## 2023-09-15 ENCOUNTER — Other Ambulatory Visit: Payer: Self-pay

## 2023-09-19 ENCOUNTER — Other Ambulatory Visit: Payer: Self-pay

## 2023-09-20 ENCOUNTER — Ambulatory Visit: Payer: 59 | Admitting: Clinical

## 2023-09-30 ENCOUNTER — Other Ambulatory Visit: Payer: Self-pay

## 2023-09-30 ENCOUNTER — Ambulatory Visit (INDEPENDENT_AMBULATORY_CARE_PROVIDER_SITE_OTHER): Payer: 59 | Admitting: Clinical

## 2023-09-30 DIAGNOSIS — F4320 Adjustment disorder, unspecified: Secondary | ICD-10-CM
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
## 2023-10-09 ENCOUNTER — Other Ambulatory Visit: Payer: Self-pay

## 2023-10-09 ENCOUNTER — Emergency Department (HOSPITAL_COMMUNITY)
Admission: EM | Admit: 2023-10-09 | Discharge: 2023-10-09 | Disposition: A | Discharge status: Home / Self Care | Payer: 59 | Attending: Emergency Medicine | Admitting: Emergency Medicine

## 2023-10-09 ENCOUNTER — Emergency Department (HOSPITAL_COMMUNITY): Payer: 59

## 2023-10-09 DIAGNOSIS — Z760 Encounter for issue of repeat prescription: Secondary | ICD-10-CM

## 2023-10-09 DIAGNOSIS — R109 Unspecified abdominal pain: Secondary | ICD-10-CM | POA: Diagnosis not present

## 2023-10-09 DIAGNOSIS — M545 Low back pain, unspecified: Secondary | ICD-10-CM | POA: Diagnosis not present

## 2023-10-09 DIAGNOSIS — Z79899 Other long term (current) drug therapy: Secondary | ICD-10-CM | POA: Diagnosis not present

## 2023-10-09 DIAGNOSIS — M25552 Pain in left hip: Secondary | ICD-10-CM

## 2023-10-09 DIAGNOSIS — N281 Cyst of kidney, acquired: Secondary | ICD-10-CM | POA: Diagnosis not present

## 2023-10-09 DIAGNOSIS — G8929 Other chronic pain: Secondary | ICD-10-CM

## 2023-10-09 LAB — CBC
HCT: 34.3 % — ABNORMAL LOW (ref 36.0–46.0)
Hemoglobin: 11.8 g/dL — ABNORMAL LOW (ref 12.0–15.0)
MCH: 33.2 pg (ref 26.0–34.0)
MCHC: 34.4 g/dL (ref 30.0–36.0)
MCV: 96.6 fL (ref 80.0–100.0)
Platelets: 291 10*3/uL (ref 150–400)
RBC: 3.55 MIL/uL — ABNORMAL LOW (ref 3.87–5.11)
RDW: 12.6 % (ref 11.5–15.5)
WBC: 8.5 10*3/uL (ref 4.0–10.5)
nRBC: 0 % (ref 0.0–0.2)

## 2023-10-09 LAB — BASIC METABOLIC PANEL
Anion gap: 11 (ref 5–15)
BUN: 11 mg/dL (ref 8–23)
CO2: 26 mmol/L (ref 22–32)
Calcium: 9.2 mg/dL (ref 8.9–10.3)
Chloride: 100 mmol/L (ref 98–111)
Creatinine, Ser: 0.6 mg/dL (ref 0.44–1.00)
GFR, Estimated: >60 mL/min (ref >60)
Glucose, Bld: 112 mg/dL — ABNORMAL HIGH (ref 70–99)
Potassium: 3.3 mmol/L — ABNORMAL LOW (ref 3.5–5.1)
Sodium: 137 mmol/L (ref 135–145)

## 2023-10-09 LAB — URINALYSIS, ROUTINE W REFLEX MICROSCOPIC
Bilirubin Urine: NEGATIVE
Glucose, UA: NEGATIVE mg/dL
Hgb urine dipstick: NEGATIVE
Ketones, ur: NEGATIVE mg/dL
Leukocytes,Ua: NEGATIVE
Nitrite: NEGATIVE
Protein, ur: NEGATIVE mg/dL
Specific Gravity, Urine: 1.014 (ref 1.005–1.030)
pH: 5 (ref 5.0–8.0)

## 2023-10-09 MED ORDER — MELOXICAM 15 MG PO TABS
15.0000 mg | ORAL_TABLET | Freq: Every day | ORAL | 0 refills | Status: DC
  Filled 2023-10-09: qty 30, 30d supply, fill #0

## 2023-10-09 MED ORDER — CYCLOBENZAPRINE HCL 10 MG PO TABS
10.0000 mg | ORAL_TABLET | Freq: Three times a day (TID) | ORAL | 6 refills | Status: DC | PRN
  Filled 2023-10-09: qty 15, 5d supply, fill #0

## 2023-10-09 MED ORDER — OXYCODONE-ACETAMINOPHEN 5-325 MG PO TABS
1.0000 | ORAL_TABLET | Freq: Four times a day (QID) | ORAL | 0 refills | Status: AC | PRN
  Filled 2023-10-09: qty 12, 3d supply, fill #0

## 2023-10-09 MED ORDER — OXYCODONE-ACETAMINOPHEN 5-325 MG PO TABS
1.0000 | ORAL_TABLET | Freq: Once | ORAL | Status: AC
  Administered 2023-10-09: 1 via ORAL
  Filled 2023-10-09: qty 1

## 2023-10-09 MED ORDER — OXYCODONE-ACETAMINOPHEN 5-325 MG PO TABS
1.0000 | ORAL_TABLET | ORAL | Status: DC | PRN
  Filled 2023-10-09: qty 1

## 2023-10-09 NOTE — ED Notes (Signed)
Pt teaching provided on medications that may cause drowsiness. Pt instructed not to drive or operate heavy machinery while taking the prescribed medication. Pt verbalized understanding.   Pt provided discharge instructions and prescription information. Pt was given the opportunity to ask questions and questions were answered.   

## 2023-10-09 NOTE — ED Triage Notes (Signed)
Patient reports left flank pain and pain with urination x 3 days. Denies fevers, nausea, and vomiting. Took an antiinflammatory without relief.

## 2023-10-09 NOTE — ED Provider Notes (Signed)
[place] [facility] DEPARTMENT AT [facility] Provider Note   CSN: [id number] Arrival date & time: 10/09/23  1005     History {Add pertinent medical, surgical, social history, OB history to HPI:1} Chief Complaint  Patient presents with   Flank Pain         Deanna Floyd is a 62 y.o. female.  She has a history of low back pain hypertension neuropathy.  She said starting Thursday evening she has been having severe left-sided low back pain radiating into her left lower abdomen and left hip.  She has had this before but never this severe.  She has tried some over-the-counter medications without any improvement.  No bowel or bladder incontinence no urinary symptoms.  No fever.  No weakness.  Rates the pain is 10 out of 10 worse with movement.  The history is provided by the patient.  Flank Pain This is a recurrent problem. The current episode started more than 2 days ago. The problem occurs constantly. The problem has not changed since onset.Associated symptoms include abdominal pain. Pertinent negatives include no chest pain, no headaches and no shortness of breath. The symptoms are aggravated by bending, twisting and walking. Nothing relieves the symptoms. Treatments tried: nsaids. The treatment provided no relief.       Home Medications Prior to Admission medications   Medication Sig Start Date End Date Taking? Authorizing Provider  amLODipine (NORVASC) 5 MG tablet Take 1 tablet (5 mg total) by mouth daily. 07/29/23   Ivonne Andrew, NP  cyclobenzaprine (FLEXERIL) 10 MG tablet Take 1 tablet (10 mg total) by mouth 3 (three) times daily as needed for muscle spasms. 08/19/21   Barbette Merino, NP  diclofenac Sodium (VOLTAREN) 1 % GEL Apply 4 g topically 4 (four) times daily. 07/29/23   Ivonne Andrew, NP  dorzolamide-timolol (COSOPT) 2-0.5 % ophthalmic solution Instill 1 drop into both eyes twice a day 06/14/23     dorzolamide-timolol (COSOPT) 2-0.5 % ophthalmic solution  Place 1 drop into both eyes 2 (two) times daily. 07/29/23   Ivonne Andrew, NP  gabapentin (NEURONTIN) 300 MG capsule Take 1 capsule (300 mg total) by mouth 2 (two) times daily. As needed. 08/19/21   Barbette Merino, NP  hydrOXYzine (ATARAX) 10 MG tablet Take 1 tablet (10 mg total) by mouth at bedtime. 07/29/23   Ivonne Andrew, NP  ibuprofen (ADVIL) 800 MG tablet Take 1 tablet (800 mg total) by mouth every 8 (eight) hours as needed. 01/24/23   Ivonne Andrew, NP  Latanoprostene Bunod (VYZULTA) 0.024 % SOLN Place 1 drop into both eyes every evening. 02/02/23     Latanoprostene Bunod (VYZULTA) 0.024 % SOLN Place 1 drop into both eyes every evening. 06/14/23     Latanoprostene Bunod (VYZULTA) 0.024 % SOLN Place 1 drop into both eyes every evening. 07/29/23   Ivonne Andrew, NP  lisinopril-hydrochlorothiazide (ZESTORETIC) 20-12.5 MG tablet Take 1 tablet by mouth daily. 07/29/23   Ivonne Andrew, NP  meloxicam (MOBIC) 15 MG tablet Take 1 tablet (15 mg total) by mouth daily. 09/17/22   Ivonne Andrew, NP  meloxicam (MOBIC) 7.5 MG tablet Take 1 tablet (7.5 mg total) by mouth 2 (two) times daily as needed for pain. 02/15/23   Tarry Kos, MD  Multiple Vitamin (MULTIVITAMIN WITH MINERALS) TABS tablet Take 1 tablet by mouth daily.    [provider]  mupirocin ointment (BACTROBAN) 2 % Apply 1 Application topically 2 (two) times daily.  Patient not taking: Reported on 01/24/2023 06/17/22   Ivonne Andrew, NP  omeprazole (PRILOSEC) 40 MG capsule Take 1 capsule (40 mg total) by mouth daily. 08/17/23 08/16/24  Ivonne Andrew, NP      Allergies    Hydrocortisone and Lyrica [pregabalin]    Review of Systems   Review of Systems  Constitutional:  Negative for fever.  Respiratory:  Negative for shortness of breath.   Cardiovascular:  Negative for chest pain.  Gastrointestinal:  Positive for abdominal pain. Negative for nausea and vomiting.  Genitourinary:  Positive for flank pain. Negative for  dysuria and hematuria.  Musculoskeletal:  Positive for back pain.  Neurological:  Positive for numbness. Negative for weakness and headaches.    Physical Exam Updated Vital Signs BP (!) 132/90 (BP Location: Right Arm)   Pulse 86   Temp (!) 97.5 F (36.4 C) (Oral)   Resp 16   Ht 5\' 2"  (1.575 m)   Wt 92.5 kg   SpO2 100%   BMI 37.31 kg/m  Physical Exam Vitals and nursing note reviewed.  Constitutional:      General: She is not in acute distress.    Appearance: Normal appearance. She is well-developed.  HENT:     Head: Normocephalic and atraumatic.  Eyes:     Conjunctiva/sclera: Conjunctivae normal.  Cardiovascular:     Rate and Rhythm: Normal rate and regular rhythm.     Heart sounds: No murmur heard. Pulmonary:     Effort: Pulmonary effort is normal. No respiratory distress.     Breath sounds: Normal breath sounds.  Abdominal:     Palpations: Abdomen is soft.     Tenderness: There is no abdominal tenderness. There is no guarding or rebound.  Musculoskeletal:        General: Tenderness present. No deformity.     Cervical back: Neck supple.     Comments: She is diffusely tender over left lumbar area paralumbar into left buttock.  Skin:    General: Skin is warm and dry.     Capillary Refill: Capillary refill takes less than 2 seconds.  Neurological:     General: No focal deficit present.     Mental Status: She is alert.     Sensory: No sensory deficit.     Motor: No weakness.     ED Results / Procedures / Treatments   Labs (all labs ordered are listed, but only abnormal results are displayed) Labs Reviewed  CBC - Abnormal; Notable for the following components:      Result Value   RBC 3.55 (*)    Hemoglobin 11.8 (*)    HCT 34.3 (*)    All other components within normal limits  BASIC METABOLIC PANEL - Abnormal; Notable for the following components:   Potassium 3.3 (*)    Glucose, Bld 112 (*)    All other components within normal limits  URINALYSIS, ROUTINE W  REFLEX MICROSCOPIC - Abnormal; Notable for the following components:   APPearance HAZY (*)    All other components within normal limits    EKG None  Radiology No results found.  Procedures Procedures  {Document cardiac monitor, telemetry assessment procedure when appropriate:1}  Medications Ordered in ED Medications  oxyCODONE-acetaminophen (PERCOCET/ROXICET) 5-325 MG per tablet 1 tablet (has no administration in time range)  oxyCODONE-acetaminophen (PERCOCET/ROXICET) 5-325 MG per tablet 1 tablet (has no administration in time range)    ED Course/ Medical Decision Making/ A&P   {   Click here for ABCD2,  HEART and other calculatorsREFRESH Note before signing :1}                              Medical Decision Making Amount and/or Complexity of Data Reviewed Labs: ordered. Radiology: ordered.  Risk Prescription drug management.   This patient complains of ***; this involves an extensive number of treatment Options and is a complaint that carries with it a high risk of complications and morbidity. The differential includes ***  I ordered, reviewed and interpreted labs, which included *** I ordered medication *** and reviewed PMP when indicated. I ordered imaging studies which included *** and I independently    visualized and interpreted imaging which showed *** Additional history obtained from *** Previous records obtained and reviewed *** I consulted *** and discussed lab and imaging findings and discussed disposition.  Cardiac monitoring reviewed, *** Social determinants considered, *** Critical Interventions: ***  After the interventions stated above, I reevaluated the patient and found *** Admission and further testing considered, ***   {Document critical care time when appropriate:1} {Document review of labs and clinical decision tools ie heart score, Chads2Vasc2 etc:1}  {Document your independent review of radiology images, and any outside  records:1} {Document your discussion with family members, caretakers, and with consultants:1} {Document social determinants of health affecting pt's care:1} {Document your decision making why or why not admission, treatments were needed:1} Final Clinical Impression(s) / ED Diagnoses Final diagnoses:  None    Rx / DC Orders ED Discharge Orders     None

## 2023-10-10 ENCOUNTER — Other Ambulatory Visit: Payer: Self-pay

## 2023-10-10 ENCOUNTER — Telehealth: Payer: Self-pay

## 2023-10-10 NOTE — Transitions of Care (Post Inpatient/ED Visit) (Signed)
10/10/2023  Name: Deanna Floyd MRN: [id number] DOB: 1961/02/28  Today's TOC FU Call Status: Today's TOC FU Call Status:: Successful TOC FU Call Completed TOC FU Call Complete Date: 10/10/23 Patient's Name and Date of Birth confirmed.  Transition Care Management Follow-up Telephone Call Date of Discharge: 10/09/23 Discharge Facility: [facility]) Type of Discharge: Emergency Department Reason for ED Visit: Other: (LBP) How have you been since you were released from the hospital?: Same  Items Reviewed: Did you receive and understand the discharge instructions provided?: No Medications obtained,verified, and reconciled?: Yes (Medications Reviewed) Any new allergies since your discharge?: No Dietary orders reviewed?: Yes Do you have support at home?: No  Medications Reviewed Today: Medications Reviewed Today     Reviewed by Karena Addison, LPN (Licensed Practical Nurse) on 10/10/23 at 1737  Med List Status: <None>   Medication Order Taking? Sig Documenting Provider Last Dose Status Informant  amLODipine (NORVASC) 5 MG tablet [id number]  Take 1 tablet (5 mg total) by mouth daily. Ivonne Andrew, NP  Active   cyclobenzaprine (FLEXERIL) 10 MG tablet [id number]  Take 1 tablet (10 mg total) by mouth 3 (three) times daily as needed for muscle spasms. Terrilee Files, MD  Active   diclofenac Sodium (VOLTAREN) 1 % GEL [id number]  Apply 4 g topically 4 (four) times daily. Ivonne Andrew, NP  Active   dorzolamide-timolol (COSOPT) 2-0.5 % ophthalmic solution [id number] No Instill 1 drop into both eyes twice a day  Taking Active   dorzolamide-timolol (COSOPT) 2-0.5 % ophthalmic solution [id number]  Place 1 drop into both eyes 2 (two) times daily. Ivonne Andrew, NP  Active   gabapentin (NEURONTIN) 300 MG capsule [id number] No Take 1 capsule (300 mg total) by mouth 2 (two) times daily. As needed. Barbette Merino, NP Taking Active   hydrOXYzine (ATARAX) 10 MG tablet [id number]  Take 1  tablet (10 mg total) by mouth at bedtime. Ivonne Andrew, NP  Active   ibuprofen (ADVIL) 800 MG tablet [id number] No Take 1 tablet (800 mg total) by mouth every 8 (eight) hours as needed. Ivonne Andrew, NP Taking Active   Latanoprostene Bunod (VYZULTA) 0.024 % SOLN [id number] No Place 1 drop into both eyes every evening.  Taking Active   Latanoprostene Bunod (VYZULTA) 0.024 % SOLN [id number] No Place 1 drop into both eyes every evening.  Taking Active   Latanoprostene Bunod (VYZULTA) 0.024 % SOLN [id number]  Place 1 drop into both eyes every evening. Ivonne Andrew, NP  Active   lisinopril-hydrochlorothiazide (ZESTORETIC) 20-12.5 MG tablet [id number]  Take 1 tablet by mouth daily. Ivonne Andrew, NP  Active   meloxicam (MOBIC) 15 MG tablet [id number]  Take 1 tablet (15 mg total) by mouth daily. Terrilee Files, MD  Active   meloxicam [facility]) 7.5 MG tablet [id number] No Take 1 tablet (7.5 mg total) by mouth 2 (two) times daily as needed for pain. Tarry Kos, MD Taking Active   Multiple Vitamin (MULTIVITAMIN WITH MINERALS) TABS tablet [id number] No Take 1 tablet by mouth daily. [provider] Taking Active Self  mupirocin ointment (BACTROBAN) 2 % [id number] No Apply 1 Application topically 2 (two) times daily.  Patient not taking: Reported on 01/24/2023   Ivonne Andrew, NP Not Taking Active   omeprazole (PRILOSEC) 40 MG capsule [id number]  Take 1 capsule (40 mg total) by mouth daily. Ivonne Andrew, NP  Active   oxyCODONE-acetaminophen (PERCOCET/ROXICET) 5-325 MG tablet [id number]  Take 1 tablet  by mouth every 6 (six) hours as needed for severe pain (pain score 7-10). Terrilee Files, MD  Active             Home Care and Equipment/Supplies: Were Home Health Services Ordered?: NA Any new equipment or medical supplies ordered?: NA  Functional Questionnaire: Do you need assistance with bathing/showering or dressing?: No Do you need assistance with meal preparation?: No Do  you need assistance with eating?: No Do you have difficulty maintaining continence: No Do you need assistance with getting out of bed/getting out of a chair/moving?: No Do you have difficulty managing or taking your medications?: No  Follow up appointments reviewed: PCP Follow-up appointment confirmed?: No (no avail appt. sent message to staff to schedule) MD Provider Line Number:[phone] Given: No Specialist Hospital Follow-up appointment confirmed?: NA Do you need transportation to your follow-up appointment?: No Do you understand care options if your condition(s) worsen?: Yes-patient verbalized understanding    SIGNATURE Karena Addison, LPN [facility] Nurse Health Advisor Direct Dial [phone]

## 2023-10-20 ENCOUNTER — Ambulatory Visit (INDEPENDENT_AMBULATORY_CARE_PROVIDER_SITE_OTHER): Payer: 59 | Admitting: Nurse Practitioner

## 2023-10-20 ENCOUNTER — Other Ambulatory Visit: Payer: Self-pay

## 2023-10-20 ENCOUNTER — Encounter: Payer: Self-pay | Admitting: Nurse Practitioner

## 2023-10-20 VITALS — BP 143/92 | HR 82 | Temp 98.6°F | Wt 205.8 lb

## 2023-10-20 DIAGNOSIS — Z760 Encounter for issue of repeat prescription: Secondary | ICD-10-CM

## 2023-10-20 DIAGNOSIS — M5442 Lumbago with sciatica, left side: Secondary | ICD-10-CM

## 2023-10-20 DIAGNOSIS — M25552 Pain in left hip: Secondary | ICD-10-CM

## 2023-10-20 DIAGNOSIS — M51362 Other intervertebral disc degeneration, lumbar region with discogenic back pain and lower extremity pain: Secondary | ICD-10-CM

## 2023-10-20 DIAGNOSIS — R932 Abnormal findings on diagnostic imaging of liver and biliary tract: Secondary | ICD-10-CM

## 2023-10-20 DIAGNOSIS — G8929 Other chronic pain: Secondary | ICD-10-CM | POA: Diagnosis not present

## 2023-10-20 DIAGNOSIS — M5441 Lumbago with sciatica, right side: Secondary | ICD-10-CM | POA: Diagnosis not present

## 2023-10-20 MED ORDER — MELOXICAM 15 MG PO TABS
15.0000 mg | ORAL_TABLET | Freq: Every day | ORAL | 0 refills | Status: DC
  Filled 2023-10-20: qty 30, 30d supply, fill #0

## 2023-10-20 MED ORDER — CYCLOBENZAPRINE HCL 10 MG PO TABS
10.0000 mg | ORAL_TABLET | Freq: Three times a day (TID) | ORAL | 6 refills | Status: DC | PRN
  Filled 2023-10-20: qty 15, 5d supply, fill #0
  Filled 2023-11-04: qty 15, 5d supply, fill #1
  Filled 2023-11-17: qty 15, 5d supply, fill #2
  Filled 2023-12-07: qty 15, 5d supply, fill #3
  Filled 2023-12-30: qty 15, 5d supply, fill #4

## 2023-10-20 NOTE — Progress Notes (Signed)
Subjective   Patient ID: Deanna Floyd, female    DOB: January 19, 1961, 62 y.o.   MRN: [id number]  Chief Complaint  Patient presents with   Follow-up    Hospital follow up    Referring provider: Ivonne Andrew, NP  Deanna Floyd is a 62 y.o. female with Past Medical History: No date: GERD (gastroesophageal reflux disease) No date: Hypertension 2007: Stroke [facility])   HPI  Patient presents today for an ED follow-up.  She states that on Thanksgiving day her back locked up with her and she had to go to the emergency room.  She states that since that time she has been much improved.  She has been taking her muscle relaxer and Mobic that was prescribed in the emergency room.  CT scan in ED did show degenerative disc disease to lumbar spine.  We will place a referral to Ortho. Denies f/c/s, n/v/d, hemoptysis, PND, leg swelling Denies chest pain or edema    Allergies  Allergen Reactions   Hydrocortisone Hives   Lyrica [Pregabalin] Rash    Immunization History  Administered Date(s) Administered   Influenza, Seasonal, Injecte, Preservative Fre 07/29/2023   Influenza,inj,Quad PF,6+ Mos 10/06/2015, 08/06/2016, 07/04/2017, 08/01/2018, 08/21/2019, 12/21/2021   PFIZER(Purple Top)SARS-COV-2 Vaccination 04/12/2020, 05/03/2020, 04/08/2021    Tobacco History: Social History   Tobacco Use  Smoking Status Former   Current packs/day: 0.00   Average packs/day: 0.3 packs/day for 10.0 years (2.5 ttl pk-yrs)   Types: Cigarettes   Start date: 2002   Quit date: 2012   Years since quitting: 12.9  Smokeless Tobacco Never   Counseling given: Not Answered   Outpatient Encounter Medications as of 10/20/2023  Medication Sig   amLODipine (NORVASC) 5 MG tablet Take 1 tablet (5 mg total) by mouth daily.   diclofenac Sodium (VOLTAREN) 1 % GEL Apply 4 g topically 4 (four) times daily.   dorzolamide-timolol (COSOPT) 2-0.5 % ophthalmic solution Instill 1 drop into both eyes twice a day    dorzolamide-timolol (COSOPT) 2-0.5 % ophthalmic solution Place 1 drop into both eyes 2 (two) times daily.   gabapentin (NEURONTIN) 300 MG capsule Take 1 capsule (300 mg total) by mouth 2 (two) times daily. As needed.   ibuprofen (ADVIL) 800 MG tablet Take 1 tablet (800 mg total) by mouth every 8 (eight) hours as needed.   Latanoprostene Bunod (VYZULTA) 0.024 % SOLN Place 1 drop into both eyes every evening.   Latanoprostene Bunod (VYZULTA) 0.024 % SOLN Place 1 drop into both eyes every evening.   Latanoprostene Bunod (VYZULTA) 0.024 % SOLN Place 1 drop into both eyes every evening.   lisinopril-hydrochlorothiazide (ZESTORETIC) 20-12.5 MG tablet Take 1 tablet by mouth daily.   meloxicam (MOBIC) 7.5 MG tablet Take 1 tablet (7.5 mg total) by mouth 2 (two) times daily as needed for pain.   Multiple Vitamin (MULTIVITAMIN WITH MINERALS) TABS tablet Take 1 tablet by mouth daily.   mupirocin ointment (BACTROBAN) 2 % Apply 1 Application topically 2 (two) times daily.   omeprazole (PRILOSEC) 40 MG capsule Take 1 capsule (40 mg total) by mouth daily.   oxyCODONE-acetaminophen (PERCOCET/ROXICET) 5-325 MG tablet Take 1 tablet by mouth every 6 (six) hours as needed for severe pain (pain score 7-10).   [DISCONTINUED] cyclobenzaprine (FLEXERIL) 10 MG tablet Take 1 tablet (10 mg total) by mouth 3 (three) times daily as needed for muscle spasms.   [DISCONTINUED] meloxicam (MOBIC) 15 MG tablet Take 1 tablet (15 mg total) by mouth daily.   cyclobenzaprine (FLEXERIL) 10  MG tablet Take 1 tablet (10 mg total) by mouth 3 (three) times daily as needed for muscle spasms.   hydrOXYzine (ATARAX) 10 MG tablet Take 1 tablet (10 mg total) by mouth at bedtime. (Patient not taking: Reported on 10/20/2023)   meloxicam (MOBIC) 15 MG tablet Take 1 tablet (15 mg total) by mouth daily.   No facility-administered encounter medications on file as of 10/20/2023.    Review of Systems  Review of Systems  Constitutional: Negative.    HENT: Negative.    Cardiovascular: Negative.   Gastrointestinal: Negative.   Allergic/Immunologic: Negative.   Neurological: Negative.   Psychiatric/Behavioral: Negative.       Objective:   BP (!) 143/92   Pulse 82   Temp 98.6 F (37 C)   Wt 205 lb 12.8 oz (93.4 kg)   SpO2 96%   BMI 37.64 kg/m   Wt Readings from Last 5 Encounters:  10/20/23 205 lb 12.8 oz (93.4 kg)  10/09/23 204 lb (92.5 kg)  07/29/23 202 lb (91.6 kg)  03/12/23 210 lb (95.3 kg)  01/24/23 216 lb 6.4 oz (98.2 kg)     Physical Exam Vitals and nursing note reviewed.  Constitutional:      General: She is not in acute distress.    Appearance: She is well-developed.  Cardiovascular:     Rate and Rhythm: Normal rate and regular rhythm.  Pulmonary:     Effort: Pulmonary effort is normal.     Breath sounds: Normal breath sounds.  Skin:    Findings: Rash present.  Neurological:     Mental Status: She is alert and oriented to person, place, and time.       Assessment & Plan:   Abnormal CT of liver -     CBC -     Comprehensive metabolic panel  Degeneration of intervertebral disc of lumbar region with discogenic back pain and lower extremity pain -     Ambulatory referral to Orthopedics  Left hip pain -     Meloxicam; Take 1 tablet (15 mg total) by mouth daily.  Dispense: 30 tablet; Refill: 0  Medication refill -     Cyclobenzaprine HCl; Take 1 tablet (10 mg total) by mouth 3 (three) times daily as needed for muscle spasms.  Dispense: 15 tablet; Refill: 6  Chronic bilateral low back pain with bilateral sciatica -     Cyclobenzaprine HCl; Take 1 tablet (10 mg total) by mouth 3 (three) times daily as needed for muscle spasms.  Dispense: 15 tablet; Refill: 6     Return in about 3 months (around 01/18/2024).   Deanna Andrew, NP 10/20/2023

## 2023-10-20 NOTE — Patient Instructions (Addendum)
1. Abnormal CT of liver (Primary)  - CBC - Comprehensive metabolic panel   2. Degeneration of intervertebral disc of lumbar region with discogenic back pain and lower extremity pain  - AMB referral to orthopedics    Follow up:  Follow up in 3 months

## 2023-10-21 ENCOUNTER — Other Ambulatory Visit: Payer: Self-pay

## 2023-10-21 LAB — CBC
Hematocrit: 36.2 % (ref 34.0–46.6)
Hemoglobin: 11.6 g/dL (ref 11.1–15.9)
MCH: 31.5 pg (ref 26.6–33.0)
MCHC: 32 g/dL (ref 31.5–35.7)
MCV: 98 fL — ABNORMAL HIGH (ref 79–97)
Platelets: 359 10*3/uL (ref 150–450)
RBC: 3.68 x10E6/uL — ABNORMAL LOW (ref 3.77–5.28)
RDW: 12 % (ref 11.7–15.4)
WBC: 5 10*3/uL (ref 3.4–10.8)

## 2023-10-21 LAB — COMPREHENSIVE METABOLIC PANEL
ALT: 15 [IU]/L (ref 0–32)
AST: 15 [IU]/L (ref 0–40)
Albumin: 4.2 g/dL (ref 3.9–4.9)
Alkaline Phosphatase: 75 [IU]/L (ref 44–121)
BUN/Creatinine Ratio: 13 (ref 12–28)
BUN: 9 mg/dL (ref 8–27)
Bilirubin Total: 0.3 mg/dL (ref 0.0–1.2)
CO2: 24 mmol/L (ref 20–29)
Calcium: 9 mg/dL (ref 8.7–10.3)
Chloride: 101 mmol/L (ref 96–106)
Creatinine, Ser: 0.67 mg/dL (ref 0.57–1.00)
Globulin, Total: 2.5 g/dL (ref 1.5–4.5)
Glucose: 111 mg/dL — ABNORMAL HIGH (ref 70–99)
Potassium: 3.9 mmol/L (ref 3.5–5.2)
Sodium: 141 mmol/L (ref 134–144)
Total Protein: 6.7 g/dL (ref 6.0–8.5)
eGFR: 98 mL/min/{1.73_m2} (ref >59)

## 2023-10-21 NOTE — Progress Notes (Signed)
 My chart message was sent to pt after confirming last activity date.  KH

## 2023-11-04 ENCOUNTER — Other Ambulatory Visit: Payer: Self-pay

## 2023-11-07 ENCOUNTER — Other Ambulatory Visit: Payer: Self-pay

## 2023-11-07 ENCOUNTER — Other Ambulatory Visit: Payer: Self-pay | Admitting: Nurse Practitioner

## 2023-11-07 DIAGNOSIS — M25562 Pain in left knee: Secondary | ICD-10-CM

## 2023-11-10 ENCOUNTER — Other Ambulatory Visit: Payer: Self-pay

## 2023-11-10 MED ORDER — IBUPROFEN 800 MG PO TABS
800.0000 mg | ORAL_TABLET | Freq: Three times a day (TID) | ORAL | 6 refills | Status: DC | PRN
  Filled 2023-11-10: qty 30, 10d supply, fill #0
  Filled 2023-12-07: qty 30, 10d supply, fill #1
  Filled 2023-12-30: qty 30, 10d supply, fill #2

## 2023-11-11 ENCOUNTER — Other Ambulatory Visit (HOSPITAL_COMMUNITY): Payer: Self-pay

## 2023-11-11 DIAGNOSIS — R7309 Other abnormal glucose: Secondary | ICD-10-CM | POA: Diagnosis not present

## 2023-11-11 DIAGNOSIS — H524 Presbyopia: Secondary | ICD-10-CM | POA: Diagnosis not present

## 2023-11-11 DIAGNOSIS — H25813 Combined forms of age-related cataract, bilateral: Secondary | ICD-10-CM | POA: Diagnosis not present

## 2023-11-11 DIAGNOSIS — H401131 Primary open-angle glaucoma, bilateral, mild stage: Secondary | ICD-10-CM | POA: Diagnosis not present

## 2023-11-11 DIAGNOSIS — H40033 Anatomical narrow angle, bilateral: Secondary | ICD-10-CM | POA: Diagnosis not present

## 2023-11-11 MED ORDER — DORZOLAMIDE HCL-TIMOLOL MAL 2-0.5 % OP SOLN
1.0000 [drp] | Freq: Two times a day (BID) | OPHTHALMIC | 4 refills | Status: AC
  Filled 2023-11-11: qty 10, 50d supply, fill #0
  Filled 2024-06-12: qty 10, 50d supply, fill #1
  Filled 2024-07-23: qty 10, 50d supply, fill #2

## 2023-11-11 MED ORDER — VYZULTA 0.024 % OP SOLN
1.0000 [drp] | Freq: Every evening | OPHTHALMIC | 4 refills | Status: AC
  Filled 2023-11-11: qty 5, 50d supply, fill #0
  Filled 2024-04-25: qty 5, 50d supply, fill #1
  Filled 2024-06-12 (×2): qty 5, 50d supply, fill #2
  Filled 2024-07-23: qty 5, 50d supply, fill #3

## 2023-11-14 ENCOUNTER — Other Ambulatory Visit: Payer: Self-pay

## 2023-11-17 ENCOUNTER — Other Ambulatory Visit: Payer: Self-pay

## 2023-11-21 ENCOUNTER — Other Ambulatory Visit: Payer: Self-pay

## 2023-11-21 ENCOUNTER — Other Ambulatory Visit (INDEPENDENT_AMBULATORY_CARE_PROVIDER_SITE_OTHER): Payer: 59

## 2023-11-21 ENCOUNTER — Ambulatory Visit (INDEPENDENT_AMBULATORY_CARE_PROVIDER_SITE_OTHER): Payer: 59 | Admitting: Orthopedic Surgery

## 2023-11-21 VITALS — BP 122/84 | HR 92 | Ht 62.0 in | Wt 206.0 lb

## 2023-11-21 DIAGNOSIS — M545 Low back pain, unspecified: Secondary | ICD-10-CM | POA: Diagnosis not present

## 2023-11-21 MED ORDER — GABAPENTIN 300 MG PO CAPS
300.0000 mg | ORAL_CAPSULE | Freq: Three times a day (TID) | ORAL | 1 refills | Status: AC
  Filled 2023-11-21: qty 90, 30d supply, fill #0

## 2023-11-21 MED ORDER — METHYLPREDNISOLONE 4 MG PO TBPK
ORAL_TABLET | ORAL | 0 refills | Status: AC
  Filled 2023-11-21: qty 21, 6d supply, fill #0

## 2023-11-21 NOTE — Progress Notes (Signed)
 Orthopedic Spine Surgery Office Note  Assessment: Patient is a 63 y.o. female with low back pain that radiates into bilateral buttocks. On the left, it radiates along the posterior aspect of the thigh as well. Suspect radiculopathy    Plan: -Explained that initially conservative treatment is tried as a significant number of patients may experience relief with these treatment modalities. Discussed that the conservative treatments include:  -activity modification  -physical therapy  -over the counter pain medications  -medrol  dosepak  -lumbar steroid injections -Patient has tried meloxicam , ibuprofen , flexeril , percocet -Prescribed gabapentin  and a medrol  dose pak to try for pain relief -If she is not doing any better at our next visit, will order an MRI to evaluate further -Encouraged her to work on weight loss as this may help with her pain as well. Would want her to get down to a BMI of 35 prior to any elective spine surgery.  -Patient should return to office in 6 weeks, x-rays at next visit: none   Patient expressed understanding of the plan and all questions were answered to the patient's satisfaction.   ___________________________________________________________________________   History:  Patient is a 63 y.o. female who presents today for lumbar spine.  Patient has a history of chronic low back pain.  She said she has had back pain for about 17 years.  Pain has gotten significantly worse within the last 2 months.  There is no trauma or injury that preceded the onset of this worsening pain.  She feels pain in her bilateral buttock.  On the left, she feels a going down the posterior aspect of the thigh to the level of the knee.  Sometimes, it radiates past the knee along the posterior aspect of the leg on the left side.  She does not have any pain rating past the buttock on the right side.  She has pain with activity and at rest.  She found Percocet helpful but has not found any other  medications helpful.   Weakness: Denies Symptoms of imbalance: Denies Paresthesias and numbness: Denies Bowel or bladder incontinence: Denies Saddle anesthesia: Denies  Treatments tried: meloxicam , ibuprofen , flexeril , percocet  Review of systems: Denies fevers and chills, night sweats, unexplained weight loss, history of cancer. Has had pain that wakes her at night  Past medical history: GERD HTN History of stroke  Allergies: hydrocortisone, lyrica   Past surgical history:  Left foot surgery Trigger finger release  Social history: Denies use of nicotine product (smoking, vaping, patches, smokeless) Alcohol use: Yes, approximately 2 drinks per week Denies recreational drug use   Physical Exam:  BMI of 37.7  General: no acute distress, appears stated age Neurologic: alert, answering questions appropriately, following commands Respiratory: unlabored breathing on room air, symmetric chest rise Psychiatric: appropriate affect, normal cadence to speech   MSK (spine):  -Strength exam      Left  Right EHL    -/5  5/5 TA    5/5  5/5 GSC    5/5  5/5 Knee extension  5/5  5/5 Hip flexion   5/5  5/5  Patient with prior surgery on her left foot, states she has been unable to extend her toes since then  -Sensory exam    Sensation intact to light touch in L3-S1 nerve distributions of bilateral lower extremities  -Achilles DTR: 2/4 on the left, 2/4 on the right -Patellar tendon DTR: 2/4 on the left, 2/4 on the right  -Straight leg raise: negative bilaterally -Femoral nerve stretch test: negative bilaterally -  Clonus: no beats bilaterally  -Left hip exam: no pain through range of motion, negative stinchfield, negative faber -Right hip exam: no pain through range of motion, negative stinchfield, negative faber  Imaging: XRs of the lumbar spine from 11/21/2023 were independently reviewed and interpreted, showing L4/5 spondylolisthesis that shifts about 2mm between  flexion and extension views. Disc height loss at L4/5 and L5/S1. No other significant degenerative changes seen. No fracture or dislocation seen.    Patient name: Deanna Floyd Patient MRN: [id number] Date of visit: 11/21/23

## 2023-12-06 ENCOUNTER — Other Ambulatory Visit: Payer: Self-pay

## 2023-12-06 ENCOUNTER — Ambulatory Visit: Payer: 59 | Attending: Orthopedic Surgery | Admitting: Physical Therapy

## 2023-12-06 ENCOUNTER — Encounter: Payer: Self-pay | Admitting: Physical Therapy

## 2023-12-06 DIAGNOSIS — M5459 Other low back pain: Secondary | ICD-10-CM | POA: Diagnosis not present

## 2023-12-06 DIAGNOSIS — M545 Low back pain, unspecified: Secondary | ICD-10-CM | POA: Insufficient documentation

## 2023-12-06 NOTE — Therapy (Signed)
OUTPATIENT PHYSICAL THERAPY LUMBAR EVALUATION   Patient Name: Deanna Floyd MRN: [id number] DOB:29-Mar-1961, 63 y.o., female Today's Date: 12/06/2023  END OF SESSION:  PT End of Session - 12/06/23 1108     Visit Number 1    Number of Visits 4    Date for PT Re-Evaluation 12/27/23    PT Start Time 1115    PT Stop Time 1200    PT Time Calculation (min) 45 min    Activity Tolerance Patient tolerated treatment well    Behavior During Therapy [facility] for tasks assessed/performed             Past Medical History:  Diagnosis Date   GERD (gastroesophageal reflux disease)    Hypertension    Stroke (HCC) 2007   Past Surgical History:  Procedure Laterality Date   FOOT SURGERY  2015   left side had 2 surgeries in 2015   TRIGGER FINGER RELEASE  2010   Patient Active Problem List   Diagnosis Date Noted   Acute pain of left knee 01/24/2023   Left hip pain 01/24/2023   Axillary lump, left 09/17/2022   Solitary pulmonary nodule 06/17/2022   Generalized abdominal pain 11/12/2021   Loss of weight 11/12/2021   Right wrist pain 03/27/2020   Neuropathy 08/21/2019   Chronic foot pain, left 08/21/2019   Seasonal allergies 08/21/2019   Right upper quadrant pain 05/08/2019   Gastroesophageal reflux disease with esophagitis 05/08/2019   Hot flashes due to menopause 05/08/2019   Carpal tunnel syndrome on both sides 11/19/2016   Metabolic syndrome 11/19/2016   Essential hypertension 11/19/2016   Allergic dermatitis 11/19/2016   Allergy status to unspecified drugs, medicaments and biological substances status 11/19/2016   BP check 09/22/2016   Post-operative state 06/02/2016   Obesity 01/08/2016   Low back pain 10/06/2015   Pain in joint, ankle and foot 10/06/2015    PCP: Ardyth Gal, NP  REFERRING PROVIDER: London Sheer, MD  REFERRING DIAG: Low back pain, unspecified back pain laterality, unspecified chronicity, unspecified whether sciatica present [M54.50]   Rationale  for Evaluation and Treatment: Rehabilitation  THERAPY DIAG:  Other low back pain  ONSET DATE: 5 days  SUBJECTIVE:                                                                                                                                                                                           SUBJECTIVE STATEMENT: Low back pain that travels down to the back of the left knee, hurts the most early in the morning and at night. Almost feels like a charlie horse that wont go away. Hip still  sore on the L side. Took ibuprofen before session.standing for longer than 15 minutes, such as while cooking, has to go sit down  PERTINENT HISTORY:  See problem list, no pertinent hx reported.  PAIN:  Are you having pain? Yes: NPRS scale: 3/10 Pain location: low back, behind L knee Pain description: like a charlie horse, pulling Aggravating factors: prolonged standing  Relieving factors: medication, hot bath  PRECAUTIONS: None  RED FLAGS: None   WEIGHT BEARING RESTRICTIONS: No  FALLS:  Has patient fallen in last 6 months? No  LIVING ENVIRONMENT: Lives with: lives with their family Lives in: House/apartment Stairs: No Has following equipment at home: None  OCCUPATION: retired  PLOF: Independent  PATIENT GOALS: just get around better, stand longer, get rid of the pain  NEXT MD VISIT: 6 weeks from referring encounter ~Feb.  OBJECTIVE:  Note: Objective measures were completed at Evaluation unless otherwise noted.  PATIENT SURVEYS:  Modified Oswestry 23/50   COGNITION: Overall cognitive status: Within functional limits for tasks assessed     SENSATION: WFL  MUSCLE LENGTH: Hamstrings: Right -10 deg; Left -10 deg Thomas test:  POSTURE: rounded shoulders and forward head  PALPATION: Significant tenderness to piriformis muscle belly, and posterior knee   Lumbar contraction pattern  L Multifidus: WFL  R Multifidus:WFL  LUMBAR ROM:   AROM eval  Flexion   Extension    Right lateral flexion   Left lateral flexion   Right rotation   Left rotation    (Blank rows = not tested)  ! Indicates pain with testing  LOWER EXTREMITY ROM:     Passive  Right eval Left eval  Hip flexion [facility]  Hip extension [facility]! WFL!  Hip abduction [facility]! WFL!  Hip adduction    Hip internal rotation [facility]! WFL!  Hip external rotation [facility]! WFL!  Knee flexion [facility]  Knee extension [facility]  Ankle dorsiflexion    Ankle plantarflexion    Ankle inversion    Ankle eversion     (Blank rows = not tested)  ! Indicates pain with testing  LOWER EXTREMITY MMT:    MMT Right eval Left eval  Hip flexion 4 4  Hip extension 4 4  Hip abduction  4  Hip adduction    Hip internal rotation    Hip external rotation    Knee flexion    Knee extension    Ankle dorsiflexion    Ankle plantarflexion    Ankle inversion    Ankle eversion     (Blank rows = not tested)   ! Indicates pain with testing LUMBAR SPECIAL TESTS:  Straight leg raise test: Positive  GAIT: Distance walked: 24ft Assistive device utilized: None Level of assistance: Complete Independence Comments:   [facility] Adult PT Treatment:                                                DATE: 12/06/2023 Manual Therapy: Piriformis release Therapeutic Activity: HEP review POC discussion  PATIENT EDUCATION:  Education details: Pt received education regarding HEP performance, ADL performance, functional activity tolerance, impairment education, appropriate performance of therapeutic activities.  Person educated: Patient Education method: Explanation, Demonstration, Tactile cues, Verbal cues, and Handouts Education comprehension: verbalized understanding and returned demonstration  HOME EXERCISE PROGRAM: Access Code: [email or web address] URL: [email or web address] Date: 12/06/2023 Prepared by: Sheliah Plane  Exercises - Supine Piriformis Stretch with Foot on Ground  - 1 x daily - 7 x weekly - 2 sets - 1 reps - 1' hold - Piriformis Mobilization with Small Ball  - 1 x daily - 4-7 x weekly - 1-2 sets - 1 reps - 5' hold - Supine Bridge  - 1 x daily - 4-5 x weekly - 3 sets - 10 reps - 4s hold - Clamshell with Resistance  - 1 x daily - 4-5 x weekly - 3 sets - 10 reps - 4s hold  ASSESSMENT:  CLINICAL IMPRESSION: Eval impression (12/06/2023): Pt. attended today's physical therapy session for evaluation of low back pain with radiating pain to L knee. Pt has complaints of a charlie horse feeling behind the L knee, pain in the back and highly sensitve hip girdle. Pt has notable deficits with hip strength, sciatic nerve motility, Hip mobility and piriformis tenderness.  Signs and symptoms are concurrent with piriformis syndrome. Pt would benefit from therapeutic focus on piriformis motility, Hip girdle strengthening in pain free ROM, standing tolerance, and core activation patterns for stabilizing lumbar spine.  Pt demonstrated great understanding of education provided and required minimal cues for appropriate performance with today's activities.  Pt requires the intervention of skilled outpatient physical therapy to address the aforementioned deficits and progress towards a functional level in line with therapeutic goals.   OBJECTIVE IMPAIRMENTS: Abnormal gait, decreased activity tolerance, decreased mobility, difficulty walking, decreased strength, impaired flexibility, impaired tone, and pain .   ACTIVITY LIMITATIONS: carrying, lifting, sitting, standing, sleeping, toileting, and caring for others  PARTICIPATION LIMITATIONS: meal prep, cleaning, driving, shopping, community activity, and yard work  PERSONAL FACTORS: Age and Fitness are also affecting patient's functional outcome.   REHAB POTENTIAL: Good  CLINICAL DECISION MAKING: Stable/uncomplicated  EVALUATION COMPLEXITY: Low   GOALS: Goals  reviewed with patient? YES  SHORT TERM GOALS: Target date: 12/20/2023  Pt will be independent with administered HEP to demonstrate the competency necessary for long term managemnet of symptoms at home. Baseline: Goal status: INITIAL    LONG TERM GOALS: Target date: 12/27/2023  Pt. Will achieve a ODI score of 11/50 as to demonstrate improvement in self-perceived functional ability with daily activities.  Baseline: 23/50 Goal status: INITIAL  2.  Pt will improve MMT score for hip extension to a 5/5 to demonstrate improvement in strength for quality of motion and activity performance.  Baseline:  Goal status: INITIAL  PLAN:  PT FREQUENCY: 1x/week  PT DURATION: 3 weeks  PLANNED INTERVENTIONS: 97110-Therapeutic exercises, 97530- Therapeutic activity, [id number]- Neuromuscular re-education, 97535- Self Care, 16109- Manual therapy, [id number]- Gait training, Balance training, Stair training, Dry Needling, Joint mobilization, and Moist heat.  PLAN FOR NEXT SESSION: review HEP, progress with POC as laid out in assessment   Sheliah Plane, PT, DPT 12/06/2023, 12:08 PM     Date of referral: 11/21/2023 Referring provider: London Sheer, MD Referring diagnosis? See above Treatment diagnosis? (if different than referring diagnosis) See above  What was this (referring dx) caused by? Unspecified  Nature of Condition: Initial Onset (within last 3 months)   Laterality: Lt  Current Functional Measure Score:  Back Index 23/50  Objective measurements identify impairments when they are compared to normal values, the uninvolved extremity, and prior level of function.  [x]  Yes  []  No  Objective assessment of functional ability: Moderate functional limitations   Briefly describe symptoms: see assessment  How did symptoms start: see assessment  Average pain intensity:  Last 24 hours: 3/10  Past week: 3/10  How often does the pt experience symptoms? Frequently  How much have the symptoms  interfered with usual daily activities? Moderately  How has condition changed since care began at this facility? NA - initial visit  In general, how is the patients overall health? Good   BACK PAIN (STarT Back Screening Tool) Has pain spread down the leg(s) at some time in the last 2 weeks? Y Has there been pain in the shoulder or neck at some time in the last 2 weeks? n Has the pt only walked short distances because of back pain? y Has patient dressed more slowly because of back pain in the past 2 weeks? y Does patient think it's not safe for a person with this condition to be physically active? n Does patient have worrying thoughts a lot of the time? y Does patient feel back pain is terrible and will never get any better? n Has patient stopped enjoying things they usually enjoy? y

## 2023-12-08 ENCOUNTER — Other Ambulatory Visit: Payer: Self-pay

## 2023-12-12 ENCOUNTER — Encounter: Payer: Self-pay | Admitting: Physical Therapy

## 2023-12-12 ENCOUNTER — Ambulatory Visit: Payer: 59 | Attending: Orthopedic Surgery | Admitting: Physical Therapy

## 2023-12-12 DIAGNOSIS — R2689 Other abnormalities of gait and mobility: Secondary | ICD-10-CM

## 2023-12-12 DIAGNOSIS — M5459 Other low back pain: Secondary | ICD-10-CM | POA: Diagnosis not present

## 2023-12-12 DIAGNOSIS — M6281 Muscle weakness (generalized): Secondary | ICD-10-CM

## 2023-12-12 DIAGNOSIS — M25552 Pain in left hip: Secondary | ICD-10-CM | POA: Diagnosis not present

## 2023-12-12 NOTE — Therapy (Signed)
OUTPATIENT PHYSICAL THERAPY LUMBAR EVALUATION   Patient Name: Deanna Floyd MRN: [id number] DOB:09-26-1961, 63 y.o., female Today's Date: 12/12/2023  END OF SESSION:  PT End of Session - 12/12/23 1137     Visit Number 2    Number of Visits 4    Date for PT Re-Evaluation 12/27/23    PT Start Time 1137    PT Stop Time 1215    PT Time Calculation (min) 38 min    Activity Tolerance Patient tolerated treatment well    Behavior During Therapy WFL for tasks assessed/performed              Past Medical History:  Diagnosis Date   GERD (gastroesophageal reflux disease)    Hypertension    Stroke (HCC) 2007   Past Surgical History:  Procedure Laterality Date   FOOT SURGERY  2015   left side had 2 surgeries in 2015   TRIGGER FINGER RELEASE  2010   Patient Active Problem List   Diagnosis Date Noted   Acute pain of left knee 01/24/2023   Left hip pain 01/24/2023   Axillary lump, left 09/17/2022   Solitary pulmonary nodule 06/17/2022   Generalized abdominal pain 11/12/2021   Loss of weight 11/12/2021   Right wrist pain 03/27/2020   Neuropathy 08/21/2019   Chronic foot pain, left 08/21/2019   Seasonal allergies 08/21/2019   Right upper quadrant pain 05/08/2019   Gastroesophageal reflux disease with esophagitis 05/08/2019   Hot flashes due to menopause 05/08/2019   Carpal tunnel syndrome on both sides 11/19/2016   Metabolic syndrome 11/19/2016   Essential hypertension 11/19/2016   Allergic dermatitis 11/19/2016   Allergy status to unspecified drugs, medicaments and biological substances status 11/19/2016   BP check 09/22/2016   Post-operative state 06/02/2016   Obesity 01/08/2016   Low back pain 10/06/2015   Pain in joint, ankle and foot 10/06/2015    PCP: Ardyth Gal, NP  REFERRING PROVIDER: London Sheer, MD  REFERRING DIAG: Low back pain, unspecified back pain laterality, unspecified chronicity, unspecified whether sciatica present [M54.50]    Rationale for Evaluation and Treatment: Rehabilitation  THERAPY DIAG:  Other low back pain  Muscle weakness (generalized)  Pain in left hip  Other abnormalities of gait and mobility  ONSET DATE: 5 days  SUBJECTIVE:                                                                                                                                                                                           SUBJECTIVE STATEMENT: Pt stated feeling better overall today, however is a little tender as she has been running around a  lot over the past week while looking for an apartment. Pt stated she is not compliant with current HEP due to time constraints, however is trying to roll out the hip as educated with a tennis ball occasionally. Currently has no pain in the hip/lowback, pain is present behind the knee near hamstrings attachment.  PERTINENT HISTORY:  See problem list, no pertinent hx reported.  PAIN:  Are you having pain? Yes: NPRS scale: 3/10 Pain location: low back, behind L knee Pain description: like a charlie horse, pulling Aggravating factors: prolonged standing  Relieving factors: medication, hot bath  PRECAUTIONS: None  RED FLAGS: None   WEIGHT BEARING RESTRICTIONS: No  FALLS:  Has patient fallen in last 6 months? No  LIVING ENVIRONMENT: Lives with: lives with their family Lives in: House/apartment Stairs: No Has following equipment at home: None  OCCUPATION: retired  PLOF: Independent  PATIENT GOALS: just get around better, stand longer, get rid of the pain  NEXT MD VISIT: 6 weeks from referring encounter ~Feb.  OBJECTIVE:  Note: Objective measures were completed at Evaluation unless otherwise noted.  PATIENT SURVEYS:  Modified Oswestry 23/50   COGNITION: Overall cognitive status: Within functional limits for tasks assessed     SENSATION: WFL  MUSCLE LENGTH: Hamstrings: Right -10 deg; Left -10 deg Thomas test:  POSTURE: rounded shoulders and  forward head  PALPATION: Significant tenderness to piriformis muscle belly, and posterior knee   Lumbar contraction pattern  L Multifidus: WFL  R Multifidus:WFL  LUMBAR ROM:   AROM eval  Flexion   Extension   Right lateral flexion   Left lateral flexion   Right rotation   Left rotation    (Blank rows = not tested)  ! Indicates pain with testing  LOWER EXTREMITY ROM:     Passive  Right eval Left eval  Hip flexion [facility]  Hip extension [facility]! WFL!  Hip abduction [facility]! WFL!  Hip adduction    Hip internal rotation [facility]! WFL!  Hip external rotation [facility]! WFL!  Knee flexion [facility]  Knee extension [facility]  Ankle dorsiflexion    Ankle plantarflexion    Ankle inversion    Ankle eversion     (Blank rows = not tested)  ! Indicates pain with testing  LOWER EXTREMITY MMT:    MMT Right eval Left eval  Hip flexion 4 4  Hip extension 4 4  Hip abduction  4  Hip adduction    Hip internal rotation    Hip external rotation    Knee flexion    Knee extension    Ankle dorsiflexion    Ankle plantarflexion    Ankle inversion    Ankle eversion     (Blank rows = not tested)   ! Indicates pain with testing LUMBAR SPECIAL TESTS:  Straight leg raise test: Positive  GAIT: Distance walked: 65ft Assistive device utilized: None Level of assistance: Complete Independence Comments:   [facility] Adult PT Treatment:(pt arrived late)                                         DATE: 12/12/2023  Therapeutic Exercise: Nu step 5' SL seated Hamstring curl Manual Therapy: Hamstring, popliteus, proximal gastroc release  Self Care: Education regarding anatomy, prognosis, and HEP review.  PATIENT EDUCATION:  Education details: Pt received education regarding HEP performance, ADL performance, functional activity tolerance, impairment education, appropriate performance of  therapeutic activities.  Person educated: Patient Education method: Explanation, Demonstration, Tactile cues, Verbal cues, and Handouts Education comprehension: verbalized understanding and returned demonstration  HOME EXERCISE PROGRAM: Access Code: [email or web address] URL: [email or web address] Date: 12/06/2023 Prepared by: Sheliah Plane  Exercises - Supine Piriformis Stretch with Foot on Ground  - 1 x daily - 7 x weekly - 2 sets - 1 reps - 1' hold - Piriformis Mobilization with Small Ball  - 1 x daily - 4-7 x weekly - 1-2 sets - 1 reps - 5' hold - Supine Bridge  - 1 x daily - 4-5 x weekly - 3 sets - 10 reps - 4s hold - Clamshell with Resistance  - 1 x daily - 4-5 x weekly - 3 sets - 10 reps - 4s hold  ASSESSMENT:  CLINICAL IMPRESSION: Pt attended physical therapy session 7 minutes late for continuation of treatment regarding L hip with radiating pain. Today's treatment focused on stability and strength throughout knee/hip ROM, reducing resting tone in hamstrings and popliteus, and improving activity tolerance. Pt showed great tolerance to treatment and demonstrated improvement with hip strength, mobility, and reported activity tolerance. Some difficulties continued with . Pt required minimal cues  for safe and appropriate performance of today's activity. Continue with therapeutic focus on reducing posterior chain resting tone, activity tolerance and stability In end range motion.  Eval impression (12/06/2023): Pt. attended today's physical therapy session for evaluation of low back pain with radiating pain to L knee. Pt has complaints of a charlie horse feeling behind the L knee, pain in the back and highly sensitve hip girdle. Pt has notable deficits with hip strength, sciatic nerve motility, Hip mobility and piriformis tenderness.  Signs and symptoms are concurrent with piriformis syndrome. Pt would benefit from therapeutic focus on piriformis motility, Hip girdle strengthening in pain  free ROM, standing tolerance, and core activation patterns for stabilizing lumbar spine.  Pt demonstrated great understanding of education provided and required minimal cues for appropriate performance with today's activities.  Pt requires the intervention of skilled outpatient physical therapy to address the aforementioned deficits and progress towards a functional level in line with therapeutic goals.   OBJECTIVE IMPAIRMENTS: Abnormal gait, decreased activity tolerance, decreased mobility, difficulty walking, decreased strength, impaired flexibility, impaired tone, and pain .   ACTIVITY LIMITATIONS: carrying, lifting, sitting, standing, sleeping, toileting, and caring for others  PARTICIPATION LIMITATIONS: meal prep, cleaning, driving, shopping, community activity, and yard work  PERSONAL FACTORS: Age and Fitness are also affecting patient's functional outcome.   REHAB POTENTIAL: Good  CLINICAL DECISION MAKING: Stable/uncomplicated  EVALUATION COMPLEXITY: Low   GOALS: Goals reviewed with patient? YES  SHORT TERM GOALS: Target date: 12/20/2023  Pt will be independent with administered HEP to demonstrate the competency necessary for long term managemnet of symptoms at home. Baseline: Goal status: INITIAL    LONG TERM GOALS: Target date: 12/27/2023  Pt. Will achieve a ODI score of 11/50 as to demonstrate improvement in self-perceived functional ability with daily activities.  Baseline: 23/50 Goal status: INITIAL  2.  Pt will improve MMT score for hip extension to a 5/5 to demonstrate improvement in strength for quality of motion and activity performance.  Baseline:  Goal status: INITIAL  PLAN:  PT FREQUENCY: 1x/week  PT DURATION: 3 weeks  PLANNED INTERVENTIONS: 97110-Therapeutic exercises, 97530- Therapeutic activity, [id number]- Neuromuscular re-education, 97535- Self Care, [id number]- Manual therapy,  16109- Gait training, Balance training, Stair training, Dry Needling, Joint  mobilization, and Moist heat.  PLAN FOR NEXT SESSION: review HEP, progress with POC as laid out in assessment   Sheliah Plane, PT, DPT 12/12/2023, 12:22 PM

## 2023-12-19 ENCOUNTER — Ambulatory Visit: Payer: 59 | Admitting: Physical Therapy

## 2023-12-19 DIAGNOSIS — M6281 Muscle weakness (generalized): Secondary | ICD-10-CM

## 2023-12-19 DIAGNOSIS — M5459 Other low back pain: Secondary | ICD-10-CM | POA: Diagnosis not present

## 2023-12-19 DIAGNOSIS — R2689 Other abnormalities of gait and mobility: Secondary | ICD-10-CM | POA: Diagnosis not present

## 2023-12-19 DIAGNOSIS — M25552 Pain in left hip: Secondary | ICD-10-CM | POA: Diagnosis not present

## 2023-12-19 NOTE — Therapy (Signed)
 OUTPATIENT PHYSICAL THERAPY LUMBAR EVALUATION   Patient Name: Deanna Floyd MRN: [id number] DOB:Nov 06, 1961, 63 y.o., female Today's Date: 12/19/2023  END OF SESSION:  PT End of Session - 12/19/23 1132     Visit Number 3    Number of Visits 4    Date for PT Re-Evaluation 12/27/23    PT Start Time 1132    PT Stop Time 1210    PT Time Calculation (min) 38 min    Activity Tolerance Patient tolerated treatment well    Behavior During Therapy WFL for tasks assessed/performed               Past Medical History:  Diagnosis Date   GERD (gastroesophageal reflux disease)    Hypertension    Stroke (HCC) 2007   Past Surgical History:  Procedure Laterality Date   FOOT SURGERY  2015   left side had 2 surgeries in 2015   TRIGGER FINGER RELEASE  2010   Patient Active Problem List   Diagnosis Date Noted   Acute pain of left knee 01/24/2023   Left hip pain 01/24/2023   Axillary lump, left 09/17/2022   Solitary pulmonary nodule 06/17/2022   Generalized abdominal pain 11/12/2021   Loss of weight 11/12/2021   Right wrist pain 03/27/2020   Neuropathy 08/21/2019   Chronic foot pain, left 08/21/2019   Seasonal allergies 08/21/2019   Right upper quadrant pain 05/08/2019   Gastroesophageal reflux disease with esophagitis 05/08/2019   Hot flashes due to menopause 05/08/2019   Carpal tunnel syndrome on both sides 11/19/2016   Metabolic syndrome 11/19/2016   Essential hypertension 11/19/2016   Allergic dermatitis 11/19/2016   Allergy status to unspecified drugs, medicaments and biological substances status 11/19/2016   BP check 09/22/2016   Post-operative state 06/02/2016   Obesity 01/08/2016   Low back pain 10/06/2015   Pain in joint, ankle and foot 10/06/2015    PCP: Michail Africa, NP  REFERRING PROVIDER: Diedra Fowler, MD  REFERRING DIAG: Low back pain, unspecified back pain laterality, unspecified chronicity, unspecified whether sciatica present [M54.50]    Rationale for Evaluation and Treatment: Rehabilitation  THERAPY DIAG:  Other low back pain  Muscle weakness (generalized)  Other abnormalities of gait and mobility  ONSET DATE: 5 days  SUBJECTIVE:                                                                                                                                                                                           SUBJECTIVE STATEMENT: Pt stated that the original pain in the back and leg is good, pretty much gone. but feels a bit bruised following  manual therapy last session. Feeling good overall though  PERTINENT HISTORY:  See problem list, no pertinent hx reported.  PAIN:  Are you having pain? Yes: NPRS scale: 3/10 Pain location: low back, behind L knee Pain description: like a charlie horse, pulling Aggravating factors: prolonged standing  Relieving factors: medication, hot bath  PRECAUTIONS: None  RED FLAGS: None   WEIGHT BEARING RESTRICTIONS: No  FALLS:  Has patient fallen in last 6 months? No  LIVING ENVIRONMENT: Lives with: lives with their family Lives in: House/apartment Stairs: No Has following equipment at home: None  OCCUPATION: retired  PLOF: Independent  PATIENT GOALS: just get around better, stand longer, get rid of the pain  NEXT MD VISIT: 6 weeks from referring encounter ~Feb.  OBJECTIVE:  Note: Objective measures were completed at Evaluation unless otherwise noted.  PATIENT SURVEYS:  Modified Oswestry 23/50   COGNITION: Overall cognitive status: Within functional limits for tasks assessed     SENSATION: WFL  MUSCLE LENGTH: Hamstrings: Right -10 deg; Left -10 deg Thomas test:  POSTURE: rounded shoulders and forward head  PALPATION: Significant tenderness to piriformis muscle belly, and posterior knee   Lumbar contraction pattern  L Multifidus: WFL  R Multifidus:WFL  LUMBAR ROM:   AROM eval  Flexion   Extension   Right lateral flexion   Left lateral  flexion   Right rotation   Left rotation    (Blank rows = not tested)  ! Indicates pain with testing  LOWER EXTREMITY ROM:     Passive  Right eval Left eval  Hip flexion [facility]  Hip extension [facility]! WFL!  Hip abduction [facility]! WFL!  Hip adduction    Hip internal rotation [facility]! WFL!  Hip external rotation [facility]! WFL!  Knee flexion [facility]  Knee extension [facility]  Ankle dorsiflexion    Ankle plantarflexion    Ankle inversion    Ankle eversion     (Blank rows = not tested)  ! Indicates pain with testing  LOWER EXTREMITY MMT:    MMT Right eval Left eval  Hip flexion 4 4  Hip extension 4 4  Hip abduction  4  Hip adduction    Hip internal rotation    Hip external rotation    Knee flexion    Knee extension    Ankle dorsiflexion    Ankle plantarflexion    Ankle inversion    Ankle eversion     (Blank rows = not tested)   ! Indicates pain with testing LUMBAR SPECIAL TESTS:  Straight leg raise test: Positive  GAIT: Distance walked: 6ft Assistive device utilized: None Level of assistance: Complete Independence Comments:  [facility] Adult PT Treatment:                                                DATE: 12/19/2023  Therapeutic Exercise: Nu step 8' Therapeutic Activity: Supine hamstring stretch 2x1' ea. Supine TFL stretch 2x1' ea. Elevated SLS with heel tap Self Care: Long term HEP review   [facility] Adult PT Treatment:(pt arrived late)                                         DATE: 12/12/2023  Therapeutic Exercise: Nu step 5' SL seated Hamstring curl Manual Therapy: Hamstring,  popliteus, proximal gastroc release  Self Care: Education regarding anatomy, prognosis, and HEP review.                                                                                                                           PATIENT EDUCATION:  Education details: Pt received education regarding HEP performance, ADL performance, functional activity tolerance, impairment education, appropriate  performance of therapeutic activities.  Person educated: Patient Education method: Explanation, Demonstration, Tactile cues, Verbal cues, and Handouts Education comprehension: verbalized understanding and returned demonstration  HOME EXERCISE PROGRAM: Access Code: [email or web address] URL: [email or web address] Date: 12/06/2023 Prepared by: Albesa Huguenin  Exercises - Supine Piriformis Stretch with Foot on Ground  - 1 x daily - 7 x weekly - 2 sets - 1 reps - 1' hold - Piriformis Mobilization with Small Ball  - 1 x daily - 4-7 x weekly - 1-2 sets - 1 reps - 5' hold - Supine Bridge  - 1 x daily - 4-5 x weekly - 3 sets - 10 reps - 4s hold - Clamshell with Resistance  - 1 x daily - 4-5 x weekly - 3 sets - 10 reps - 4s hold  ASSESSMENT:  CLINICAL IMPRESSION: Pt attended physical therapy session for continuation of treatment regarding L hip/knee pain. Today's treatment focused on improvement of  improving motility of hip/knee musculature and improve function in stance and prolonged ambulation. Pt showed  great tolerance to treatment and demonstrated improvement with activity tolerance, Hamstring motility and Glute med stability in stance. Some difficulties continued with form during step downs. Pt required moderate vocal/tactile cuing as well as minimal assistance for safe and appropriate performance of today's interventions. Re-evaluate at next session   Eval impression (12/06/2023): Pt. attended today's physical therapy session for evaluation of low back pain with radiating pain to L knee. Pt has complaints of a charlie horse feeling behind the L knee, pain in the back and highly sensitve hip girdle. Pt has notable deficits with hip strength, sciatic nerve motility, Hip mobility and piriformis tenderness.  Signs and symptoms are concurrent with piriformis syndrome. Pt would benefit from therapeutic focus on piriformis motility, Hip girdle strengthening in pain free ROM, standing tolerance, and  core activation patterns for stabilizing lumbar spine.  Pt demonstrated great understanding of education provided and required minimal cues for appropriate performance with today's activities.  Pt requires the intervention of skilled outpatient physical therapy to address the aforementioned deficits and progress towards a functional level in line with therapeutic goals.   OBJECTIVE IMPAIRMENTS: Abnormal gait, decreased activity tolerance, decreased mobility, difficulty walking, decreased strength, impaired flexibility, impaired tone, and pain .   ACTIVITY LIMITATIONS: carrying, lifting, sitting, standing, sleeping, toileting, and caring for others  PARTICIPATION LIMITATIONS: meal prep, cleaning, driving, shopping, community activity, and yard work  PERSONAL FACTORS: Age and Fitness are also affecting patient's functional outcome.   REHAB POTENTIAL: Good  CLINICAL DECISION MAKING: Stable/uncomplicated  EVALUATION COMPLEXITY: Low   GOALS: Goals reviewed with  patient? YES  SHORT TERM GOALS: Target date: 12/20/2023  Pt will be independent with administered HEP to demonstrate the competency necessary for long term managemnet of symptoms at home. Baseline: Goal status: INITIAL    LONG TERM GOALS: Target date: 12/27/2023  Pt. Will achieve a ODI score of 11/50 as to demonstrate improvement in self-perceived functional ability with daily activities.  Baseline: 23/50 Goal status: INITIAL  2.  Pt will improve MMT score for hip extension to a 5/5 to demonstrate improvement in strength for quality of motion and activity performance.  Baseline:  Goal status: INITIAL  PLAN:  PT FREQUENCY: 1x/week  PT DURATION: 3 weeks  PLANNED INTERVENTIONS: 97110-Therapeutic exercises, 97530- Therapeutic activity, [id number]- Neuromuscular re-education, 97535- Self Care, 65784- Manual therapy, [id number]- Gait training, Balance training, Stair training, Dry Needling, Joint mobilization, and Moist heat.  PLAN  FOR NEXT SESSION: review HEP, progress with POC as laid out in assessment   Albesa Huguenin, PT, DPT 12/19/2023, 12:07 PM

## 2023-12-27 ENCOUNTER — Ambulatory Visit: Payer: 59 | Admitting: Physical Therapy

## 2023-12-27 DIAGNOSIS — M5459 Other low back pain: Secondary | ICD-10-CM

## 2023-12-27 DIAGNOSIS — M25552 Pain in left hip: Secondary | ICD-10-CM | POA: Diagnosis not present

## 2023-12-27 DIAGNOSIS — R2689 Other abnormalities of gait and mobility: Secondary | ICD-10-CM | POA: Diagnosis not present

## 2023-12-27 DIAGNOSIS — M6281 Muscle weakness (generalized): Secondary | ICD-10-CM

## 2023-12-27 NOTE — Therapy (Signed)
OUTPATIENT PHYSICAL THERAPY LUMBAR TREATMENT AND PROGRESS NOTE  Patient Name: Deanna Floyd MRN: [id number] DOB:[birth year], 63 y.o., female Today's Date: 12/27/2023  END OF SESSION:  PT End of Session - 12/27/23 1155     Visit Number 4    Number of Visits 4    Date for PT Re-Evaluation 12/27/23    PT Start Time 1139    PT Stop Time 1209    PT Time Calculation (min) 30 min             PHYSICAL THERAPY DISCHARGE SUMMARY  Visits from Start of Care: 4  Current functional level related to goals / functional outcomes: All goals met   Remaining deficits: none   Education / Equipment: Long term HEP, prognosis,    Patient agrees to discharge. Patient goals were met. Patient is being discharged due to meeting the stated rehab goals.    Past Medical History:  Diagnosis Date   GERD (gastroesophageal reflux disease)    Hypertension    Stroke (HCC) 2007   Past Surgical History:  Procedure Laterality Date   FOOT SURGERY  2015   left side had 2 surgeries in 2015   TRIGGER FINGER RELEASE  2010   Patient Active Problem List   Diagnosis Date Noted   Acute pain of left knee 01/24/2023   Left hip pain 01/24/2023   Axillary lump, left 09/17/2022   Solitary pulmonary nodule 06/17/2022   Generalized abdominal pain 11/12/2021   Loss of weight 11/12/2021   Right wrist pain 03/27/2020   Neuropathy 08/21/2019   Chronic foot pain, left 08/21/2019   Seasonal allergies 08/21/2019   Right upper quadrant pain 05/08/2019   Gastroesophageal reflux disease with esophagitis 05/08/2019   Hot flashes due to menopause 05/08/2019   Carpal tunnel syndrome on both sides 11/19/2016   Metabolic syndrome 11/19/2016   Essential hypertension 11/19/2016   Allergic dermatitis 11/19/2016   Allergy status to unspecified drugs, medicaments and biological substances status 11/19/2016   BP check 09/22/2016   Post-operative state 06/02/2016   Obesity 01/08/2016   Low back pain 10/06/2015   Pain  in joint, ankle and foot 10/06/2015    PCP: Ardyth Gal, NP  REFERRING PROVIDER: London Sheer, MD  REFERRING DIAG: Low back pain, unspecified back pain laterality, unspecified chronicity, unspecified whether sciatica present [M54.50]   Rationale for Evaluation and Treatment: Rehabilitation  THERAPY DIAG:  Other low back pain  Muscle weakness (generalized)  Other abnormalities of gait and mobility  ONSET DATE: 5 days  SUBJECTIVE:  SUBJECTIVE STATEMENT: Feeling good today, daughter helped supply equipment for HEP.  Feels like the pain is not like it used to be and wants to blame weight on residual pain levels.   PERTINENT HISTORY:  See problem list, no pertinent hx reported.  PAIN:  Are you having pain? Yes: NPRS scale: 3/10 Pain location: low back, behind L knee Pain description: like a charlie horse, pulling Aggravating factors: prolonged standing  Relieving factors: medication, hot bath  PRECAUTIONS: None  RED FLAGS: None   WEIGHT BEARING RESTRICTIONS: No  FALLS:  Has patient fallen in last 6 months? No  LIVING ENVIRONMENT: Lives with: lives with their family Lives in: House/apartment Stairs: No Has following equipment at home: None  OCCUPATION: retired  PLOF: Independent  PATIENT GOALS: just get around better, stand longer, get rid of the pain  NEXT MD VISIT: 6 weeks from referring encounter ~Feb.  OBJECTIVE:  Note: Objective measures were completed at Evaluation unless otherwise noted.  PATIENT SURVEYS:  Modified Oswestry 23/50 (46%) 12/27/2023:  11/50 (22%)  COGNITION: Overall cognitive status: Within functional limits for tasks assessed     SENSATION: WFL  MUSCLE LENGTH: Hamstrings: Right -10 deg; Left -10 deg Thomas test:  POSTURE: rounded  shoulders and forward head  PALPATION: Significant tenderness to piriformis muscle belly, and posterior knee   Lumbar contraction pattern  L Multifidus: WFL  R Multifidus:WFL  LUMBAR ROM:   AROM eval  Flexion   Extension   Right lateral flexion   Left lateral flexion   Right rotation   Left rotation    (Blank rows = not tested)  ! Indicates pain with testing  LOWER EXTREMITY ROM:     Passive  Right eval Left eval  Hip flexion [facility]  Hip extension [facility]! WFL!  Hip abduction [facility]! WFL!  Hip adduction    Hip internal rotation [facility]! WFL!  Hip external rotation [facility]! WFL!  Knee flexion [facility]  Knee extension [facility]  Ankle dorsiflexion    Ankle plantarflexion    Ankle inversion    Ankle eversion     (Blank rows = not tested)  ! Indicates pain with testing  LOWER EXTREMITY MMT:    MMT Right eval 12/27/2023 Left eval 12/27/2023  Hip flexion 4 5 4 5   Hip extension 4 5 4 5   Hip abduction   4 5  Hip adduction      Hip internal rotation      Hip external rotation      Knee flexion      Knee extension      Ankle dorsiflexion      Ankle plantarflexion      Ankle inversion      Ankle eversion       (Blank rows = not tested)   ! Indicates pain with testing LUMBAR SPECIAL TESTS:  Straight leg raise test: Positive  GAIT: Distance walked: 79ft Assistive device utilized: None Level of assistance: Complete Independence Comments:   [facility] Adult PT Treatment:  (pt attended 9 minutes late)                                  DATE: 12/27/2023   Therapeutic Activity: Re-evaluative measures Self Care: Long term HEP review POC discussion  [facility] Adult PT Treatment:  DATE: 12/19/2023  Therapeutic Exercise: Nu step 8' Therapeutic Activity: Supine hamstring stretch 2x1' ea. Supine TFL stretch 2x1' ea. Elevated SLS with heel tap Self Care: Long term HEP review                                                                                                                      PATIENT EDUCATION:  Education details: Pt received education regarding HEP performance, ADL performance, functional activity tolerance, impairment education, appropriate performance of therapeutic activities.  Person educated: Patient Education method: Explanation, Demonstration, Tactile cues, Verbal cues, and Handouts Education comprehension: verbalized understanding and returned demonstration  HOME EXERCISE PROGRAM: Access Code: [email or web address] URL: [email or web address] Date: 12/27/2023 Prepared by: Sheliah Plane  Exercises - Supine Piriformis Stretch with Foot on Ground  - 1 x daily - 7 x weekly - 2 sets - 1 reps - 1' hold - Supine Bridge  - 1 x daily - 4-5 x weekly - 3 sets - 10 reps - 4s hold - Supine Hamstring Stretch with Strap  - 1 x daily - 7 x weekly - 2 sets - 2 reps - 42m hold - Lateral Step Down  - 1 x daily - 7 x weekly - 2-3 sets - 8-15 reps - ITB Stretch at Wall  - 1 x daily - 7 x weekly - 1-2 sets - 2 reps - 56m hold  ASSESSMENT:  CLINICAL IMPRESSION: Pt attended physical therapy session 9 minutes late for re-evaluation of LBP with radiating pain into L knee. Pt has made significant progress in all areas of current POC. Pt has met  all therapeutic  goals and is confident in maintaining current functional level at home with a long term HEP.  Pt required no cuing as well as no assistance for safe and appropriate performance of today's activities. Pt is to be discharge at completion of today's session    Eval impression (12/06/2023): Pt. attended today's physical therapy session for evaluation of low back pain with radiating pain to L knee. Pt has complaints of a charlie horse feeling behind the L knee, pain in the back and highly sensitve hip girdle. Pt has notable deficits with hip strength, sciatic nerve motility, Hip mobility and piriformis tenderness.  Signs and symptoms are concurrent with piriformis syndrome. Pt  would benefit from therapeutic focus on piriformis motility, Hip girdle strengthening in pain free ROM, standing tolerance, and core activation patterns for stabilizing lumbar spine.  Pt demonstrated great understanding of education provided and required minimal cues for appropriate performance with today's activities.  Pt requires the intervention of skilled outpatient physical therapy to address the aforementioned deficits and progress towards a functional level in line with therapeutic goals.   OBJECTIVE IMPAIRMENTS: Abnormal gait, decreased activity tolerance, decreased mobility, difficulty walking, decreased strength, impaired flexibility, impaired tone, and pain .   ACTIVITY LIMITATIONS: carrying, lifting, sitting, standing, sleeping, toileting, and caring for others  PARTICIPATION LIMITATIONS: meal prep, cleaning, driving, shopping, community activity, and yard work  PERSONAL FACTORS: Age  and Fitness are also affecting patient's functional outcome.   REHAB POTENTIAL: Good  CLINICAL DECISION MAKING: Stable/uncomplicated  EVALUATION COMPLEXITY: Low   GOALS: Goals reviewed with patient? YES  SHORT TERM GOALS: Target date: 12/20/2023  Pt will be independent with administered HEP to demonstrate the competency necessary for long term managemnet of symptoms at home. Baseline: Goal status: MET 12/27/2023    LONG TERM GOALS: Target date: 12/27/2023  Pt. Will achieve a ODI score of 11/50 as to demonstrate improvement in self-perceived functional ability with daily activities.  Baseline: 23/50 Goal status: MET 12/27/2023  2.  Pt will improve MMT score for hip extension to a 5/5 to demonstrate improvement in strength for quality of motion and activity performance.  Baseline:  Goal status: MET 12/27/2023  PLAN:  PT FREQUENCY: 1x/week  PT DURATION: 3 weeks  PLANNED INTERVENTIONS: 97110-Therapeutic exercises, 97530- Therapeutic activity, 97112- Neuromuscular re-education, 97535-  Self Care, [id number]- Manual therapy, [phone]- Gait training, Balance training, Stair training, Dry Needling, Joint mobilization, and Moist heat.  PLAN FOR NEXT SESSION: review HEP, progress with POC as laid out in assessment   Sheliah Plane, PT, DPT 12/27/2023, 12:09 PM

## 2023-12-30 ENCOUNTER — Other Ambulatory Visit: Payer: Self-pay

## 2024-01-02 ENCOUNTER — Ambulatory Visit (INDEPENDENT_AMBULATORY_CARE_PROVIDER_SITE_OTHER): Payer: 59 | Admitting: Orthopedic Surgery

## 2024-01-02 DIAGNOSIS — M4316 Spondylolisthesis, lumbar region: Secondary | ICD-10-CM

## 2024-01-02 NOTE — Progress Notes (Signed)
 Orthopedic Spine Surgery Office Note   Assessment: Patient is a 63 y.o. female with low back pain that radiates into bilateral buttocks. On the left, it radiates along the posterior aspect of the thigh as well. Suspect radiculopathy     Plan: -Patient has tried PT, meloxicam, ibuprofen, gabapentin, medrol dosepak, flexeril, percocet -Patient is doing much better since she was last seen.  She says PT has been helpful.  She has been doing a home exercise program.  I told her to keep doing the home exercise program -I encouraged her to continue to work on weight loss.  She asked about getting into the gym.  I told her that that would be a good idea -Patient should return to office on an as-needed basis     Patient expressed understanding of the plan and all questions were answered to the patient's satisfaction.    ___________________________________________________________________________     History:   Patient is a 63 y.o. female who presents today for follow-up on her lumbar spine.  Patient has been doing well since she was last seen in the office.  She has been working with physical therapy.  She has been doing a home exercise program.  She says she has noticed significant improvement.  Her pain is not nearly as bad as it was when she was last seen in the office.  She said her pain is currently tolerable.  She has not developed any new symptoms since she was last seen.    Treatments tried: PT, meloxicam, ibuprofen, gabapentin, medrol dosepak, flexeril, percocet    Physical Exam:   General: no acute distress, appears stated age Neurologic: alert, answering questions appropriately, following commands Respiratory: unlabored breathing on room air, symmetric chest rise Psychiatric: appropriate affect, normal cadence to speech     MSK (spine):   -Strength exam                                                   Left                  Right EHL                              -/5                    5/5 TA                                 5/5                  5/5 GSC                             5/5                  5/5 Knee extension            5/5                  5/5 Hip flexion                    5/5  5/5   Unable to extend left great toe since prior foot surgery   -Sensory exam                           Sensation intact to light touch in L2-S1 nerve distributions of bilateral lower extremities   -Achilles DTR: 2/4 on the left, 2/4 on the right -Patellar tendon DTR: 2/4 on the left, 2/4 on the right  Imaging: XRs of the lumbar spine from 11/21/2023 were previously independently reviewed and interpreted, showing L4/5 spondylolisthesis that shifts about 2mm between flexion and extension views. Disc height loss at L4/5 and L5/S1. No other significant degenerative changes seen. No fracture or dislocation seen.      Patient name: Deanna Floyd Patient MRN: [id number] Date of visit: 01/02/24

## 2024-01-03 ENCOUNTER — Other Ambulatory Visit (HOSPITAL_BASED_OUTPATIENT_CLINIC_OR_DEPARTMENT_OTHER): Payer: Self-pay

## 2024-01-03 ENCOUNTER — Encounter: Payer: 59 | Admitting: Physical Therapy

## 2024-01-03 ENCOUNTER — Other Ambulatory Visit: Payer: Self-pay

## 2024-01-05 ENCOUNTER — Other Ambulatory Visit: Payer: Self-pay

## 2024-01-18 ENCOUNTER — Ambulatory Visit (INDEPENDENT_AMBULATORY_CARE_PROVIDER_SITE_OTHER): Payer: Self-pay | Admitting: Nurse Practitioner

## 2024-01-18 ENCOUNTER — Encounter: Payer: Self-pay | Admitting: Nurse Practitioner

## 2024-01-18 ENCOUNTER — Other Ambulatory Visit: Payer: Self-pay

## 2024-01-18 VITALS — BP 127/84 | HR 99 | Temp 97.9°F | Wt 214.0 lb

## 2024-01-18 DIAGNOSIS — I10 Essential (primary) hypertension: Secondary | ICD-10-CM

## 2024-01-18 DIAGNOSIS — Z760 Encounter for issue of repeat prescription: Secondary | ICD-10-CM | POA: Diagnosis not present

## 2024-01-18 DIAGNOSIS — M5442 Lumbago with sciatica, left side: Secondary | ICD-10-CM | POA: Diagnosis not present

## 2024-01-18 DIAGNOSIS — M5441 Lumbago with sciatica, right side: Secondary | ICD-10-CM

## 2024-01-18 DIAGNOSIS — Z1211 Encounter for screening for malignant neoplasm of colon: Secondary | ICD-10-CM | POA: Diagnosis not present

## 2024-01-18 DIAGNOSIS — M25562 Pain in left knee: Secondary | ICD-10-CM

## 2024-01-18 DIAGNOSIS — G8929 Other chronic pain: Secondary | ICD-10-CM

## 2024-01-18 DIAGNOSIS — Z13220 Encounter for screening for lipoid disorders: Secondary | ICD-10-CM

## 2024-01-18 MED ORDER — MELOXICAM 7.5 MG PO TABS
7.5000 mg | ORAL_TABLET | Freq: Two times a day (BID) | ORAL | 2 refills | Status: AC | PRN
  Filled 2024-01-18: qty 30, 15d supply, fill #0

## 2024-01-18 MED ORDER — IBUPROFEN 800 MG PO TABS
800.0000 mg | ORAL_TABLET | Freq: Three times a day (TID) | ORAL | 6 refills | Status: DC | PRN
  Filled 2024-01-18: qty 30, 10d supply, fill #0
  Filled 2024-02-06: qty 30, 10d supply, fill #1
  Filled 2024-03-15: qty 30, 10d supply, fill #2
  Filled 2024-04-13: qty 30, 10d supply, fill #3
  Filled 2024-05-08: qty 30, 10d supply, fill #4
  Filled 2024-06-01: qty 30, 10d supply, fill #5
  Filled 2024-06-25: qty 30, 10d supply, fill #6

## 2024-01-18 MED ORDER — AMLODIPINE BESYLATE 5 MG PO TABS
5.0000 mg | ORAL_TABLET | Freq: Every day | ORAL | 3 refills | Status: AC
  Filled 2024-01-18 – 2024-02-06 (×3): qty 90, 90d supply, fill #0
  Filled 2024-05-08: qty 90, 90d supply, fill #1
  Filled 2024-07-23: qty 90, 90d supply, fill #2
  Filled 2024-11-03: qty 90, 90d supply, fill #3

## 2024-01-18 MED ORDER — CYCLOBENZAPRINE HCL 10 MG PO TABS
10.0000 mg | ORAL_TABLET | Freq: Three times a day (TID) | ORAL | 6 refills | Status: AC | PRN
  Filled 2024-01-18: qty 15, 5d supply, fill #0
  Filled 2024-02-02: qty 15, 5d supply, fill #1

## 2024-01-18 NOTE — Patient Instructions (Signed)
 1. Lipid screening (Primary)   2. Medication refill  - cyclobenzaprine (FLEXERIL) 10 MG tablet; Take 1 tablet (10 mg total) by mouth 3 (three) times daily as needed for muscle spasms.  Dispense: 15 tablet; Refill: 6 - amLODipine (NORVASC) 5 MG tablet; Take 1 tablet (5 mg total) by mouth daily.  Dispense: 90 tablet; Refill: 3  3. Chronic bilateral low back pain with bilateral sciatica  - cyclobenzaprine (FLEXERIL) 10 MG tablet; Take 1 tablet (10 mg total) by mouth 3 (three) times daily as needed for muscle spasms.  Dispense: 15 tablet; Refill: 6  4. Essential hypertension  - amLODipine (NORVASC) 5 MG tablet; Take 1 tablet (5 mg total) by mouth daily.  Dispense: 90 tablet; Refill: 3  5. Acute pain of left knee  - ibuprofen (ADVIL) 800 MG tablet; Take 1 tablet (800 mg total) by mouth every 8 (eight) hours as needed.  Dispense: 30 tablet; Refill: 6  6. Colon cancer screening  - Ambulatory referral to Gastroenterology

## 2024-01-18 NOTE — Progress Notes (Signed)
 Subjective   Patient ID: Deanna Floyd, female    DOB: [birth year], 63 y.o.   MRN: [id number]  Chief Complaint  Patient presents with   Medical Management of Chronic Issues    Referring provider: Ivonne Andrew, NP  Deanna Floyd is a 63 y.o. female with Past Medical History: No date: GERD (gastroesophageal reflux disease) No date: Hypertension 2007: Stroke [facility])   HPI  Hypertension: Patient here for follow-up of elevated blood pressure. She is exercising and is adherent to low salt diet.  Blood pressure is well controlled at home. Cardiac symptoms none. Patient denies none.  Cardiovascular risk factors: hypertension and obesity (BMI >= 30 kg/m2). Use of agents associated with hypertension: none. History of target organ damage: none. B/P at home similar to today's.  Needs refills today.  Denies f/c/s, n/v/d, hemoptysis, PND, leg swelling Denies chest pain or edema    Allergies  Allergen Reactions   Hydrocortisone Hives   Lyrica [Pregabalin] Rash    Immunization History  Administered Date(s) Administered   Influenza, Seasonal, Injecte, Preservative Fre 07/29/2023   Influenza,inj,Quad PF,6+ Mos 10/06/2015, 08/06/2016, 07/04/2017, 08/01/2018, 08/21/2019, 12/21/2021   PFIZER(Purple Top)SARS-COV-2 Vaccination 04/12/2020, 05/03/2020, 04/08/2021    Tobacco History: Social History   Tobacco Use  Smoking Status Former   Current packs/day: 0.00   Average packs/day: 0.3 packs/day for 10.0 years (2.5 ttl pk-yrs)   Types: Cigarettes   Start date: 2002   Quit date: 2012   Years since quitting: 13.2  Smokeless Tobacco Never   Counseling given: Not Answered   Outpatient Encounter Medications as of 01/18/2024  Medication Sig   diclofenac Sodium (VOLTAREN) 1 % GEL Apply 4 g topically 4 (four) times daily.   dorzolamide-timolol (COSOPT) 2-0.5 % ophthalmic solution Instill 1 drop into both eyes twice a day   dorzolamide-timolol (COSOPT) 2-0.5 % ophthalmic solution Place 1  drop into both eyes 2 (two) times daily.   dorzolamide-timolol (COSOPT) 2-0.5 % ophthalmic solution Instill 1 drop into both eyes twice a day   gabapentin (NEURONTIN) 300 MG capsule Take 1 capsule (300 mg total) by mouth 3 (three) times daily.   Latanoprostene Bunod (VYZULTA) 0.024 % SOLN Place 1 drop into both eyes every evening.   Latanoprostene Bunod (VYZULTA) 0.024 % SOLN Place 1 drop into both eyes every evening.   lisinopril-hydrochlorothiazide (ZESTORETIC) 20-12.5 MG tablet Take 1 tablet by mouth daily.   Multiple Vitamin (MULTIVITAMIN WITH MINERALS) TABS tablet Take 1 tablet by mouth daily.   omeprazole (PRILOSEC) 40 MG capsule Take 1 capsule (40 mg total) by mouth daily.   oxyCODONE-acetaminophen (PERCOCET/ROXICET) 5-325 MG tablet Take 1 tablet by mouth every 6 (six) hours as needed for severe pain (pain score 7-10).   [DISCONTINUED] amLODipine (NORVASC) 5 MG tablet Take 1 tablet (5 mg total) by mouth daily.   [DISCONTINUED] cyclobenzaprine (FLEXERIL) 10 MG tablet Take 1 tablet (10 mg total) by mouth 3 (three) times daily as needed for muscle spasms.   [DISCONTINUED] ibuprofen (ADVIL) 800 MG tablet Take 1 tablet (800 mg total) by mouth every 8 (eight) hours as needed.   [DISCONTINUED] meloxicam (MOBIC) 7.5 MG tablet Take 1 tablet (7.5 mg total) by mouth 2 (two) times daily as needed for pain.   amLODipine (NORVASC) 5 MG tablet Take 1 tablet (5 mg total) by mouth daily.   cyclobenzaprine (FLEXERIL) 10 MG tablet Take 1 tablet (10 mg total) by mouth 3 (three) times daily as needed for muscle spasms.   hydrOXYzine (ATARAX) 10 MG tablet Take  1 tablet (10 mg total) by mouth at bedtime. (Patient not taking: Reported on 01/18/2024)   ibuprofen (ADVIL) 800 MG tablet Take 1 tablet (800 mg total) by mouth every 8 (eight) hours as needed.   Latanoprostene Bunod (VYZULTA) 0.024 % SOLN Place 1 drop into both eyes every evening.   Latanoprostene Bunod (VYZULTA) 0.024 % SOLN Place 1 drop into both eyes  every evening.   meloxicam (MOBIC) 15 MG tablet Take 1 tablet (15 mg total) by mouth daily. (Patient not taking: Reported on 01/18/2024)   meloxicam (MOBIC) 7.5 MG tablet Take 1 tablet (7.5 mg total) by mouth 2 (two) times daily as needed for pain.   methylPREDNISolone (MEDROL DOSEPAK) 4 MG TBPK tablet Take as prescribed on the box (Patient not taking: Reported on 01/18/2024)   mupirocin ointment (BACTROBAN) 2 % Apply 1 Application topically 2 (two) times daily. (Patient not taking: Reported on 01/18/2024)   No facility-administered encounter medications on file as of 01/18/2024.    Review of Systems  Review of Systems  Constitutional: Negative.   HENT: Negative.    Cardiovascular: Negative.   Gastrointestinal: Negative.   Allergic/Immunologic: Negative.   Neurological: Negative.   Psychiatric/Behavioral: Negative.       Objective:   BP 127/84   Pulse 99   Temp 97.9 F (36.6 C) (Oral)   Wt 214 lb (97.1 kg)   SpO2 98%   BMI 39.14 kg/m   Wt Readings from Last 5 Encounters:  01/18/24 214 lb (97.1 kg)  11/21/23 206 lb (93.4 kg)  10/20/23 205 lb 12.8 oz (93.4 kg)  10/09/23 204 lb (92.5 kg)  07/29/23 202 lb (91.6 kg)     Physical Exam Vitals and nursing note reviewed.  Constitutional:      General: She is not in acute distress.    Appearance: She is well-developed.  Cardiovascular:     Rate and Rhythm: Normal rate and regular rhythm.  Pulmonary:     Effort: Pulmonary effort is normal.     Breath sounds: Normal breath sounds.  Neurological:     Mental Status: She is alert and oriented to person, place, and time.       Assessment & Plan:   Lipid screening  Medication refill -     Cyclobenzaprine HCl; Take 1 tablet (10 mg total) by mouth 3 (three) times daily as needed for muscle spasms.  Dispense: 15 tablet; Refill: 6 -     amLODIPine Besylate; Take 1 tablet (5 mg total) by mouth daily.  Dispense: 90 tablet; Refill: 3  Chronic bilateral low back pain with  bilateral sciatica -     Cyclobenzaprine HCl; Take 1 tablet (10 mg total) by mouth 3 (three) times daily as needed for muscle spasms.  Dispense: 15 tablet; Refill: 6  Essential hypertension -     amLODIPine Besylate; Take 1 tablet (5 mg total) by mouth daily.  Dispense: 90 tablet; Refill: 3  Acute pain of left knee -     Ibuprofen; Take 1 tablet (800 mg total) by mouth every 8 (eight) hours as needed.  Dispense: 30 tablet; Refill: 6  Colon cancer screening -     Ambulatory referral to Gastroenterology  Other orders -     Meloxicam; Take 1 tablet (7.5 mg total) by mouth 2 (two) times daily as needed for pain.  Dispense: 30 tablet; Refill: 2     Return in about 4 weeks (around 02/15/2024) for pap and lipid screen.   Ivonne Andrew, NP 01/18/2024

## 2024-01-26 ENCOUNTER — Ambulatory Visit: Payer: Self-pay | Admitting: Nurse Practitioner

## 2024-02-02 ENCOUNTER — Other Ambulatory Visit: Payer: Self-pay

## 2024-02-06 ENCOUNTER — Other Ambulatory Visit: Payer: Self-pay

## 2024-02-10 ENCOUNTER — Other Ambulatory Visit: Payer: Self-pay

## 2024-03-05 ENCOUNTER — Other Ambulatory Visit: Payer: Self-pay

## 2024-03-05 ENCOUNTER — Encounter: Payer: Self-pay | Admitting: Nurse Practitioner

## 2024-03-05 ENCOUNTER — Ambulatory Visit (INDEPENDENT_AMBULATORY_CARE_PROVIDER_SITE_OTHER): Payer: Self-pay | Admitting: Nurse Practitioner

## 2024-03-05 VITALS — BP 146/83 | HR 100 | Temp 97.0°F | Wt 219.0 lb

## 2024-03-05 DIAGNOSIS — Z13220 Encounter for screening for lipoid disorders: Secondary | ICD-10-CM | POA: Diagnosis not present

## 2024-03-05 DIAGNOSIS — I10 Essential (primary) hypertension: Secondary | ICD-10-CM

## 2024-03-05 DIAGNOSIS — D229 Melanocytic nevi, unspecified: Secondary | ICD-10-CM

## 2024-03-05 MED ORDER — DORZOLAMIDE HCL-TIMOLOL MAL 2-0.5 % OP SOLN
1.0000 [drp] | Freq: Two times a day (BID) | OPHTHALMIC | 1 refills | Status: AC
  Filled 2024-03-05: qty 10, 50d supply, fill #0
  Filled 2024-04-25: qty 10, 50d supply, fill #1

## 2024-03-05 NOTE — Progress Notes (Signed)
 Subjective   Patient ID: Deanna Floyd, female    DOB: [birth year], 63 y.o.   MRN: [id number]  Chief Complaint  Patient presents with   Medical Management of Chronic Issues    Referring provider: Jerrlyn Morel, NP  Deanna Floyd is a 63 y.o. female with Past Medical History: No date: GERD (gastroesophageal reflux disease) No date: Hypertension 2007: Stroke [facility])   HPI  Hypertension: Patient here for follow-up of elevated blood pressure. She is exercising and is adherent to low salt diet.  Blood pressure is well controlled at home. Cardiac symptoms none. Patient denies none.  Cardiovascular risk factors: hypertension and obesity (BMI >= 30 kg/m2). Use of agents associated with hypertension: none. History of target organ damage: none. B/P at home similar to today's.  Needs refills today.  Denies f/c/s, n/v/d, hemoptysis, PND, leg swelling Denies chest pain or edema.      Allergies  Allergen Reactions   Hydrocortisone Hives   Lyrica  [Pregabalin ] Rash    Immunization History  Administered Date(s) Administered   Influenza, Seasonal, Injecte, Preservative Fre 07/29/2023   Influenza,inj,Quad PF,6+ Mos 10/06/2015, 08/06/2016, 07/04/2017, 08/01/2018, 08/21/2019, 12/21/2021   PFIZER(Purple Top)SARS-COV-2 Vaccination 04/12/2020, 05/03/2020, 04/08/2021    Tobacco History: Social History   Tobacco Use  Smoking Status Former   Current packs/day: 0.00   Average packs/day: 0.3 packs/day for 10.0 years (2.5 ttl pk-yrs)   Types: Cigarettes   Start date: 2002   Quit date: 2012   Years since quitting: 13.3  Smokeless Tobacco Never   Counseling given: Not Answered   Outpatient Encounter Medications as of 03/05/2024  Medication Sig   amLODipine  (NORVASC ) 5 MG tablet Take 1 tablet (5 mg total) by mouth daily.   diclofenac  Sodium (VOLTAREN ) 1 % GEL Apply 4 g topically 4 (four) times daily.   dorzolamide -timolol  (COSOPT ) 2-0.5 % ophthalmic solution Instill 1 drop into both  eyes twice a day   ibuprofen  (ADVIL ) 800 MG tablet Take 1 tablet (800 mg total) by mouth every 8 (eight) hours as needed.   Latanoprostene Bunod  (VYZULTA ) 0.024 % SOLN Place 1 drop into both eyes every evening.   lisinopril -hydrochlorothiazide  (ZESTORETIC ) 20-12.5 MG tablet Take 1 tablet by mouth daily.   Multiple Vitamin (MULTIVITAMIN WITH MINERALS) TABS tablet Take 1 tablet by mouth daily.   omeprazole  (PRILOSEC) 40 MG capsule Take 1 capsule (40 mg total) by mouth daily.   OVER THE COUNTER MEDICATION Take 4 capsules by mouth daily.   [DISCONTINUED] dorzolamide -timolol  (COSOPT ) 2-0.5 % ophthalmic solution Place 1 drop into both eyes 2 (two) times daily.   cyclobenzaprine  (FLEXERIL ) 10 MG tablet Take 1 tablet (10 mg total) by mouth 3 (three) times daily as needed for muscle spasms. (Patient not taking: Reported on 03/05/2024)   dorzolamide -timolol  (COSOPT ) 2-0.5 % ophthalmic solution Instill 1 drop into both eyes twice a day   dorzolamide -timolol  (COSOPT ) 2-0.5 % ophthalmic solution Place 1 drop into both eyes 2 (two) times daily.   gabapentin  (NEURONTIN ) 300 MG capsule Take 1 capsule (300 mg total) by mouth 3 (three) times daily.   hydrOXYzine  (ATARAX ) 10 MG tablet Take 1 tablet (10 mg total) by mouth at bedtime. (Patient not taking: Reported on 03/05/2024)   Latanoprostene Bunod  (VYZULTA ) 0.024 % SOLN Place 1 drop into both eyes every evening.   Latanoprostene Bunod  (VYZULTA ) 0.024 % SOLN Place 1 drop into both eyes every evening.   Latanoprostene Bunod  (VYZULTA ) 0.024 % SOLN Place 1 drop into both eyes every evening.   meloxicam  (MOBIC ) 15  MG tablet Take 1 tablet (15 mg total) by mouth daily. (Patient not taking: Reported on 03/05/2024)   meloxicam  (MOBIC ) 7.5 MG tablet Take 1 tablet (7.5 mg total) by mouth 2 (two) times daily as needed for pain. (Patient not taking: Reported on 03/05/2024)   methylPREDNISolone  (MEDROL  DOSEPAK) 4 MG TBPK tablet Take as prescribed on the box (Patient not taking:  Reported on 03/05/2024)   mupirocin  ointment (BACTROBAN ) 2 % Apply 1 Application topically 2 (two) times daily. (Patient not taking: Reported on 03/05/2024)   oxyCODONE -acetaminophen  (PERCOCET/ROXICET) 5-325 MG tablet Take 1 tablet by mouth every 6 (six) hours as needed for severe pain (pain score 7-10). (Patient not taking: Reported on 03/05/2024)   No facility-administered encounter medications on file as of 03/05/2024.    Review of Systems  Review of Systems  Constitutional: Negative.   HENT: Negative.    Cardiovascular: Negative.   Gastrointestinal: Negative.   Allergic/Immunologic: Negative.   Neurological: Negative.   Psychiatric/Behavioral: Negative.       Objective:   BP (!) 146/83   Pulse 100   Temp (!) 97 F (36.1 C)   Wt 219 lb (99.3 kg)   SpO2 100%   BMI 40.06 kg/m   Wt Readings from Last 5 Encounters:  03/05/24 219 lb (99.3 kg)  01/18/24 214 lb (97.1 kg)  11/21/23 206 lb (93.4 kg)  10/20/23 205 lb 12.8 oz (93.4 kg)  10/09/23 204 lb (92.5 kg)     Physical Exam Vitals and nursing note reviewed.  Constitutional:      General: She is not in acute distress.    Appearance: She is well-developed.  Cardiovascular:     Rate and Rhythm: Normal rate and regular rhythm.  Pulmonary:     Effort: Pulmonary effort is normal.     Breath sounds: Normal breath sounds.  Neurological:     Mental Status: She is alert and oriented to person, place, and time.       Assessment & Plan:   Lipid screening -     Lipid panel  Essential hypertension -     CBC -     Comprehensive metabolic panel with GFR  Atypical mole -     Ambulatory referral to Dermatology  Other orders -     Dorzolamide  HCl-Timolol  Mal; Place 1 drop into both eyes 2 (two) times daily.  Dispense: 10 mL; Refill: 1     Return in about 6 months (around 09/04/2024).   Jerrlyn Morel, NP 03/05/2024

## 2024-03-05 NOTE — Patient Instructions (Signed)
 1. Lipid screening (Primary)  - Lipid Panel  2. Essential hypertension  - CBC - Comprehensive metabolic panel with GFR

## 2024-03-06 ENCOUNTER — Other Ambulatory Visit: Payer: Self-pay

## 2024-03-06 LAB — COMPREHENSIVE METABOLIC PANEL WITH GFR
ALT: 22 IU/L (ref 0–32)
AST: 19 IU/L (ref 0–40)
Albumin: 4.2 g/dL (ref 3.9–4.9)
Alkaline Phosphatase: 90 IU/L (ref 44–121)
BUN/Creatinine Ratio: 17 (ref 12–28)
BUN: 14 mg/dL (ref 8–27)
Bilirubin Total: 0.3 mg/dL (ref 0.0–1.2)
CO2: 25 mmol/L (ref 20–29)
Calcium: 9.4 mg/dL (ref 8.7–10.3)
Chloride: 103 mmol/L (ref 96–106)
Creatinine, Ser: 0.84 mg/dL (ref 0.57–1.00)
Globulin, Total: 3.1 g/dL (ref 1.5–4.5)
Glucose: 94 mg/dL (ref 70–99)
Potassium: 3.4 mmol/L — ABNORMAL LOW (ref 3.5–5.2)
Sodium: 143 mmol/L (ref 134–144)
Total Protein: 7.3 g/dL (ref 6.0–8.5)
eGFR: 78 mL/min/{1.73_m2} (ref >59)

## 2024-03-06 LAB — CBC
Hematocrit: 37.6 % (ref 34.0–46.6)
Hemoglobin: 12.3 g/dL (ref 11.1–15.9)
MCH: 32.4 pg (ref 26.6–33.0)
MCHC: 32.7 g/dL (ref 31.5–35.7)
MCV: 99 fL — ABNORMAL HIGH (ref 79–97)
Platelets: 305 10*3/uL (ref 150–450)
RBC: 3.8 x10E6/uL (ref 3.77–5.28)
RDW: 12.4 % (ref 11.7–15.4)
WBC: 6.1 10*3/uL (ref 3.4–10.8)

## 2024-03-06 LAB — LIPID PANEL
Chol/HDL Ratio: 2.2 ratio (ref 0.0–4.4)
Cholesterol, Total: 162 mg/dL (ref 100–199)
HDL: 73 mg/dL (ref >39)
LDL Chol Calc (NIH): 65 mg/dL (ref 0–99)
Triglycerides: 142 mg/dL (ref 0–149)
VLDL Cholesterol Cal: 24 mg/dL (ref 5–40)

## 2024-03-15 ENCOUNTER — Other Ambulatory Visit: Payer: Self-pay

## 2024-03-19 ENCOUNTER — Other Ambulatory Visit: Payer: Self-pay

## 2024-03-20 DIAGNOSIS — H401131 Primary open-angle glaucoma, bilateral, mild stage: Secondary | ICD-10-CM | POA: Diagnosis not present

## 2024-03-20 LAB — HM DIABETES EYE EXAM

## 2024-03-21 ENCOUNTER — Ambulatory Visit: Payer: Self-pay | Admitting: Nurse Practitioner

## 2024-04-13 ENCOUNTER — Other Ambulatory Visit: Payer: Self-pay

## 2024-04-25 ENCOUNTER — Other Ambulatory Visit: Payer: Self-pay

## 2024-04-26 ENCOUNTER — Other Ambulatory Visit: Payer: Self-pay

## 2024-05-08 ENCOUNTER — Other Ambulatory Visit: Payer: Self-pay

## 2024-06-01 ENCOUNTER — Other Ambulatory Visit: Payer: Self-pay

## 2024-06-11 ENCOUNTER — Ambulatory Visit: Admitting: Dermatology

## 2024-06-11 ENCOUNTER — Encounter: Payer: Self-pay | Admitting: Dermatology

## 2024-06-11 ENCOUNTER — Other Ambulatory Visit: Payer: Self-pay

## 2024-06-11 VITALS — BP 125/87 | HR 91

## 2024-06-11 DIAGNOSIS — L820 Inflamed seborrheic keratosis: Secondary | ICD-10-CM

## 2024-06-11 DIAGNOSIS — L739 Follicular disorder, unspecified: Secondary | ICD-10-CM | POA: Diagnosis not present

## 2024-06-11 DIAGNOSIS — L821 Other seborrheic keratosis: Secondary | ICD-10-CM | POA: Diagnosis not present

## 2024-06-11 MED ORDER — CLINDAMYCIN PHOSPHATE 1 % EX LOTN
TOPICAL_LOTION | Freq: Every day | CUTANEOUS | 1 refills | Status: AC
  Filled 2024-06-11: qty 60, 30d supply, fill #0
  Filled 2024-11-03: qty 60, 30d supply, fill #1

## 2024-06-11 NOTE — Progress Notes (Signed)
   New Patient Visit   Subjective  Deanna Floyd is a 63 y.o. female who presents for the following: Moles  Patient states she has moles located at the arms back and underneath breast that she would like to have examined. Patient reports the areas have been there for several years. She reports the areas are bothersome.Patient rates irritation 5 out of 10. Patient reports she has not previously been treated for these areas. Patient denies Hx of bx. Patient denies family history of skin cancer(s).  The patient has spots, moles and lesions to be evaluated, some may be new or changing.  The following portions of the chart were reviewed this encounter and updated as appropriate: medications, allergies, medical history  Review of Systems:  No other skin or systemic complaints except as noted in HPI or Assessment and Plan.  Objective  Well appearing patient in no apparent distress; mood and affect are within normal limits.   A focused examination was performed of the following areas: Right upper arm, Underneath breast, back  Relevant exam findings are noted in the Assessment and Plan.                 Left Inframammary Fold, Right Lower Back Inflamed brown stuck on papules  Assessment & Plan   SEBORRHEIC KERATOSIS - Stuck-on, waxy, tan-brown papules and/or plaques  - Benign-appearing - Discussed benign etiology and prognosis. - Observe - Call for any changes  FOLLICULITIS Exam: Perifollicular erythematous papules and pustules  Folliculitis occurs due to inflammation of the superficial hair follicle (pore), resulting in acne-like lesions (pus bumps). It can be infectious (bacterial, fungal) or noninfectious (shaving, tight clothing, heat/sweat, medications).  Folliculitis can be acute or chronic and recommended treatment depends on the underlying cause of folliculitis.  Treatment Plan: - Prescribed Clindamycin  lotion daily until resolved.   FOLLICULITIS   Related  Medications clindamycin  (CLEOCIN -T) 1 % lotion Apply topically daily. INFLAMED SEBORRHEIC KERATOSIS (2) Left Inframammary Fold, Right Lower Back Destruction of lesion - Left Inframammary Fold, Right Lower Back Complexity: simple   Destruction method: cryotherapy   Cryotherapy cycles:  2 Outcome: patient tolerated procedure with difficulty     Return if symptoms worsen or fail to improve.  I, Jetta Ager, am acting as scribe for RUFUS CHRISTELLA HOLY, MD.   Documentation: I have reviewed the above documentation for accuracy and completeness, and I agree with the above.  RUFUS CHRISTELLA HOLY, MD

## 2024-06-11 NOTE — Patient Instructions (Signed)

## 2024-06-12 ENCOUNTER — Other Ambulatory Visit: Payer: Self-pay

## 2024-06-13 ENCOUNTER — Other Ambulatory Visit: Payer: Self-pay

## 2024-06-26 ENCOUNTER — Other Ambulatory Visit: Payer: Self-pay

## 2024-07-23 ENCOUNTER — Other Ambulatory Visit: Payer: Self-pay

## 2024-07-23 ENCOUNTER — Other Ambulatory Visit: Payer: Self-pay | Admitting: Nurse Practitioner

## 2024-07-23 DIAGNOSIS — K219 Gastro-esophageal reflux disease without esophagitis: Secondary | ICD-10-CM

## 2024-07-23 DIAGNOSIS — I10 Essential (primary) hypertension: Secondary | ICD-10-CM

## 2024-07-23 DIAGNOSIS — Z760 Encounter for issue of repeat prescription: Secondary | ICD-10-CM

## 2024-07-23 DIAGNOSIS — M25562 Pain in left knee: Secondary | ICD-10-CM

## 2024-07-23 MED ORDER — LISINOPRIL-HYDROCHLOROTHIAZIDE 20-12.5 MG PO TABS
1.0000 | ORAL_TABLET | Freq: Every day | ORAL | 3 refills | Status: AC
  Filled 2024-07-23: qty 90, 90d supply, fill #0
  Filled 2024-11-03: qty 90, 90d supply, fill #1

## 2024-07-23 MED ORDER — OMEPRAZOLE 40 MG PO CPDR
40.0000 mg | DELAYED_RELEASE_CAPSULE | Freq: Every day | ORAL | 3 refills | Status: AC
  Filled 2024-07-23: qty 90, 90d supply, fill #0
  Filled 2024-11-03: qty 90, 90d supply, fill #1

## 2024-07-23 MED ORDER — IBUPROFEN 800 MG PO TABS
800.0000 mg | ORAL_TABLET | Freq: Three times a day (TID) | ORAL | 6 refills | Status: AC | PRN
  Filled 2024-07-23: qty 30, 10d supply, fill #0
  Filled 2024-08-17: qty 30, 10d supply, fill #1
  Filled 2024-09-07: qty 30, 10d supply, fill #2
  Filled 2024-10-12 – 2024-10-24 (×2): qty 30, 10d supply, fill #3
  Filled 2024-11-21: qty 30, 10d supply, fill #4
  Filled 2024-12-13: qty 30, 10d supply, fill #5

## 2024-07-24 ENCOUNTER — Other Ambulatory Visit: Payer: Self-pay

## 2024-07-25 ENCOUNTER — Other Ambulatory Visit: Payer: Self-pay

## 2024-08-14 ENCOUNTER — Ambulatory Visit: Payer: Self-pay

## 2024-08-17 ENCOUNTER — Other Ambulatory Visit: Payer: Self-pay

## 2024-09-05 ENCOUNTER — Ambulatory Visit: Payer: Self-pay | Admitting: Nurse Practitioner

## 2024-09-07 ENCOUNTER — Other Ambulatory Visit: Payer: Self-pay

## 2024-09-10 ENCOUNTER — Encounter: Payer: Self-pay | Admitting: Radiology

## 2024-09-26 ENCOUNTER — Other Ambulatory Visit: Payer: Self-pay

## 2024-09-26 ENCOUNTER — Ambulatory Visit (INDEPENDENT_AMBULATORY_CARE_PROVIDER_SITE_OTHER): Payer: Self-pay | Admitting: Nurse Practitioner

## 2024-09-26 ENCOUNTER — Encounter: Payer: Self-pay | Admitting: Nurse Practitioner

## 2024-09-26 VITALS — BP 135/86 | HR 77 | Wt 208.4 lb

## 2024-09-26 DIAGNOSIS — M25552 Pain in left hip: Secondary | ICD-10-CM | POA: Diagnosis not present

## 2024-09-26 DIAGNOSIS — G8929 Other chronic pain: Secondary | ICD-10-CM

## 2024-09-26 DIAGNOSIS — M25531 Pain in right wrist: Secondary | ICD-10-CM | POA: Diagnosis not present

## 2024-09-26 DIAGNOSIS — M1990 Unspecified osteoarthritis, unspecified site: Secondary | ICD-10-CM | POA: Diagnosis not present

## 2024-09-26 DIAGNOSIS — Z1211 Encounter for screening for malignant neoplasm of colon: Secondary | ICD-10-CM

## 2024-09-26 DIAGNOSIS — I10 Essential (primary) hypertension: Secondary | ICD-10-CM | POA: Diagnosis not present

## 2024-09-26 DIAGNOSIS — Z760 Encounter for issue of repeat prescription: Secondary | ICD-10-CM | POA: Diagnosis not present

## 2024-09-26 DIAGNOSIS — Z124 Encounter for screening for malignant neoplasm of cervix: Secondary | ICD-10-CM

## 2024-09-26 DIAGNOSIS — Z23 Encounter for immunization: Secondary | ICD-10-CM

## 2024-09-26 MED ORDER — MELOXICAM 15 MG PO TABS
15.0000 mg | ORAL_TABLET | Freq: Every day | ORAL | 0 refills | Status: DC
  Filled 2024-09-26: qty 30, 30d supply, fill #0

## 2024-09-26 MED ORDER — DICLOFENAC SODIUM 1 % EX GEL
4.0000 g | Freq: Four times a day (QID) | CUTANEOUS | 6 refills | Status: AC
  Filled 2024-09-26: qty 100, 7d supply, fill #0

## 2024-09-26 NOTE — Addendum Note (Signed)
 Addended by: Darlena Koval R on: 09/26/2024 03:42 PM   Modules accepted: Orders

## 2024-09-26 NOTE — Progress Notes (Signed)
 Subjective   Patient ID: Deanna Floyd, female    DOB: [birth year], 63 y.o.   MRN: [id number]  Chief Complaint  Patient presents with   6 month follow up   Hypertension   Arthritis    Hands and knees, flares started the end of August     Referring provider: Oley Bascom RAMAN, NP  Deanna Floyd is a 63 y.o. female with Past Medical History: No date: GERD (gastroesophageal reflux disease) No date: Hypertension 2007: Stroke [facility])   HPI  Hypertension: Patient here for follow-up of elevated blood pressure. She is exercising and is adherent to low salt diet.  Blood pressure is well controlled at home. Cardiac symptoms none. Patient denies none.  Cardiovascular risk factors: hypertension and obesity (BMI >= 30 kg/m2). Use of agents associated with hypertension: none. History of target organ damage: none. B/P at home similar to today's.  Needs refills today.  Denies f/c/s, n/v/d, hemoptysis, PND, leg swelling. Denies chest pain or edema.   Patient does need a refill on Mobic  and Voltaren  gel for arthritis.  She is overdue for colon cancer screening.  We will order Cologuard today.  Patient does need Pap smear will place referral to OB/GYN today.  Patient will get flu shot in office today.   Allergies  Allergen Reactions   Hydrocortisone Hives   Lyrica  [Pregabalin ] Rash    Immunization History  Administered Date(s) Administered   Influenza, Seasonal, Injecte, Preservative Fre 07/29/2023   Influenza,inj,Quad PF,6+ Mos 10/06/2015, 08/06/2016, 07/04/2017, 08/01/2018, 08/21/2019, 12/21/2021   PFIZER(Purple Top)SARS-COV-2 Vaccination 04/12/2020, 05/03/2020, 04/08/2021    Tobacco History: Social History   Tobacco Use  Smoking Status Former   Current packs/day: 0.00   Average packs/day: 0.3 packs/day for 10.0 years (2.5 ttl pk-yrs)   Types: Cigarettes   Start date: 2002   Quit date: 2012   Years since quitting: 13.8  Smokeless Tobacco Never   Counseling given: Not  Answered   Outpatient Encounter Medications as of 09/26/2024  Medication Sig   amLODipine  (NORVASC ) 5 MG tablet Take 1 tablet (5 mg total) by mouth daily.   dorzolamide -timolol  (COSOPT ) 2-0.5 % ophthalmic solution Instill 1 drop into both eyes twice a day   dorzolamide -timolol  (COSOPT ) 2-0.5 % ophthalmic solution Instill 1 drop into both eyes twice a day   dorzolamide -timolol  (COSOPT ) 2-0.5 % ophthalmic solution Place 1 drop into both eyes 2 (two) times daily.   ibuprofen  (ADVIL ) 800 MG tablet Take 1 tablet (800 mg total) by mouth every 8 (eight) hours as needed.   Latanoprostene Bunod  (VYZULTA ) 0.024 % SOLN Place 1 drop into both eyes every evening.   Latanoprostene Bunod  (VYZULTA ) 0.024 % SOLN Place 1 drop into both eyes every evening.   Latanoprostene Bunod  (VYZULTA ) 0.024 % SOLN Place 1 drop into both eyes every evening.   Latanoprostene Bunod  (VYZULTA ) 0.024 % SOLN Place 1 drop into both eyes every evening.   lisinopril -hydrochlorothiazide  (ZESTORETIC ) 20-12.5 MG tablet Take 1 tablet by mouth daily.   meloxicam  (MOBIC ) 7.5 MG tablet Take 1 tablet (7.5 mg total) by mouth 2 (two) times daily as needed for pain.   Multiple Vitamin (MULTIVITAMIN WITH MINERALS) TABS tablet Take 1 tablet by mouth daily.   mupirocin  ointment (BACTROBAN ) 2 % Apply 1 Application topically 2 (two) times daily.   omeprazole  (PRILOSEC) 40 MG capsule Take 1 capsule (40 mg total) by mouth daily.   OVER THE COUNTER MEDICATION Take 4 capsules by mouth daily.   [DISCONTINUED] diclofenac  Sodium (VOLTAREN ) 1 % GEL Apply  4 g topically 4 (four) times daily.   [DISCONTINUED] meloxicam  (MOBIC ) 15 MG tablet Take 1 tablet (15 mg total) by mouth daily.   clindamycin  (CLEOCIN -T) 1 % lotion Apply topically daily. (Patient not taking: Reported on 09/26/2024)   cyclobenzaprine  (FLEXERIL ) 10 MG tablet Take 1 tablet (10 mg total) by mouth 3 (three) times daily as needed for muscle spasms. (Patient not taking: Reported on 09/26/2024)    diclofenac  Sodium (VOLTAREN ) 1 % GEL Apply 4 g topically 4 (four) times daily.   gabapentin  (NEURONTIN ) 300 MG capsule Take 1 capsule (300 mg total) by mouth 3 (three) times daily. (Patient not taking: Reported on 09/26/2024)   hydrOXYzine  (ATARAX ) 10 MG tablet Take 1 tablet (10 mg total) by mouth at bedtime. (Patient not taking: Reported on 09/26/2024)   meloxicam  (MOBIC ) 15 MG tablet Take 1 tablet (15 mg total) by mouth daily.   methylPREDNISolone  (MEDROL  DOSEPAK) 4 MG TBPK tablet Take as prescribed on the box (Patient not taking: Reported on 09/26/2024)   oxyCODONE -acetaminophen  (PERCOCET/ROXICET) 5-325 MG tablet Take 1 tablet by mouth every 6 (six) hours as needed for severe pain (pain score 7-10). (Patient not taking: Reported on 09/26/2024)   No facility-administered encounter medications on file as of 09/26/2024.    Review of Systems  Review of Systems  Constitutional: Negative.   HENT: Negative.    Cardiovascular: Negative.   Gastrointestinal: Negative.   Allergic/Immunologic: Negative.   Neurological: Negative.   Psychiatric/Behavioral: Negative.       Objective:   BP 135/86 (BP Location: Left Arm, Patient Position: Sitting, Cuff Size: Large)   Pulse 77   Wt 208 lb 6.4 oz (94.5 kg)   SpO2 98%   BMI 38.12 kg/m   Wt Readings from Last 5 Encounters:  09/26/24 208 lb 6.4 oz (94.5 kg)  03/05/24 219 lb (99.3 kg)  01/18/24 214 lb (97.1 kg)  11/21/23 206 lb (93.4 kg)  10/20/23 205 lb 12.8 oz (93.4 kg)     Physical Exam Vitals and nursing note reviewed.  Constitutional:      General: She is not in acute distress.    Appearance: She is well-developed.  Cardiovascular:     Rate and Rhythm: Normal rate and regular rhythm.  Pulmonary:     Effort: Pulmonary effort is normal.     Breath sounds: Normal breath sounds.  Neurological:     Mental Status: She is alert and oriented to person, place, and time.       Assessment & Plan:   Arthritis -     Meloxicam ; Take 1  tablet (15 mg total) by mouth daily.  Dispense: 30 tablet; Refill: 0 -     Diclofenac  Sodium; Apply 4 g topically 4 (four) times daily.  Dispense: 100 g; Refill: 6  Left hip pain -     Meloxicam ; Take 1 tablet (15 mg total) by mouth daily.  Dispense: 30 tablet; Refill: 0  Medication refill -     Diclofenac  Sodium; Apply 4 g topically 4 (four) times daily.  Dispense: 100 g; Refill: 6  Chronic pain of right wrist -     Diclofenac  Sodium; Apply 4 g topically 4 (four) times daily.  Dispense: 100 g; Refill: 6  Essential hypertension -     Comprehensive metabolic panel with GFR  Cervical cancer screening -     Ambulatory referral to Obstetrics / Gynecology  Colon cancer screening -     Cologuard     Return in about 6 months (around 03/26/2025).  Bascom GORMAN Borer, NP 09/26/2024

## 2024-09-27 ENCOUNTER — Ambulatory Visit: Payer: Self-pay | Admitting: Nurse Practitioner

## 2024-09-27 LAB — COMPREHENSIVE METABOLIC PANEL WITH GFR
ALT: 15 IU/L (ref 0–32)
AST: 16 IU/L (ref 0–40)
Albumin: 4.4 g/dL (ref 3.9–4.9)
Alkaline Phosphatase: 66 IU/L (ref 49–135)
BUN/Creatinine Ratio: 17 (ref 12–28)
BUN: 11 mg/dL (ref 8–27)
Bilirubin Total: 0.6 mg/dL (ref 0.0–1.2)
CO2: 24 mmol/L (ref 20–29)
Calcium: 9.5 mg/dL (ref 8.7–10.3)
Chloride: 100 mmol/L (ref 96–106)
Creatinine, Ser: 0.66 mg/dL (ref 0.57–1.00)
Globulin, Total: 2.4 g/dL (ref 1.5–4.5)
Glucose: 101 mg/dL — ABNORMAL HIGH (ref 70–99)
Potassium: 3.5 mmol/L (ref 3.5–5.2)
Sodium: 139 mmol/L (ref 134–144)
Total Protein: 6.8 g/dL (ref 6.0–8.5)
eGFR: 98 mL/min/1.73 (ref >59)

## 2024-10-05 ENCOUNTER — Other Ambulatory Visit: Payer: Self-pay

## 2024-10-12 ENCOUNTER — Other Ambulatory Visit: Payer: Self-pay

## 2024-10-23 ENCOUNTER — Ambulatory Visit: Payer: Self-pay

## 2024-10-23 ENCOUNTER — Other Ambulatory Visit: Payer: Self-pay

## 2024-10-23 VITALS — BP 135/86 | Ht 62.0 in | Wt 211.0 lb

## 2024-10-23 DIAGNOSIS — Z0000 Encounter for general adult medical examination without abnormal findings: Secondary | ICD-10-CM

## 2024-10-23 NOTE — Progress Notes (Signed)
 I connected with  Julian Pesa on 10/23/2024 by a audio enabled telemedicine application and verified that I am speaking with the correct person using two identifiers.  Patient Location: Home  Provider Location: Home Office  Persons Participating in Visit: Patient.  I discussed the limitations of evaluation and management by telemedicine. The patient expressed understanding and agreed to proceed.  Vital Signs: Because this visit was a virtual/telehealth visit, some criteria may be missing or patient reported. Any vitals not documented were not able to be obtained and vitals that have been documented are patient reported.  Chief Complaint  Patient presents with   Medicare Wellness     Subjective:   Deanna Floyd is a 63 y.o. female who presents for a Medicare Annual Wellness Visit.  Visit info / Clinical Intake: Medicare Wellness Visit Type:: Subsequent Annual Wellness Visit Persons participating in visit and providing information:: patient Medicare Wellness Visit Mode:: Telephone If telephone:: video declined Since this visit was completed virtually, some vitals may be partially provided or unavailable. Missing vitals are due to the limitations of the virtual format.: Documented vitals are patient reported If Telephone or Video please confirm:: I connected with patient using audio/video enable telemedicine. I verified patient identity with two identifiers, discussed telehealth limitations, and patient agreed to proceed. Patient Location:: home Provider Location:: home office Interpreter Needed?: No Pre-visit prep was completed: yes AWV questionnaire completed by patient prior to visit?: no Living arrangements:: with family/others Patient's Overall Health Status Rating: very good Typical amount of pain: some Does pain affect daily life?: no Are you currently prescribed opioids?: no  Dietary Habits and Nutritional Risks How many meals a day?: 2 Eats fruit and vegetables  daily?: yes Most meals are obtained by: preparing own meals In the last 2 weeks, have you had any of the following?: none Diabetic:: no  Functional Status Activities of Daily Living (to include ambulation/medication): Independent Ambulation: Independent Medication Administration: Independent Home Management (perform basic housework or laundry): Independent Manage your own finances?: yes Primary transportation is: family / friends Concerns about vision?: no *vision screening is required for WTM* Concerns about hearing?: no  Fall Screening Falls in the past year?: 0 Number of falls in past year: 0 Was there an injury with Fall?: 0 Fall Risk Category Calculator: 0 Patient Fall Risk Level: Low Fall Risk  Fall Risk Patient at Risk for Falls Due to: No Fall Risks Fall risk Follow up: Falls evaluation completed; Falls prevention discussed  Home and Transportation Safety: All rugs have non-skid backing?: N/A, no rugs All stairs or steps have railings?: yes Grab bars in the bathtub or shower?: yes Have non-skid surface in bathtub or shower?: yes Good home lighting?: yes Regular seat belt use?: yes Hospital stays in the last year:: no  Cognitive Assessment Difficulty concentrating, remembering, or making decisions? : no Will 6CIT or Mini Cog be Completed: yes What year is it?: 0 points What month is it?: 0 points Give patient an address phrase to remember (5 components): remember words apple, table penny About what time is it?: 0 points Count backwards from 20 to 1: 0 points Say the months of the year in reverse: 0 points Repeat the address phrase from earlier: 0 points 6 CIT Score: 0 points  Advance Directives (For Healthcare) Does Patient Have a Medical Advance Directive?: No Would patient like information on creating a medical advance directive?: No - Patient declined  Reviewed/Updated  Reviewed/Updated: Reviewed All (Medical, Surgical, Family, Medications, Allergies,  Care Teams, Patient Goals)  Allergies (verified) Hydrocortisone and Lyrica  [pregabalin ]   Current Medications (verified) Outpatient Encounter Medications as of 10/23/2024  Medication Sig   amLODipine  (NORVASC ) 5 MG tablet Take 1 tablet (5 mg total) by mouth daily.   diclofenac  Sodium (VOLTAREN ) 1 % GEL Apply 4 g topically 4 (four) times daily.   dorzolamide -timolol  (COSOPT ) 2-0.5 % ophthalmic solution Instill 1 drop into both eyes twice a day   dorzolamide -timolol  (COSOPT ) 2-0.5 % ophthalmic solution Instill 1 drop into both eyes twice a day   dorzolamide -timolol  (COSOPT ) 2-0.5 % ophthalmic solution Place 1 drop into both eyes 2 (two) times daily.   ibuprofen  (ADVIL ) 800 MG tablet Take 1 tablet (800 mg total) by mouth every 8 (eight) hours as needed.   Latanoprostene Bunod  (VYZULTA ) 0.024 % SOLN Place 1 drop into both eyes every evening.   Latanoprostene Bunod  (VYZULTA ) 0.024 % SOLN Place 1 drop into both eyes every evening.   Latanoprostene Bunod  (VYZULTA ) 0.024 % SOLN Place 1 drop into both eyes every evening.   Latanoprostene Bunod  (VYZULTA ) 0.024 % SOLN Place 1 drop into both eyes every evening.   lisinopril -hydrochlorothiazide  (ZESTORETIC ) 20-12.5 MG tablet Take 1 tablet by mouth daily.   meloxicam  (MOBIC ) 15 MG tablet Take 1 tablet (15 mg total) by mouth daily.   meloxicam  (MOBIC ) 7.5 MG tablet Take 1 tablet (7.5 mg total) by mouth 2 (two) times daily as needed for pain.   Multiple Vitamin (MULTIVITAMIN WITH MINERALS) TABS tablet Take 1 tablet by mouth daily.   mupirocin  ointment (BACTROBAN ) 2 % Apply 1 Application topically 2 (two) times daily.   omeprazole  (PRILOSEC) 40 MG capsule Take 1 capsule (40 mg total) by mouth daily.   OVER THE COUNTER MEDICATION Take 4 capsules by mouth daily.   clindamycin  (CLEOCIN -T) 1 % lotion Apply topically daily. (Patient not taking: Reported on 10/23/2024)   cyclobenzaprine  (FLEXERIL ) 10 MG tablet Take 1 tablet (10 mg total) by mouth 3 (three)  times daily as needed for muscle spasms. (Patient not taking: Reported on 10/23/2024)   gabapentin  (NEURONTIN ) 300 MG capsule Take 1 capsule (300 mg total) by mouth 3 (three) times daily. (Patient not taking: Reported on 10/23/2024)   hydrOXYzine  (ATARAX ) 10 MG tablet Take 1 tablet (10 mg total) by mouth at bedtime. (Patient not taking: Reported on 10/23/2024)   methylPREDNISolone  (MEDROL  DOSEPAK) 4 MG TBPK tablet Take as prescribed on the box (Patient not taking: Reported on 10/23/2024)   oxyCODONE -acetaminophen  (PERCOCET/ROXICET) 5-325 MG tablet Take 1 tablet by mouth every 6 (six) hours as needed for severe pain (pain score 7-10). (Patient not taking: Reported on 10/23/2024)   No facility-administered encounter medications on file as of 10/23/2024.    History: Past Medical History:  Diagnosis Date   GERD (gastroesophageal reflux disease)    Hypertension    Stroke (HCC) 2007   Past Surgical History:  Procedure Laterality Date   FOOT SURGERY  2015   left side had 2 surgeries in 2015   TRIGGER FINGER RELEASE  2010   Family History  Problem Relation Age of Onset   Hypertension Mother    Hypertension Sister    Breast cancer Sister 70   Colon cancer Neg Hx    Stomach cancer Neg Hx    Social History   Occupational History   Not on file  Tobacco Use   Smoking status: Former    Current packs/day: 0.00    Average packs/day: 0.3 packs/day for 10.0 years (2.5 ttl pk-yrs)    Types: Cigarettes  Start date: 2002    Quit date: 2012    Years since quitting: 13.9   Smokeless tobacco: Never  Vaping Use   Vaping status: Never Used  Substance and Sexual Activity   Alcohol use: Yes    Comment: occ wine    Drug use: No   Sexual activity: Not Currently   Tobacco Counseling Counseling given: Not Answered  SDOH Screenings   Food Insecurity: No Food Insecurity (10/23/2024)  Housing: Low Risk (10/23/2024)  Transportation Needs: Unmet Transportation Needs (10/23/2024)  Utilities:  Not At Risk (10/23/2024)  Alcohol Screen: Low Risk (11/18/2022)  Depression (PHQ2-9): Low Risk (10/23/2024)  Financial Resource Strain: Low Risk (11/18/2022)  Physical Activity: Unknown (10/23/2024)  Social Connections: Moderately Isolated (10/23/2024)  Stress: No Stress Concern Present (10/23/2024)  Tobacco Use: Medium Risk (10/23/2024)  Health Literacy: Adequate Health Literacy (10/23/2024)   See flowsheets for full screening details  Depression Screen PHQ 2 & 9 Depression Scale- Over the past 2 weeks, how often have you been bothered by any of the following problems? Little interest or pleasure in doing things: 0 Feeling down, depressed, or hopeless (PHQ Adolescent also includes...irritable): 0 PHQ-2 Total Score: 0 Trouble falling or staying asleep, or sleeping too much: 2 Feeling tired or having little energy: 0 Poor appetite or overeating (PHQ Adolescent also includes...weight loss): 0 Feeling bad about yourself - or that you are a failure or have let yourself or your family down: 0 Trouble concentrating on things, such as reading the newspaper or watching television (PHQ Adolescent also includes...like school work): 0 Moving or speaking so slowly that other people could have noticed. Or the opposite - being so fidgety or restless that you have been moving around a lot more than usual: 0 Thoughts that you would be better off dead, or of hurting yourself in some way: 0 PHQ-9 Total Score: 2 If you checked off any problems, how difficult have these problems made it for you to do your work, take care of things at home, or get along with other people?: Not difficult at all  Depression Treatment Depression Interventions/Treatment : EYV7-0 Score <4 Follow-up Not Indicated     Goals Addressed             This Visit's Progress    Patient Stated       Patient would like a better living space              Objective:    Today's Vitals   10/23/24 1532  BP: 135/86  Weight:  211 lb (95.7 kg)  Height: 5' 2 (1.575 m)   Body mass index is 38.59 kg/m.  Hearing/Vision screen Hearing Screening - Comments:: No difficulties  Vision Screening - Comments:: Patient wears glasses . Patient has an upcoming appointment with 10/30/2024. DR Cleatus.  Immunizations and Health Maintenance Health Maintenance  Topic Date Due   DTaP/Tdap/Td (1 - Tdap) Never done   Zoster Vaccines- Shingrix (1 of 2) Never done   Pneumococcal Vaccine: 50+ Years (1 of 1 - PCV) Never done   Cervical Cancer Screening (HPV/Pap Cotest)  08/11/2019   Colonoscopy  12/05/2022   Medicare Annual Wellness (AWV)  11/19/2023   COVID-19 Vaccine (4 - 2025-26 season) 07/09/2024   Mammogram  05/05/2025   Influenza Vaccine  Completed   Hepatitis C Screening  Completed   HIV Screening  Completed   Hepatitis B Vaccines 19-59 Average Risk  Aged Out   HPV VACCINES  Aged Out   Meningococcal B Vaccine  Aged Out        Assessment/Plan:  This is a routine wellness examination for [place].  Patient Care Team: Oley Bascom RAMAN, NP as PCP - General (Pulmonary Disease)  I have personally reviewed and noted the following in the patients chart:   Medical and social history Use of alcohol, tobacco or illicit drugs  Current medications and supplements including opioid prescriptions. Functional ability and status Nutritional status Physical activity Advanced directives List of other physicians Hospitalizations, surgeries, and ER visits in previous 12 months Vitals Screenings to include cognitive, depression, and falls Referrals and appointments  No orders of the defined types were placed in this encounter.  In addition, I have reviewed and discussed with patient certain preventive protocols, quality metrics, and best practice recommendations. A written personalized care plan for preventive services as well as general preventive health recommendations were provided to patient.   Lyle MARLA Right,  [place]   10/23/2024   No follow-ups on file.  After Visit Summary: (MyChart) Due to this being a telephonic visit, the after visit summary with patients personalized plan was offered to patient via MyChart   No voiced or noted concerns at this time HM Addressed: patient declined

## 2024-10-23 NOTE — Patient Instructions (Signed)
 Ms. Bobo,  Thank you for taking the time for your Medicare Wellness Visit. I appreciate your continued commitment to your health goals. Please review the care plan we discussed, and feel free to reach out if I can assist you further.  Please note that Annual Wellness Visits do not include a physical exam. Some assessments may be limited, especially if the visit was conducted virtually. If needed, we may recommend an in-person follow-up with your provider.  Ongoing Care Seeing your primary care provider every 3 to 6 months helps us  monitor your health and provide consistent, personalized care.   Referrals If a referral was made during today's visit and you haven't received any updates within two weeks, please contact the referred provider directly to check on the status.  Recommended Screenings:  Health Maintenance  Topic Date Due   DTaP/Tdap/Td vaccine (1 - Tdap) Never done   Zoster (Shingles) Vaccine (1 of 2) Never done   Pneumococcal Vaccine for age over 71 (1 of 1 - PCV) Never done   Pap with HPV screening  08/11/2019   Colon Cancer Screening  12/05/2022   Medicare Annual Wellness Visit  11/19/2023   COVID-19 Vaccine (4 - 2025-26 season) 07/09/2024   Breast Cancer Screening  05/05/2025   Flu Shot  Completed   Hepatitis C Screening  Completed   HIV Screening  Completed   Hepatitis B Vaccine  Aged Out   HPV Vaccine  Aged Out   Meningitis B Vaccine  Aged Out       12/06/2023   11:13 AM  Advanced Directives  Does Patient Have a Medical Advance Directive? No  Would patient like information on creating a medical advance directive? No - Patient declined    Vision: Annual vision screenings are recommended for early detection of glaucoma, cataracts, and diabetic retinopathy. These exams can also reveal signs of chronic conditions such as diabetes and high blood pressure.  Dental: Annual dental screenings help detect early signs of oral cancer, gum disease, and other conditions  linked to overall health, including heart disease and diabetes.  Please see the attached documents for additional preventive care recommendations.

## 2024-10-24 ENCOUNTER — Other Ambulatory Visit: Payer: Self-pay

## 2024-10-30 ENCOUNTER — Other Ambulatory Visit: Payer: Self-pay

## 2024-10-30 ENCOUNTER — Other Ambulatory Visit (HOSPITAL_COMMUNITY): Payer: Self-pay

## 2024-10-30 ENCOUNTER — Other Ambulatory Visit: Payer: Self-pay | Admitting: Nurse Practitioner

## 2024-10-30 DIAGNOSIS — M1990 Unspecified osteoarthritis, unspecified site: Secondary | ICD-10-CM

## 2024-10-30 DIAGNOSIS — M25552 Pain in left hip: Secondary | ICD-10-CM

## 2024-10-30 LAB — COLOGUARD: COLOGUARD: NEGATIVE

## 2024-10-30 MED ORDER — MELOXICAM 15 MG PO TABS
15.0000 mg | ORAL_TABLET | Freq: Every day | ORAL | 0 refills | Status: AC
  Filled 2024-10-30: qty 30, 30d supply, fill #0

## 2024-10-30 MED ORDER — DORZOLAMIDE HCL-TIMOLOL MAL 2-0.5 % OP SOLN
1.0000 [drp] | Freq: Two times a day (BID) | OPHTHALMIC | 4 refills | Status: AC
  Filled 2024-10-30: qty 10, 50d supply, fill #0
  Filled 2024-11-21: qty 10, 100d supply, fill #0

## 2024-10-30 MED ORDER — VYZULTA 0.024 % OP SOLN
1.0000 [drp] | Freq: Every evening | OPHTHALMIC | 4 refills | Status: AC
  Filled 2024-10-30 – 2024-11-21 (×2): qty 5, 50d supply, fill #0

## 2024-11-05 ENCOUNTER — Other Ambulatory Visit: Payer: Self-pay

## 2024-11-09 ENCOUNTER — Other Ambulatory Visit (HOSPITAL_COMMUNITY): Payer: Self-pay

## 2024-11-17 ENCOUNTER — Encounter: Payer: Self-pay | Admitting: Gastroenterology

## 2024-11-21 ENCOUNTER — Other Ambulatory Visit: Payer: Self-pay

## 2024-11-21 ENCOUNTER — Other Ambulatory Visit (HOSPITAL_BASED_OUTPATIENT_CLINIC_OR_DEPARTMENT_OTHER): Payer: Self-pay

## 2024-11-22 ENCOUNTER — Other Ambulatory Visit: Payer: Self-pay

## 2024-12-14 ENCOUNTER — Other Ambulatory Visit: Payer: Self-pay

## 2024-12-25 ENCOUNTER — Encounter (HOSPITAL_BASED_OUTPATIENT_CLINIC_OR_DEPARTMENT_OTHER): Admitting: Obstetrics and Gynecology

## 2025-03-27 ENCOUNTER — Ambulatory Visit: Payer: Self-pay | Admitting: Nurse Practitioner
